# Patient Record
Sex: Female | Born: 1970
Health system: Southern US, Community
[De-identification: ages and names within clinical notes are randomized; demographics above are authoritative.]

## PROBLEM LIST (undated history)

## (undated) DIAGNOSIS — F419 Anxiety disorder, unspecified: Secondary | ICD-10-CM

## (undated) DIAGNOSIS — F329 Major depressive disorder, single episode, unspecified: Secondary | ICD-10-CM

## (undated) DIAGNOSIS — F32A Depression, unspecified: Secondary | ICD-10-CM

## (undated) DIAGNOSIS — E079 Disorder of thyroid, unspecified: Secondary | ICD-10-CM

## (undated) DIAGNOSIS — G43909 Migraine, unspecified, not intractable, without status migrainosus: Secondary | ICD-10-CM

## (undated) DIAGNOSIS — R42 Dizziness and giddiness: Secondary | ICD-10-CM

## (undated) DIAGNOSIS — K589 Irritable bowel syndrome without diarrhea: Secondary | ICD-10-CM

## (undated) HISTORY — PX: ABDOMINAL HYSTERECTOMY: SHX81

## (undated) HISTORY — PX: APPENDECTOMY: SHX54

## (undated) HISTORY — PX: KNEE ARTHROSCOPY: SUR90

## (undated) HISTORY — PX: CHOLECYSTECTOMY: SHX55

---

## 2001-08-25 ENCOUNTER — Emergency Department (HOSPITAL_COMMUNITY): Admission: EM | Admit: 2001-08-25 | Discharge: 2001-08-25 | Payer: Self-pay | Admitting: Internal Medicine

## 2002-08-02 ENCOUNTER — Encounter: Payer: Self-pay | Admitting: Internal Medicine

## 2002-08-02 ENCOUNTER — Ambulatory Visit (HOSPITAL_COMMUNITY): Admission: RE | Admit: 2002-08-02 | Discharge: 2002-08-02 | Payer: Self-pay | Admitting: Internal Medicine

## 2002-08-10 ENCOUNTER — Inpatient Hospital Stay (HOSPITAL_COMMUNITY): Admission: RE | Admit: 2002-08-10 | Discharge: 2002-08-11 | Payer: Self-pay | Admitting: Internal Medicine

## 2004-06-25 ENCOUNTER — Emergency Department (HOSPITAL_COMMUNITY): Admission: EM | Admit: 2004-06-25 | Discharge: 2004-06-25 | Payer: Self-pay | Admitting: Emergency Medicine

## 2004-07-04 ENCOUNTER — Ambulatory Visit (HOSPITAL_COMMUNITY): Admission: RE | Admit: 2004-07-04 | Discharge: 2004-07-04 | Payer: Self-pay | Admitting: Orthopedic Surgery

## 2004-07-22 ENCOUNTER — Encounter (HOSPITAL_COMMUNITY): Admission: RE | Admit: 2004-07-22 | Discharge: 2004-08-21 | Payer: Self-pay | Admitting: Orthopedic Surgery

## 2004-08-27 ENCOUNTER — Ambulatory Visit (HOSPITAL_BASED_OUTPATIENT_CLINIC_OR_DEPARTMENT_OTHER): Admission: RE | Admit: 2004-08-27 | Discharge: 2004-08-27 | Payer: Self-pay | Admitting: Orthopedic Surgery

## 2005-07-15 ENCOUNTER — Emergency Department (HOSPITAL_COMMUNITY): Admission: EM | Admit: 2005-07-15 | Discharge: 2005-07-15 | Payer: Self-pay | Admitting: Emergency Medicine

## 2005-07-30 ENCOUNTER — Emergency Department (HOSPITAL_COMMUNITY): Admission: EM | Admit: 2005-07-30 | Discharge: 2005-07-30 | Payer: Self-pay | Admitting: Emergency Medicine

## 2005-09-11 ENCOUNTER — Encounter (INDEPENDENT_AMBULATORY_CARE_PROVIDER_SITE_OTHER): Payer: Self-pay | Admitting: Internal Medicine

## 2005-11-11 ENCOUNTER — Ambulatory Visit: Payer: Self-pay | Admitting: Internal Medicine

## 2006-11-11 ENCOUNTER — Emergency Department (HOSPITAL_COMMUNITY): Admission: EM | Admit: 2006-11-11 | Discharge: 2006-11-11 | Payer: Self-pay | Admitting: Emergency Medicine

## 2007-07-11 ENCOUNTER — Inpatient Hospital Stay (HOSPITAL_COMMUNITY): Admission: EM | Admit: 2007-07-11 | Discharge: 2007-07-14 | Payer: Self-pay | Admitting: Emergency Medicine

## 2007-07-12 ENCOUNTER — Ambulatory Visit: Payer: Self-pay | Admitting: Cardiology

## 2007-08-15 ENCOUNTER — Emergency Department (HOSPITAL_COMMUNITY): Admission: EM | Admit: 2007-08-15 | Discharge: 2007-08-15 | Payer: Self-pay | Admitting: Emergency Medicine

## 2007-11-30 ENCOUNTER — Inpatient Hospital Stay (HOSPITAL_COMMUNITY): Admission: EM | Admit: 2007-11-30 | Discharge: 2007-12-04 | Payer: Self-pay | Admitting: Emergency Medicine

## 2007-12-01 ENCOUNTER — Ambulatory Visit: Payer: Self-pay | Admitting: Gastroenterology

## 2007-12-03 ENCOUNTER — Ambulatory Visit: Payer: Self-pay | Admitting: Gastroenterology

## 2007-12-04 ENCOUNTER — Inpatient Hospital Stay (HOSPITAL_COMMUNITY): Admission: EM | Admit: 2007-12-04 | Discharge: 2007-12-08 | Payer: Self-pay | Admitting: Emergency Medicine

## 2007-12-05 ENCOUNTER — Ambulatory Visit: Payer: Self-pay | Admitting: Internal Medicine

## 2007-12-05 ENCOUNTER — Ambulatory Visit: Payer: Self-pay | Admitting: Gastroenterology

## 2007-12-06 ENCOUNTER — Encounter: Payer: Self-pay | Admitting: Gastroenterology

## 2007-12-07 ENCOUNTER — Ambulatory Visit: Payer: Self-pay | Admitting: Gastroenterology

## 2007-12-17 ENCOUNTER — Emergency Department (HOSPITAL_COMMUNITY): Admission: EM | Admit: 2007-12-17 | Discharge: 2007-12-17 | Payer: Self-pay | Admitting: Emergency Medicine

## 2007-12-25 ENCOUNTER — Emergency Department (HOSPITAL_COMMUNITY): Admission: EM | Admit: 2007-12-25 | Discharge: 2007-12-26 | Payer: Self-pay | Admitting: Emergency Medicine

## 2007-12-27 ENCOUNTER — Emergency Department (HOSPITAL_COMMUNITY): Admission: EM | Admit: 2007-12-27 | Discharge: 2007-12-28 | Payer: Self-pay | Admitting: Emergency Medicine

## 2007-12-28 ENCOUNTER — Encounter: Payer: Self-pay | Admitting: Gastroenterology

## 2007-12-28 ENCOUNTER — Ambulatory Visit (HOSPITAL_COMMUNITY): Admission: RE | Admit: 2007-12-28 | Discharge: 2007-12-28 | Payer: Self-pay | Admitting: Gastroenterology

## 2007-12-28 ENCOUNTER — Ambulatory Visit: Payer: Self-pay | Admitting: Gastroenterology

## 2007-12-29 ENCOUNTER — Ambulatory Visit (HOSPITAL_COMMUNITY): Admission: RE | Admit: 2007-12-29 | Discharge: 2007-12-29 | Payer: Self-pay | Admitting: Gastroenterology

## 2007-12-29 ENCOUNTER — Ambulatory Visit: Payer: Self-pay | Admitting: Gastroenterology

## 2008-03-28 ENCOUNTER — Emergency Department (HOSPITAL_COMMUNITY): Admission: EM | Admit: 2008-03-28 | Discharge: 2008-03-28 | Payer: Self-pay | Admitting: Emergency Medicine

## 2008-04-13 ENCOUNTER — Emergency Department (HOSPITAL_COMMUNITY): Admission: EM | Admit: 2008-04-13 | Discharge: 2008-04-13 | Payer: Self-pay | Admitting: Emergency Medicine

## 2008-04-23 ENCOUNTER — Ambulatory Visit (HOSPITAL_COMMUNITY): Admission: RE | Admit: 2008-04-23 | Discharge: 2008-04-23 | Payer: Self-pay | Admitting: General Surgery

## 2008-04-23 ENCOUNTER — Encounter (INDEPENDENT_AMBULATORY_CARE_PROVIDER_SITE_OTHER): Payer: Self-pay | Admitting: General Surgery

## 2008-05-19 ENCOUNTER — Emergency Department (HOSPITAL_COMMUNITY): Admission: EM | Admit: 2008-05-19 | Discharge: 2008-05-19 | Payer: Self-pay | Admitting: Emergency Medicine

## 2008-05-29 ENCOUNTER — Emergency Department (HOSPITAL_COMMUNITY): Admission: EM | Admit: 2008-05-29 | Discharge: 2008-05-29 | Payer: Self-pay | Admitting: Emergency Medicine

## 2008-06-28 ENCOUNTER — Emergency Department (HOSPITAL_COMMUNITY): Admission: EM | Admit: 2008-06-28 | Discharge: 2008-06-28 | Payer: Self-pay | Admitting: Emergency Medicine

## 2008-07-19 ENCOUNTER — Emergency Department (HOSPITAL_COMMUNITY): Admission: EM | Admit: 2008-07-19 | Discharge: 2008-07-19 | Payer: Self-pay | Admitting: Emergency Medicine

## 2008-09-10 ENCOUNTER — Emergency Department (HOSPITAL_COMMUNITY): Admission: EM | Admit: 2008-09-10 | Discharge: 2008-09-10 | Payer: Self-pay | Admitting: Emergency Medicine

## 2008-10-27 ENCOUNTER — Emergency Department (HOSPITAL_COMMUNITY): Admission: EM | Admit: 2008-10-27 | Discharge: 2008-10-27 | Payer: Self-pay | Admitting: Internal Medicine

## 2008-12-17 ENCOUNTER — Emergency Department (HOSPITAL_COMMUNITY): Admission: EM | Admit: 2008-12-17 | Discharge: 2008-12-17 | Payer: Self-pay | Admitting: Emergency Medicine

## 2009-08-15 ENCOUNTER — Emergency Department (HOSPITAL_COMMUNITY): Admission: EM | Admit: 2009-08-15 | Discharge: 2009-08-15 | Payer: Self-pay | Admitting: Emergency Medicine

## 2009-09-15 ENCOUNTER — Emergency Department (HOSPITAL_COMMUNITY): Admission: EM | Admit: 2009-09-15 | Discharge: 2009-09-15 | Payer: Self-pay | Admitting: Emergency Medicine

## 2010-03-17 ENCOUNTER — Emergency Department (HOSPITAL_COMMUNITY): Admission: EM | Admit: 2010-03-17 | Discharge: 2010-03-17 | Payer: Self-pay | Admitting: Emergency Medicine

## 2010-03-20 ENCOUNTER — Emergency Department (HOSPITAL_COMMUNITY): Admission: EM | Admit: 2010-03-20 | Discharge: 2010-03-20 | Payer: Self-pay | Admitting: Emergency Medicine

## 2010-07-08 ENCOUNTER — Emergency Department (HOSPITAL_COMMUNITY)
Admission: EM | Admit: 2010-07-08 | Discharge: 2010-07-09 | Payer: Self-pay | Source: Home / Self Care | Admitting: Emergency Medicine

## 2010-07-09 LAB — DIFFERENTIAL
Basophils Absolute: 0.1 10*3/uL (ref 0.0–0.1)
Basophils Relative: 1 % (ref 0–1)
Eosinophils Absolute: 0.3 10*3/uL (ref 0.0–0.7)
Eosinophils Relative: 4 % (ref 0–5)
Lymphocytes Relative: 54 % — ABNORMAL HIGH (ref 12–46)
Lymphs Abs: 4.2 10*3/uL — ABNORMAL HIGH (ref 0.7–4.0)
Monocytes Absolute: 0.5 10*3/uL (ref 0.1–1.0)
Monocytes Relative: 6 % (ref 3–12)
Neutro Abs: 2.8 10*3/uL (ref 1.7–7.7)
Neutrophils Relative %: 36 % — ABNORMAL LOW (ref 43–77)

## 2010-07-09 LAB — URINALYSIS, ROUTINE W REFLEX MICROSCOPIC
Bilirubin Urine: NEGATIVE
Hgb urine dipstick: NEGATIVE
Ketones, ur: NEGATIVE mg/dL
Nitrite: NEGATIVE
Protein, ur: NEGATIVE mg/dL
Specific Gravity, Urine: 1.01 (ref 1.005–1.030)
Urine Glucose, Fasting: NEGATIVE mg/dL
Urobilinogen, UA: 0.2 mg/dL (ref 0.0–1.0)
pH: 7.5 (ref 5.0–8.0)

## 2010-07-09 LAB — CBC
HCT: 36.3 % (ref 36.0–46.0)
Hemoglobin: 13 g/dL (ref 12.0–15.0)
MCH: 32.6 pg (ref 26.0–34.0)
MCHC: 35.8 g/dL (ref 30.0–36.0)
MCV: 91 fL (ref 78.0–100.0)
Platelets: 213 10*3/uL (ref 150–400)
RBC: 3.99 MIL/uL (ref 3.87–5.11)
RDW: 12.9 % (ref 11.5–15.5)
WBC: 7.7 10*3/uL (ref 4.0–10.5)

## 2010-07-09 LAB — COMPREHENSIVE METABOLIC PANEL
ALT: 21 U/L (ref 0–35)
AST: 20 U/L (ref 0–37)
Albumin: 4.3 g/dL (ref 3.5–5.2)
Alkaline Phosphatase: 53 U/L (ref 39–117)
BUN: 15 mg/dL (ref 6–23)
CO2: 26 mEq/L (ref 19–32)
Calcium: 9.8 mg/dL (ref 8.4–10.5)
Chloride: 106 mEq/L (ref 96–112)
Creatinine, Ser: 1.15 mg/dL (ref 0.4–1.2)
GFR calc Af Amer: >60 mL/min (ref >60)
GFR calc non Af Amer: 53 mL/min — ABNORMAL LOW (ref >60)
Glucose, Bld: 93 mg/dL (ref 70–99)
Potassium: 3.9 mEq/L (ref 3.5–5.1)
Sodium: 142 mEq/L (ref 135–145)
Total Bilirubin: 0.6 mg/dL (ref 0.3–1.2)
Total Protein: 6.8 g/dL (ref 6.0–8.3)

## 2010-07-09 LAB — PREGNANCY, URINE: Preg Test, Ur: NEGATIVE

## 2010-07-09 LAB — LIPASE, BLOOD: Lipase: 24 U/L (ref 11–59)

## 2010-09-04 LAB — URINALYSIS, ROUTINE W REFLEX MICROSCOPIC
Glucose, UA: NEGATIVE mg/dL
Leukocytes, UA: NEGATIVE
Protein, ur: NEGATIVE mg/dL
Urobilinogen, UA: 0.2 mg/dL (ref 0.0–1.0)

## 2010-09-04 LAB — CBC
HCT: 36.5 % (ref 36.0–46.0)
Hemoglobin: 12.9 g/dL (ref 12.0–15.0)
WBC: 6.2 10*3/uL (ref 4.0–10.5)

## 2010-09-04 LAB — DIFFERENTIAL
Basophils Absolute: 0 10*3/uL (ref 0.0–0.1)
Lymphocytes Relative: 39 % (ref 12–46)
Monocytes Absolute: 0.4 10*3/uL (ref 0.1–1.0)
Monocytes Relative: 6 % (ref 3–12)
Neutro Abs: 3.1 10*3/uL (ref 1.7–7.7)

## 2010-09-04 LAB — BASIC METABOLIC PANEL
GFR calc non Af Amer: >60 mL/min (ref >60)
Potassium: 3.6 mEq/L (ref 3.5–5.1)
Sodium: 137 mEq/L (ref 135–145)

## 2010-09-04 LAB — URINE CULTURE: Culture: NO GROWTH

## 2010-09-04 LAB — URINE MICROSCOPIC-ADD ON

## 2010-09-10 LAB — BASIC METABOLIC PANEL
BUN: 10 mg/dL (ref 6–23)
Chloride: 106 mEq/L (ref 96–112)
Glucose, Bld: 107 mg/dL — ABNORMAL HIGH (ref 70–99)
Potassium: 4.1 mEq/L (ref 3.5–5.1)

## 2010-09-10 LAB — DIFFERENTIAL
Eosinophils Absolute: 0.2 10*3/uL (ref 0.0–0.7)
Eosinophils Relative: 2 % (ref 0–5)
Lymphs Abs: 2.4 10*3/uL (ref 0.7–4.0)
Monocytes Absolute: 0.5 10*3/uL (ref 0.1–1.0)

## 2010-09-10 LAB — CBC
HCT: 40.5 % (ref 36.0–46.0)
Hemoglobin: 14 g/dL (ref 12.0–15.0)
MCV: 95.8 fL (ref 78.0–100.0)
Platelets: 239 10*3/uL (ref 150–400)
WBC: 8.8 10*3/uL (ref 4.0–10.5)

## 2010-09-13 ENCOUNTER — Emergency Department (HOSPITAL_COMMUNITY)
Admission: EM | Admit: 2010-09-13 | Discharge: 2010-09-13 | Disposition: A | Discharge status: Home / Self Care | Payer: Medicaid Other | Attending: Emergency Medicine | Admitting: Emergency Medicine

## 2010-09-13 ENCOUNTER — Emergency Department (HOSPITAL_COMMUNITY): Payer: Medicaid Other

## 2010-09-13 DIAGNOSIS — W010XXA Fall on same level from slipping, tripping and stumbling without subsequent striking against object, initial encounter: Secondary | ICD-10-CM | POA: Insufficient documentation

## 2010-09-13 DIAGNOSIS — S52043A Displaced fracture of coronoid process of unspecified ulna, initial encounter for closed fracture: Secondary | ICD-10-CM | POA: Insufficient documentation

## 2010-09-13 DIAGNOSIS — Y92009 Unspecified place in unspecified non-institutional (private) residence as the place of occurrence of the external cause: Secondary | ICD-10-CM | POA: Insufficient documentation

## 2010-09-14 LAB — RAPID STREP SCREEN (MED CTR MEBANE ONLY): Streptococcus, Group A Screen (Direct): NEGATIVE

## 2010-09-30 LAB — URINALYSIS, ROUTINE W REFLEX MICROSCOPIC
Glucose, UA: NEGATIVE mg/dL
Leukocytes, UA: NEGATIVE
Nitrite: NEGATIVE
Protein, ur: NEGATIVE mg/dL
pH: 5.5 (ref 5.0–8.0)

## 2010-09-30 LAB — BASIC METABOLIC PANEL
BUN: 13 mg/dL (ref 6–23)
Calcium: 9.5 mg/dL (ref 8.4–10.5)
Chloride: 109 mEq/L (ref 96–112)
Creatinine, Ser: 1.1 mg/dL (ref 0.4–1.2)
GFR calc Af Amer: >60 mL/min (ref >60)
GFR calc non Af Amer: 56 mL/min — ABNORMAL LOW (ref >60)

## 2010-09-30 LAB — PREGNANCY, URINE: Preg Test, Ur: NEGATIVE

## 2010-09-30 LAB — CBC
Platelets: 236 10*3/uL (ref 150–400)
RBC: 4.13 MIL/uL (ref 3.87–5.11)
WBC: 14.6 10*3/uL — ABNORMAL HIGH (ref 4.0–10.5)

## 2010-09-30 LAB — URINE MICROSCOPIC-ADD ON

## 2010-09-30 LAB — DIFFERENTIAL
Lymphs Abs: 2.6 10*3/uL (ref 0.7–4.0)
Monocytes Relative: 1 % — ABNORMAL LOW (ref 3–12)
Neutro Abs: 11.5 10*3/uL — ABNORMAL HIGH (ref 1.7–7.7)
Neutrophils Relative %: 79 % — ABNORMAL HIGH (ref 43–77)

## 2010-09-30 LAB — RAPID URINE DRUG SCREEN, HOSP PERFORMED
Amphetamines: NOT DETECTED
Benzodiazepines: POSITIVE — AB
Tetrahydrocannabinol: NOT DETECTED

## 2010-09-30 LAB — ETHANOL: Alcohol, Ethyl (B): 138 mg/dL — ABNORMAL HIGH (ref 0–10)

## 2010-10-06 LAB — RAPID URINE DRUG SCREEN, HOSP PERFORMED
Amphetamines: NOT DETECTED
Barbiturates: NOT DETECTED
Benzodiazepines: POSITIVE — AB
Cocaine: NOT DETECTED
Opiates: NOT DETECTED

## 2010-10-06 LAB — BASIC METABOLIC PANEL
CO2: 24 mEq/L (ref 19–32)
Chloride: 108 mEq/L (ref 96–112)
Creatinine, Ser: 0.97 mg/dL (ref 0.4–1.2)
GFR calc Af Amer: >60 mL/min (ref >60)
Potassium: 4.4 mEq/L (ref 3.5–5.1)
Sodium: 144 mEq/L (ref 135–145)

## 2010-10-06 LAB — ETHANOL: Alcohol, Ethyl (B): 51 mg/dL — ABNORMAL HIGH (ref 0–10)

## 2010-10-06 LAB — PREGNANCY, URINE: Preg Test, Ur: NEGATIVE

## 2010-10-20 ENCOUNTER — Emergency Department (HOSPITAL_COMMUNITY): Payer: Medicaid Other

## 2010-10-20 ENCOUNTER — Emergency Department (HOSPITAL_COMMUNITY)
Admission: EM | Admit: 2010-10-20 | Discharge: 2010-10-20 | Disposition: A | Discharge status: Home / Self Care | Payer: Medicaid Other | Attending: Emergency Medicine | Admitting: Emergency Medicine

## 2010-10-20 DIAGNOSIS — F341 Dysthymic disorder: Secondary | ICD-10-CM | POA: Insufficient documentation

## 2010-10-20 DIAGNOSIS — E039 Hypothyroidism, unspecified: Secondary | ICD-10-CM | POA: Insufficient documentation

## 2010-10-20 DIAGNOSIS — Z79899 Other long term (current) drug therapy: Secondary | ICD-10-CM | POA: Insufficient documentation

## 2010-10-20 DIAGNOSIS — W19XXXA Unspecified fall, initial encounter: Secondary | ICD-10-CM | POA: Insufficient documentation

## 2010-10-20 DIAGNOSIS — S5010XA Contusion of unspecified forearm, initial encounter: Secondary | ICD-10-CM | POA: Insufficient documentation

## 2010-10-28 ENCOUNTER — Ambulatory Visit (HOSPITAL_COMMUNITY): Payer: Medicaid Other | Admitting: Physical Therapy

## 2010-11-04 NOTE — Op Note (Signed)
NAMEJAXIE, [name]              ACCOUNT NO.:  [id number]   MEDICAL RECORD NO.:  [id number]          PATIENT TYPE:  AMB   LOCATION:  DAY                           FACILITY:  [facility]   PHYSICIAN:  Kassie Mends, M.D.      DATE OF BIRTH:  12/12/1970   DATE OF PROCEDURE:  12/29/2007  DATE OF DISCHARGE:                                PROCEDURE NOTE   PROCEDURE:  Colonoscopy.   INDICATIONS FOR EXAM:  [name] is a 40 year old female who presents  with diarrhea and rectal bleeding.  Yesterday, her colonoscopy had a  poor prep in the left colon and polyps less than 1 cm would have been  easily missed, and she needed a complete colonoscopy with adequate  visualization to evaluate for rectal bleeding.   FINDINGS:  1. Normal colon without evidence of polyps, mass, inflammatory      changes, and diverticular AVMs.  Improved visualization of both the      right and the left colon on today.  2. Moderate internal hemorrhoids.  Otherwise, normal retroflex view of      the rectum.   DIAGNOSES:  1. Rectal bleeding secondary to hemorrhoids.  2. No obvious source for diarrhea identified, biopsy is pending from      December 28, 2007.   RECOMMENDATIONS:  1. [name] was given her discharge instructions in writing for the      management of her diarrhea and rectal bleeding.  She should use      Bentyl 10 mg 2-3 times a day until her current prescription runs      out, and then she should begin her new prescription of 20 mg 3      times a day.  She will increase her Elavil to 25 mg 2 at night      until that prescription runs out and then began Elavil 50 mg      nightly.  2. She will add Benefiber once a day for 1 week and then twice daily.      She was cautioned that it can cause bloating and she should call me      if this happens.  3. She should follow a high-fiber diet.  She was given a handout on      high-fiber diet.  4. She will began [facility] rectal suppositories every 12 hours for 10  days to treat hemorrhoidal bleeding.  5. Continue Prilosec.  6. Follow up in 3 months, but I will call her next week with the      results of her biopsies.  7. She should continue her medication and management for anxiety and      stress.  I have explained to [name] that due the stress in her      life, her gastrointestinal system is disturbed, and we can try to      manage her symptoms with medicines.  I think that she has a      functional gut disorder.  8. Next colonoscopy in 15 years with propofol.  She required high  doses of Demerol, Versed, and the additional Phenergan in order to      be adequately sedated for her procedure.   MEDICATIONS:  1. Demerol 150 mg IV.  2. Versed 8 mg IV.  3. Phenergan 25 mg IV.   PROCEDURE TECHNIQUE:  Physical exam was performed.  Informed consent was  obtained from the patient after explaining the benefits, risks, and  alternatives to the procedure.  The patient was connected to the monitor  and placed in left lateral position.  Continuous oxygen was provided by  nasal cannula and IV medicine administered through an indwelling  cannula.  After administration of sedation and rectal exam, the  patient's rectum was intubated and the scope was advanced under direct  visualization to the cecum.  The scope was removed slowly by carefully  examining the color, texture, anatomy, and integrity of the mucosa on  the way out.  The patient was recovered in endoscopy and discharged home  in satisfactory condition.      Kassie Mends, M.D.  Electronically Signed     SM/MEDQ  D:  12/29/2007  T:  12/30/2007  Job:  [id number]

## 2010-11-04 NOTE — Group Therapy Note (Signed)
[name], [name]              ACCOUNT NO.:  [id number]   MEDICAL RECORD NO.:  [id number]          PATIENT TYPE:  INP   LOCATION:  A212                          FACILITY:  [facility]   PHYSICIAN:  Osvaldo Shipper, MD     DATE OF BIRTH:  Nov 14, 1970   DATE OF PROCEDURE:  07/12/2007  DATE OF DISCHARGE:                                 PROGRESS NOTE   SUBJECTIVE:  Patient is feeling better.  She thinks the strength in her  left arm has almost come back to her usual.  The strength in her left  leg is still lagging behind.  Does not have any other symptoms at this  time.  No chest pain.  No shortness of breath.   OBJECTIVE:  VITAL SIGNS:  All stable.  At this time, blood pressure is  124/92, saturation 92% on room air.  LUNGS:  Clear to auscultation bilaterally.  CARDIOVASCULAR:  S1, S2 is normal, regular.  No murmur is appreciated.  ABDOMEN:  Soft, nontender, nondistended.  Bowel sounds are present.  No  mass or organomegaly is appreciated.  EXTREMITIES:  No edema.  NEUROLOGIC:  Strength in the left upper extremity is almost 5/5 now.  Left lower extremity is still about 4/5.  No cranial nerve deficits  present.   LABORATORY DATA:  No labs available this morning except for a PT/PTT  which is within reasonable limits.  She did have an EKG done yesterday  which shows a sinus rhythm with a normal axis.  The intervals appear to  be within the normal range.  No definite Q-wave is appreciated.  There  is evidence for early depolarization.  There are some nonspecific T-wave  changes present, especially in V1-3, with the T-inversions.   ASSESSMENT/PLAN:  1. Left hemiparesis, either as a result of migraines or seizures or      stroke.  We are awaiting MRI, awaiting chest x-ray, awaiting      neurological consultation.  Other studies, such as echocardiogram      and Dopplers, also pending at this time.  EEG is also pending.  PT      evaluation will also be done.  2. Transient chest pain.  We will  check one set of cardiac markers.      She has not had any chest pains since last night.  EKG does not      show any acute findings.  There are some nonspecific findings.  We      will repeat EKG tomorrow morning as well.  3. History of depression, anxiety, panic attacks; all stable.  4. She continues to be on deep venous thrombosis/gastrointestinal      prophylaxis.  She is on aspirin.  A statin will be started if there      is evidence of a stroke.  Blood pressure is under reasonable      control.  Basically, we are awaiting further neurological workup      before this patient can be discharged.      Osvaldo Shipper, MD  Electronically Signed    GK/MEDQ  D:  07/12/2007  T:  07/12/2007  Job:  [id number]

## 2010-11-04 NOTE — Op Note (Signed)
NAMEBRIGHTON, DELIO              ACCOUNT NO.:  [id number]   MEDICAL RECORD NO.:  [id number]          PATIENT TYPE:  AMB   LOCATION:  DAY                           FACILITY:  [facility]   PHYSICIAN:  Tilford Pillar, MD      DATE OF BIRTH:  1971/03/17   DATE OF PROCEDURE:  04/23/2008  DATE OF DISCHARGE:                               OPERATIVE REPORT   PREOPERATIVE DIAGNOSIS:  Prolapsing hemorrhoids.   POSTOPERATIVE DIAGNOSIS:  Prolapsing hemorrhoids.   PROCEDURE:  Hemorrhoidectomy x2.   SURGEON:  Tilford Pillar, MD   ANESTHESIA:  MAC with IV with local anesthesia.  Local anesthetic, 1%  lidocaine with epinephrine.   SPECIMENS:  Hemorrhoidal tissue x2.   ESTIMATED BLOOD LOSS:  Minimal.   INDICATIONS:  The patient is a 40 year old female with a longstanding  history of known hemorrhoidal disease.  She states this had been going  on for approximately 14 years, but over the last several months, she has  had increasing symptoms and difficulties with her hemorrhoids.  She has  flare-ups several times a week and is currently having no resolution of  her symptoms with conservative management with preparation H, sitz bath,  and stool softeners.  These have helped in the past, but currently are  not of any benefit.  The risks, benefits, and alternatives of the  hemorrhoidectomy were discussed at length with the patient including the  risk of bleeding and infection.  At the time of her evaluation, there  were two separate hemorrhoidal columns that were noted to be dilated at  her initial office visit.  Additionally, the patient does have a history  of hemiparesis and paresthesias on her left side and this is suspected  secondarily due to some cerebral plaques possibly consistent with an  early MS presentation.  I did discuss with the patient the operation in  detail, and the patient's questions and concerns were addressed as the  patient wished to continue with the planned procedure.   OPERATION:  The patient was taken to the operating room, was placed in  the supine position on the operating table at which time the sedation  was administered.  Once the patient was asleep, she had a laryngeal mask  airway placed.  She was then placed into bilateral Yellofin stirrups and  placed into high lithotomy position.  Her perineum was then prepped with  Betadine solution.  Drains were placed in standard fashion.  At this  point, a digital exam of the rectum did not elicit any additional  masses.  Two prolapsing hemorrhoidal columns were noted.  One at  approximately the 11 o'clock position and one at approximately the 3  o'clock position.  Local anesthetic was instilled and then an anal  retractor was inserted.  The hemorrhoidal tissue was excised at the  anterior positioned hemorrhoidal tissue.  The mucosa was stripped and  the hemorrhoidal venous plexus was excised.  Hemostasis was obtained  using electrocautery and then a 3-0 chromic was utilized in a running  locking fashion to reapproximate the rectal mucosa.  This resulted in  excellent hemostasis and  attention was turned to the other hemorrhoidal  tissue.   Then using similar fashion, the hemorrhoidal tissue was grasped with an  Allis clamp, was scored with the scalpel, and additional dissection was  carried out using electrocautery to remove the hemorrhoidal tissue.  Once free, both of the specimens were placed on the back table and sent  as permanent specimen to pathology.  Similarly, a 3-0 chromic suture was  utilized to reapproximate the rectal mucosa in a running locking  fashion.  Hemostasis was excellent at this time.  A Ray-Tec sponge which  had been used as distal packing was removed.  Palpation of the anal  opening and rectal vault did not demonstrate any stenosis or tightening.  Hemostasis was good.  At this time, a piece of Gelfoam was wrapped in  Surgicel.  A suture was pexed upon this.  Viscous lidocaine was  applied  and this was used as a tampon to the rectum, the tails easily being  presented for removal before discharge.  The perirectal area was washed  with a moistened dry towel.  Sterile 4 x 4 gauze was rolled and placed  over the opening and secured with an ABD and tape.  These were  ultimately secured with mesh underwear which was placed after the  patient was removed out of free Yellofin stirrups.  At this point, the  patient was allowed to come out of sedation.  She was transferred back  to regular hospital bed and transferred to postanesthetic care unit in  stable condition.  At the conclusion of the procedure, all instrument,  sponge, and needle counts were correct.  The patient tolerated the  procedure well.      Tilford Pillar, MD  Electronically Signed     BZ/MEDQ  D:  04/23/2008  T:  04/23/2008  Job:  [id number]   cc:   Dr. [name]

## 2010-11-04 NOTE — Discharge Summary (Signed)
NAMESTEPHANIEANN, [name]              ACCOUNT NO.:  [id number]   MEDICAL RECORD NO.:  [id number]          PATIENT TYPE:  INP   LOCATION:  A309                          FACILITY:  [facility]   PHYSICIAN:  Osvaldo Shipper, MD     DATE OF BIRTH:  1971/04/22   DATE OF ADMISSION:  12/04/2007  DATE OF DISCHARGE:  06/18/2009LH                               DISCHARGE SUMMARY   Please review H and P dictated by Dr. Luciana Axe for details regarding the  patient's presenting illness.   PRIMARY CARE PHYSICIAN:  The patient does not have a PMD.  She follows  with Dr. Kassie Mends, gastroenterologist.   DISCHARGE DIAGNOSES:  1. Chronic abdominal pain, unclear etiology, possibly irritable bowel      syndrome.  2. Nausea and vomiting, unclear etiology, resolved.  3. Hypothyroidism.  4. Depression.   BRIEF HOSPITAL COURSE:  Briefly, this is a 40 year old Caucasian female  who initially presented on June 10 with complaints of abdominal pain,  nausea and vomiting.  She was felt to have possible gastroenteritis.  She underwent a CT scan of the abdomen and pelvis with contrast which  did not show any acute abnormalities in the abdomen.  Abnormal wall  thickening noted in the small bowel loops in the pelvis, otherwise  nothing specific.  The patient was seen by GI at that time.  No other  procedures were performed and the patient was subsequently discharged  home on June 14.  However, she presented the same night with recurrence  of her symptoms.  She underwent a repeat CAT scan which again was  unremarkable and the small bowel wall thickening noted on the previous  CT had resolved.  The patient was admitted for symptomatic treatment.  She was put on antiemetics and proton pump inhibitor.  She underwent EGD  which showed mild gastritis and duodenitis.  GI could not find a  specific etiology for her symptoms.  They felt that she could have  irritable bowel syndrome and she was started on Bentyl.  She was also  started on Elavil.  Her Helicobacter pylori did come back 6.7.  The rest  of her labs were pretty unremarkable.  She did have mildly positive  LFTs.  Etiology of that is not clear.  Liver did not show any focal  lesions on CAT scan.   For the past couple of days the patient has been tolerating p.o. intake.  She does have nausea and some pain but not quite as severe as it was.  Vital signs are all stable.  No new complaints offered this morning.  Examination is benign.  She is stable for discharge.   DISCHARGE MEDICATIONS:  1. Prevpac one dose pack.  2. Prilosec 20 mg p.o. daily once she has completed a course of      Prevpac.  3. Flora-Q one tablet daily.  4. Elavil 25 mg at bedtime.  5. Bentyl 10 mg t.i.d.  6. Phenergan 12.5 mg every 6 hours as needed for nausea.  7. Xanax 0.5 mg as needed.  8. Synthroid 25 mcg daily.  9. Citalopram 20 mg  daily.  10.Aspirin to be started not before July 17.  11.Zofran 4 mg as needed.   FOLLOWUP:  With Dr. Kassie Mends in a month.   She does not have a PMD.  We have encouraged her to find a PMD.   DIET:  Lactose free low residue diet.   PHYSICAL ACTIVITY:  No restrictions.   Total time on this discharge was 35 minutes.      Osvaldo Shipper, MD  Electronically Signed     GK/MEDQ  D:  12/08/2007  T:  12/08/2007  Job:  [id number]   cc:   Kassie Mends, M.D.  [address]  [place] , [place] [address]

## 2010-11-04 NOTE — Discharge Summary (Signed)
[name], [name]              ACCOUNT NO.:  [id number]   MEDICAL RECORD NO.:  [id number]          PATIENT TYPE:  INP   LOCATION:  A307                          FACILITY:  [facility]   PHYSICIAN:  Lucita Ferrara, MD         DATE OF BIRTH:  01-10-1971   DATE OF ADMISSION:  11/30/2007  DATE OF DISCHARGE:  LH                               DISCHARGE SUMMARY   DISCHARGE DIAGNOSES:  1. Acute gastroenteritis.  2. Fevers, chills.  3. Nausea and vomiting  4. Diarrhea.   CONSULTANTS:  Dr. Cira Servant and Dr. [name] from gastroenterology services.   CONDITION UPON DISCHARGE:  Stable.   PROCEDURES:  The patient had a CT scan of the abdomen and pelvis on November 30, 2007, which showed abnormal enhancement and wall thickening  involving the pelvic small-bowel loops as well as moderate amount of  free fluid.  The finding is concerning for enteritis.  This may be  either inflammatory or infectious.  No evidence of bowel obstruction.   BRIEF HISTORY OF PRESENT ILLNESS AND HOSPITAL COURSE:  [name] is a  pleasant 40 year old who presented to [facility] on November 30, 2007, with acute abdominal pain, nausea and vomiting, and  gastroenteritis, viral versus bacterial picture.  She is status post  cholecystectomy, appendectomy, hysterectomy and salpingo-oophorectomy,  thus narrowing down some of our differential diagnoses.  She was  admitted to the medical floor and a staff gastroenterology consultation  was requested.  She was kept n.p.o. and empirically started on  ciprofloxacin and Flagyl, IV hydration with normal saline and IV  antiemetics were started.  Consultation recommendations were followed  including supportive measures, continuation of Cipro and Flagyl, and  stool for C. difficile and lactoferrin.  Upon following measures, diet  was advanced which she slowly tolerated well.  Her hepatic function was  within normal limits.  Her electrolytes were essentially normal.  Her  lipase was  normal.  Her stool culture was negative, stool for C.  difficile was negative.  Today, she is able to tolerate a full diet that  is lactose-free without any problems.  Will continue Cipro and will  continue probiotics at home.   DISCHARGE MEDICATIONS:  Will be as follows:  1. Xanax 0.5 mg p.o. q.24h. as needed.  2. Synthroid 25 mcg p.o. daily.  3. Citalopram 20 mg p.o. daily.  4. Aspirin 81 mg p.o. daily.  5. Ciprofloxacin 500 mg p.o. b.i.d. x7 days.  6. Probiotics of choice.  7. Colace over-the-counter daily with meals.   FOLLOW-UP:  Will be with her primary care doctor and Dr. Cira Servant in 1  month; she is to schedule the appointment.  She is advised to call with  severe abdominal pain, nausea or vomiting, inability to tolerate p.o.  intake, dizziness, chest pain or shortness of breath.   CONDITION UPON DISCHARGE:  Stable.      Lucita Ferrara, MD  Electronically Signed     RR/MEDQ  D:  12/04/2007  T:  12/04/2007  Job:  [id number]

## 2010-11-04 NOTE — Consult Note (Signed)
NAME:  [name], [name]              ACCOUNT NO.:  [id number]   MEDICAL RECORD NO.:  [id number]          PATIENT TYPE:  INP   LOCATION:  A307                          FACILITY:  [facility]   PHYSICIAN:  Kassie Mends, M.D.      DATE OF BIRTH:  06-03-1971   DATE OF CONSULTATION:  DATE OF DISCHARGE:                                 CONSULTATION   REFERRING PHYSICIAN:  [facility].   HISTORY OF PRESENT ILLNESS:  The patient is a 40 year old Caucasian  female who presented to the emergency department yesterday with acute  onset of abdominal pain, diarrhea which began earlier in the day.  She  describes having excruciating crampy type abdominal pain.  Her pain was  a 10 on a scale of 1-10.  She had had too numerous to count bowel  movements yesterday.  She had a little bright red blood per rectum  especially when she wiped as well.  She does have a chronic history of  bright red blood per rectum that she relates to hemorrhoids.  She denies  any ill contacts.  Her last antibiotic use was after a cholecystectomy  in April 2009.  She has had a low grade fever.  She has had some nausea  but no vomiting.  Denies any cough, runny nose, congestion.  Generally  she has chronic constipation.  However, since her cholecystectomy her  stools have been more regular.  She has a lot of postprandial fecal  urgency with fatty meals.  She denies any chronic abdominal pain.   Upon the evaluation she had a CT of the abdomen and pelvis which  revealed abnormal enhancement and wall thickening involving the pelvic  small bowel loops as well as moderate amount of free fluid.  Her white  count was 12,300.  This morning her white count is down to 9600.   MEDICATIONS AT HOME:  1. Xanax 0.5 mg p.r.n.  2. Synthroid 25 mcg daily.  3. Celexa 20 mg daily.  4. Aspirin 81 mg daily.   ALLERGIES:  MORPHINE.   PAST MEDICAL HISTORY:  Hypothyroidism, depression, anxiety, history of  panic attacks.  She was hospitalized in  January 2009 with one-sided  weakness, and was told that this was either a TIA or a premultiple  sclerosis.  She is going to have a followup MRI in six months because of  a single lesion that was seen previously.  She has had a prior  appendectomy, cholecystectomy, hysterectomy, bilateral salpingo-  oophorectomy, right knee arthroscopy.   FAMILY HISTORY:  Known for chronic GI illnesses, colorectal cancer, IBD.   SOCIAL HISTORY:  She is divorced and has three children.  She smokes 1-  1/2 packs of cigarettes daily.  Occasionally consumes alcohol.  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   REVIEW OF SYSTEMS:  See HPI for GI.  CONSTITUTIONAL:  No weight loss.  CARDIOPULMONARY:  No chest pain or shortness of breath, palpitations or  cough.  GENITOURINARY:  No dysuria, hematuria.   PHYSICAL EXAMINATION:  VITAL SIGNS:  Temp 98.6, pulse 80, respirations  20, blood  pressure 110/66, O2 sats 96% on room air, height 66 inches,  weight 68.6 kg.  GENERAL:  Pleasant well-nourished, well-developed Caucasian female in no  acute distress.  SKIN:  Warm and dry, no jaundice.  HEENT:  Sclerae nonicteric, oropharyngeal mucosa moist and pink.  CHEST:  Clear to auscultation.  CARDIAC:  Reveals regular rate and rhythm, normal S1, S2.  No murmurs,  rubs or gallops.  ABDOMEN:  Positive bowel sounds, soft, nondistended.  She has a moderate  tenderness throughout the lower abdomen to deep palpation.  No rebound  or guarding, no abdominal bruits or hernias, no organomegaly or masses.  EXTREMITIES:  Lower extremities:  No edema.   LABORATORY DATA:  Labs today:  Hemoglobin 12.6, hematocrit 35.7,  platelets 161,000.  Sodium 141, potassium 4.1, BUN 6, creatinine 1.06,  glucose 100.  Lipase 18.  Total bilirubin 0.5, alkaline phosphatase 52.  AST 19, ALT 16.  Albumin 3.8.   IMPRESSION:  The patient is a 40 year old lady who has acute onset of  abdominal cramping with diarrhea and nausea.  CT suggestive  of  abnormality of the pelvic small bowel loops.  Suspect acute enteritis,  and would favor infectious etiology given the acute onset of symptoms.  She also has chronic hematochezia possibly from benign anal rectal  source.  Would offer her an inpatient colonoscopy for this.   RECOMMENDATIONS:  1. Support measures.  2. Continue Cipro.  3. Send stool for culture, C. difficile on p.m. lactoferrin.  4. Further recommendations to further.   I would like to thank the [facility] team for allowing [place] to take part  in the care of this patient.      Tana Coast, P.A.      Kassie Mends, M.D.  Electronically Signed    LL/MEDQ  D:  12/01/2007  T:  12/01/2007  Job:  [id number]

## 2010-11-04 NOTE — H&P (Signed)
[name], [name]              ACCOUNT NO.:  [id number]   MEDICAL RECORD NO.:  [id number]          PATIENT TYPE:  INP   LOCATION:  A307                          FACILITY:  [facility]   PHYSICIAN:  Gardiner Barefoot, MD    DATE OF BIRTH:  Aug 22, 1970   DATE OF ADMISSION:  12/04/2007  DATE OF DISCHARGE:  LH                              HISTORY & PHYSICAL   PRIMARY CARE PHYSICIAN:  Unassigned.   HISTORY OF PRESENT ILLNESS:  This is a 40 year old female, who was  admitted 3 days ago to the hospital with gastroenteritis and small bowel  thickening on CT scan and was discharged this a.m. after tolerating a  full liquid diet and not wanting any further workup.  The patient,  however, returned to the emergency room with continued diarrhea,  abdominal pain, and general poor tolerance of p.o. since leaving the  hospital.  She also reports a subjective fever today, and no other  significant problems.  The patient also complains of  nausea, however,  has not been vomiting today.   PAST MEDICAL HISTORY:  1. Hypothyroidism.  2. Anxiety.  3. Depression.  4. History of possible TIA.  5. Panic disorder.  6. History of appendectomy.  7. History of cholecystectomy.  8. History of total abdominal hysterectomy.  9. Knee replacement.   MEDICATIONS:  1. Cipro.  2. Xanax.  3. Aspirin.  4. Celexa.  5. Synthroid 25 mcg.   ALLERGIES:  MORPHINE.   SOCIAL HISTORY:  The patient is undergoing divorce now, has 3 children,  and continues to smoke more than a pack a day.   FAMILY HISTORY:  Irritable bowel disease and colorectal carcinoma.   REVIEW OF SYSTEMS:  Negative except as per the history of present  illness.   PHYSICAL EXAMINATION:  VITAL SIGNS: Temperature is 98.9, pulse is 90,  respirations 18, blood pressure is 114/74, and O2 sat is 97%.  GENERAL: The patient is awake, alert, and oriented x3, and appears in  mild distress to pain.  CARDIOVASCULAR: Regular rate and rhythm.  No murmurs, rubs,  or gallops.  LUNGS: Clear to auscultation bilaterally.  ABDOMEN: Soft, mild diffuse tenderness, and nonfocal with no rebound or  guarding.  Nondistended with positive bowel sounds.   LABORATORY DATA:  From this a.m. include a normal CMP and CBC with a  mild anemia.  CT scan from 3 days ago shows small-bowel wall thickening.   ASSESSMENT AND PLAN:  Enteritis.  The patient will continue with Cipro,  which we will give IV at this time and continue with IV fluids and pain  control.  Also, we will reconsult Gastroenterology to if there is any  other workup indicated in this patient with a family history of  inflammatory bowel disease.  Also, we will send off for a Clostridium  difficile toxin assay.  Her stool culture is negative at this time.      Gardiner Barefoot, MD  Electronically Signed     RWC/MEDQ  D:  12/04/2007  T:  12/05/2007  Job:  [id number]

## 2010-11-04 NOTE — Procedures (Signed)
[name], [name]              ACCOUNT NO.:  [id number]   MEDICAL RECORD NO.:  [id number]          PATIENT TYPE:  INP   LOCATION:  A212                          FACILITY:  [facility]   PHYSICIAN:  Kofi A. Gerilyn Pilgrim, M.D. DATE OF BIRTH:  10-02-1970   DATE OF PROCEDURE:  DATE OF DISCHARGE:                              EEG INTERPRETATION   This is a 40 year old lady who presents with altered mental status and  dizziness.  Study is done to evaluate for possible seizures.   MEDICATIONS:  1. Celexa.  2. BuSpar.  3. Xanax.  4. Tylenol.  5. Percocet.   A 16-channel recording is conducted for approximately 20 minutes using  standard 10/20 measurements.  There is a posterior rhythm of 10 Hz.  Awake and drowsy activities are recording.  There is no focal or lateral  slowing.  There is no epileptiform activity observed.  Photic  stimulation and hypoventilation were not conducted.   IMPRESSION:  A normal recording of awake and drowsy states.  A single  recording does not rule out epileptic seizures. A sleep deprived  recording may be useful if indicated.      Kofi A. Gerilyn Pilgrim, M.D.  Electronically Signed     KAD/MEDQ  D:  07/14/2007  T:  07/14/2007  Job:  [id number]

## 2010-11-04 NOTE — Group Therapy Note (Signed)
[name], [name]              ACCOUNT NO.:  [id number]   MEDICAL RECORD NO.:  [id number]          PATIENT TYPE:  INP   LOCATION:  A307                          FACILITY:  [facility]   PHYSICIAN:  Lucita Ferrara, MD         DATE OF BIRTH:  1970/12/30   DATE OF PROCEDURE:  12/06/2007  DATE OF DISCHARGE:                                 PROGRESS NOTE   HISTORY OF PRESENT ILLNESS:  Patient examined by bedside today.  She  underwent an EGD, and she is awaiting to undergo a CT scan.  She still  continues to have the same symptoms as prior.  EGD had shown normal  esophagus, no Barrett's, some erythema.   PHYSICAL EXAMINATION:  VITAL SIGNS:  Temperature 97.6, pulse 65,  respirations 16, blood pressure 101/76, pulse ox 97% on room air.  GENERAL:  The patient is a 40 year old female, no acute distress.  CARDIOVASCULAR:  S1-S2.  LUNGS:  Clear to auscultation bilaterally.  ABDOMEN:  Soft, nontender, nondistended, positive bowel sounds.  EXTREMITIES:  No clubbing, cyanosis or edema.  NEUROLOGICAL:  Patient is anxious yet alert and oriented x3.   LABORATORY DATA:  Stool culture negative for Salmonella, shigella and  campylobacter.  Complete metabolic panel normal.  AST and ALT somewhat  elevated mildly at 45 and 43 respectively.  CBC:  Normal.  B-met:  Normal.   ASSESSMENT/PLAN:  1. Recurrent abdominal pain, diarrhea.  2. EGD showing mild gastritis, mild duodenitis.   PLAN:  Will continue per Gastroenterology recommendations.  Continue  PPI.  Continue await C.  Diff cultures.  Continue to see what the CT  scan of the abdomen and pelvis looks like.  The rest of the plans depend  on her progress.      Lucita Ferrara, MD  Electronically Signed     RR/MEDQ  D:  12/06/2007  T:  12/06/2007  Job:  [id number]

## 2010-11-04 NOTE — H&P (Signed)
[name], [name]              ACCOUNT NO.:  [id number]   MEDICAL RECORD NO.:  [id number]          PATIENT TYPE:  INP   LOCATION:  A307                          FACILITY:  [facility]   PHYSICIAN:  Margaretmary Dys, M.D.DATE OF BIRTH:  August 26, 1970   DATE OF ADMISSION:  11/30/2007  DATE OF DISCHARGE:  LH                              HISTORY & PHYSICAL   PRIMARY CARE PHYSICIAN:  Patient is unassigned.   ADMITTING DIAGNOSES:  1. Acute abdominal pain.  2. Nausea with vomiting.  3. Dehydration.  4. Gastroenteritis likely infectious viral, less likely bacteria.      Doubt chronic colitis.   CHIEF COMPLAINT:  Nausea, vomiting, abdominal pain, and diarrhea of one  day duration.   HISTORY OF PRESENT ILLNESS:  [name] is a 40 year old female who  presents to the emergency department with acute abdominal pain mostly  crampy like starting around her periumbilical area radiating to her  lower abdomen.  She describes the pain initially as 10/10 which is  severe.  She also has some nausea and unable to keep any food down.  The  patient has had multiple bowel movements.   The patient is not sure if she saw some blood per rectum, but she states  she did see what appeared to be mucus.   There have been no sick contacts.   She has no headaches or dizziness.  The patient has no respiratory  symptoms, no cough, no shortness of breath, no chest pain.   The patient is status post recent cholecystectomy.  No prior history of  chronic abdominal pain.   Evaluation in the emergency room revealed that patient was slightly  dehydrated.   CT scan of the abdomen showed some thickening involving the small bowel  loops concerning for enteritis.  The patient was, otherwise,  hemodynamically stable.   The patient was subsequently on IV fluid and also pain controlled.   REVIEW OF SYSTEMS:  A 10-point Review of Systems is, otherwise, negative  except as mentioned in the History of Present Illness  above.   PAST MEDICAL HISTORY:  1. Hypothyroidism.  2. Anxiety.  3. Depression.  4. History of panic attacks.  5. History of questionable transient ischemic attack back in January      2009.  6. Abdominal CT scan which was concerning for early multiple      sclerosis.   PAST SURGICAL HISTORY:  1. Status post appendectomy.  2. Status post cholecystectomy.  3. Status post hysterectomy.  4. Bilateral salpingo-oophorectomy.  5. Right knee arthroscopy.   MEDICATIONS:  1. Xanax 0.5 mg p.r.n.  2. Synthroid 25 mcg once a day.  3. Aspirin 81 mg p.o. once a day.  4. Celexa 20 mg p.o. daily.   SOCIAL HISTORY:  Patient is currently going through a divorce.  She has  three children.  She smokes about 1-1/2 packs of cigarettes a day.  The  patient has alcohol on the weekends.  She is a caregiver for an elderly  person.   She denies any illicit drug use.   FAMILY HISTORY:  Positive for chronic GI illnesses,  history of  inflammatory bowel disease, and colorectal cancer.   PHYSICAL EXAMINATION:  GENERAL:  Comfortable, not in acute distress.  VITAL SIGNS:  Blood pressure on arrival was 110/66 with pulse 82,  respirations 18, temperature 98.6 degrees Fahrenheit, oxygen saturation  96% on room air.  HEENT:  Normocephalic, atraumatic.  Oral mucosa was dry, no exudates  were noted.  NECK:  Supple, no JVD, lymphadenopathy.  LUNGS:  Clear currently with good air entry bilaterally.  HEART:  S1, S2 regular, no gallops or rubs.  ABDOMEN:  Soft, nondistended.  Mild tenderness around the suprapubic  area.  No rebound, no guarding, no rigidity.  EXTREMITIES:  No pitting pedal edema, no calf induration or fremitus was  noted.   LABORATORY/DIAGNOSTIC DATA:  White blood cell count 12.3, hemoglobin  13.7, hematocrit 39.1, platelet count 183 with 81% neutrophils.  Sodium  139, potassium 3.8, chloride 109, CO2 28, glucose 95, BUN 13, creatinine  0.89.  AST 19, ALT 16.  Albumin 3.8.  Calcium 9.4.   Lipase normal at 18.  Urinalysis was negative.   CT scan of the abdomen and pelvis revealed enhancement involving the  small bowel loops in the pelvis possibly enteritis with no evidence of  obstruction, perforation or abscess formation.   ASSESSMENT:  This is a 40 year old female being admitted to the hospital  with fever, chills, consistent with acute gastroenteritis.  She did have  a CT scan which showed enteritis.  The patient is mildly dehydrated.   PLAN:  1. Will admit to medical floor.  2. Would continue Cipro and Flagyl which was initiated in the      emergency room.  3. IV fluids, normal saline hydration.  4. Will request gastroenterology consult.  5. This is one of a series of admissions tonight with similar symptoms      of nausea, vomiting, abdominal pain, and diarrhea.  Will be on the      lookout for any more additional cases to ensure that there is no      common denominator in the causation; possibly in the water or      something in [place].  6. I have discussed the above plan with her.  She verbalized full      understanding.  I will hold her aspirin for now.      Margaretmary Dys, M.D.  Electronically Signed     AM/MEDQ  D:  12/01/2007  T:  12/01/2007  Job:  [id number]

## 2010-11-04 NOTE — Group Therapy Note (Signed)
[name], [name]              ACCOUNT NO.:  [id number]   MEDICAL RECORD NO.:  [id number]          PATIENT TYPE:  INP   LOCATION:  A307                          FACILITY:  [facility]   PHYSICIAN:  Lucita Ferrara, MD         DATE OF BIRTH:  02-07-1971   DATE OF PROCEDURE:  12/03/2007  DATE OF DISCHARGE:                                 PROGRESS NOTE   Patient examined by bedside today.  She is again stating that she still  has the same symptoms.  She is on a clear liquid diet now.  She denies  any nausea, vomiting, however she is still on Cipro and Flagyl.  GI  recommendations were made to proceed with colonoscopy perhaps today.   VITAL SIGNS:  Temperature 97.2, pulse 68, respirations 18, blood  pressure was 90/61.  HEENT:  Normocephalic, atraumatic.  CARDIOVASCULAR:  S1-S2.  ABDOMEN:  Soft, nontender, nondistended.  LUNGS:  Clear to auscultation.  EXTREMITIES:  No clubbing, cyanosis.   LABORATORY DATA:  Hepatic function is normal with an exception of a mild  moderately low albumin of 3.3.  CMP normal, CBC normal.  Radiological  results:  CT of the abdomen and pelvis from the 10th shows abnormal  enhancement of wall thickening involving the pelvic small bowel loops  which is concerning for enteritis.   ASSESSMENT/PLAN:  Will continue per gastroenterology recommendations.  Continue for possible colonoscopy, continue her ciprofloxacin and  metronidazole, continue pain management.      Lucita Ferrara, MD  Electronically Signed     RR/MEDQ  D:  12/03/2007  T:  12/03/2007  Job:  [id number]

## 2010-11-04 NOTE — Discharge Summary (Signed)
[name], [name]              ACCOUNT NO.:  [id number]   MEDICAL RECORD NO.:  [id number]          PATIENT TYPE:  INP   LOCATION:  A212                          FACILITY:  [facility]   PHYSICIAN:  Skeet Latch, DO    DATE OF BIRTH:  [birth year]   DATE OF ADMISSION:  07/11/2007  DATE OF DISCHARGE:  01/22/2009LH                               DISCHARGE SUMMARY   DISCHARGE DIAGNOSES:  1. Acute left hemiparesis.  2. Vertigo.  3. Single focal hyperintensity in the temporal lobe on MRI.   BRIEF HOSPITAL COURSE:  This is a 40 year old Caucasian female who  presented with left thigh hemiparesis.  The patient had acute onset of  dizziness, lightheadedness while sitting, states that she started to  have progressive left-sided weakness.  The patient experienced some  numbness of her left hand and left leg.  The patient states the strength  has slowly improved since her hospital stay.  The patient denied any a  major headache, did have some slight visual disturbances with flashing  lights.  Denied any nausea or vomiting, had some atypical chest  discomfort.  The patient was seen. Her initial CT scan was negative.  The patient then had a MRI scan that showed  1. No evidence of acute ischemia.  2. Single punctate focal hyperintensity in the middle right      mesiotemporal lobe.  3. Mild to moderate perisinusitis type changes.   PT/OT was also ordered for the patient.  The patient's weakness has  slowly improved.  She still had some weakness on left lower extremity.  The patient was placed on her home medications for her depression,  anxiety and history of panic attacks also.  Neurology was consulted and  stated that it is most likely a single demyelinization plaque, maybe pre-  MS.  Recommended  1. A repeat PA MRI and three 6 months.  2. Physical therapy.  3. Symptomatic treatment of her vertigo.   The patient was placed on Antevert for her vertigo.  It is still present  but it is improved  at this time.  The patient's TSH was elevated.  Thyroid ultrasound was ordered.  Her thyroid ultrasound results are  pending at this time.  The patient is greatly improved.  Neurology feels  the patient could be discharged to home.  The patient will be discharged  at this time.   MEDICATIONS ON DISCHARGE:  1. Celexa 20 mg daily.  2. BuSpar 5 mg daily.  3. Xanax 0.5 mg daily.  4. Meclizine 25 mg q.6h. p.r.n.  5. Synthroid 25 mcg p.o. daily.   RADIOLOGIC STUDIES:  Please see brief hospital course for CT and MR  studies.  The patient also had an echocardiogram performed which was  essentially normal.  It showed a minimal pericardial effusion, but  essentially normal.  She had a carotid Doppler performed that showed  normal bilateral cervical carotid arteries, no vascular stenosis.  Chest  x-ray was negative.   DISCHARGE VITAL SIGNS:  Temperature is 97.5, pulse 65, respirations 24,  blood pressure was 79/53.  O2 saturations 96%.   LABORATORY  DATA:  Sodium 142, potassium 4.3, chloride 110, CO2 26,  glucose 94, BUN 15, creatinine 1.05.  T3 and T4 levels were negative.  Her RPR was negative.  TSH was 15.99.  Lipid panel showed cholesterol  186, triglycerides 270, HDL 25, LDL 107.   CONDITION ON DISCHARGE:  Stable.   DISPOSITION:  The patient to be discharged to home.   DISCHARGE INSTRUCTIONS:  The patient is to return to work in  approximately one or two weeks.  She is to maintain a regular diet.   ACTIVITY:  She is to increase activity slowly.   FOLLOW UP:  The patient is to follow up with Dr. Gerilyn Pilgrim, of neurology  in one month.  She is to follow up with her primary care physician in  one to two weeks.  Patient is to follow up with her primary care  physician in four to six weeks to have her thyroid levels checked and  refill her thyroid medication.  The patient probably also needs her  lipid panel checked for elevated triglycerides.      Skeet Latch, DO   Electronically Signed     SM/MEDQ  D:  07/14/2007  T:  07/14/2007  Job:  [id number]   cc:   Darleen Crocker A. Gerilyn Pilgrim, M.D.  Fax: [phone]

## 2010-11-04 NOTE — Op Note (Signed)
[name], [name]              ACCOUNT NO.:  [id number]   MEDICAL RECORD NO.:  [id number]          PATIENT TYPE:  INP   LOCATION:  A307                          FACILITY:  [facility]   PHYSICIAN:  Kassie Mends, M.D.      DATE OF BIRTH:  Apr 30, 1971   DATE OF PROCEDURE:  12/06/2007  DATE OF DISCHARGE:                               OPERATIVE REPORT   PROCEDURE:  Esophagogastroduodenoscopy with cold forceps biopsy.   REFERRING PHYSICIAN:  Lucita Ferrara, MD.   INDICATIONS FOR EXAMINATION:  [name] is a 40 year old female who  presented to the emergency department with abdominal pain, nausea,  vomiting and diarrhea.  CT scan showed enteritis.  She was discharged to  home and returned because she was having ongoing nausea and diarrhea as  well as abdominal pain.   FINDINGS:  1. Normal esophagus without evidence of Barrett's, mass, erosion,      ulceration or stricture.  2. Patchy erythema in the antrum without erosion or ulceration.      Biopsy was obtained via cold forceps to evaluate for H. pylori      gastritis.  3. Streaky erythema in the duodenal bulb.  Normal second portion of      the duodenum with adequate bile staining.  Biopsies obtained via      cold forceps to evaluate for celiac sprue.   DIAGNOSES:  1. Mild gastritis.  2. Mild duodenitis.  3. No obvious source for [name]'s nausea, vomiting and abdominal      pain identified on endoscopic exam.   RECOMMENDATIONS:  1. Continue PPI.  2. Agree with obtaining a CT scan today to evaluate for progressive      inflammation.  3. After the CT scan, would advance to clear liquid diet.  She should      be on around-the-clock antiemetics.  Have written for Phenergan      12.5 mg IV every 6 hours.  4. Will await biopsies.  5. No aspirin or anti-inflammatory drugs for 30 days.  No      anticoagulation for five days.  6. Would add Ultram every 6 hours as needed for abdominal pain if the      CT scan shows evidence of  inflammation.   MEDICATIONS:  1. Demerol 100 mg IV.  2. Versed 5 mg IV.  3. Phenergan 25 mg IV.   DESCRIPTION OF PROCEDURE:  Physical examination was performed.  Informed  consent was obtained from the patient after explaining the benefits,  risks and alternatives to the procedure.  Patient was connected to the  monitor and placed in the left lateral position.  Continuous oxygen was  provided by nasal cannula and IV medicine administered through an  indwelling cannula.  After administration of sedation, patient's  esophagus was intubated and the scope was advanced under direct  visualization to the second portion of the duodenum.  The scope was  removed slowly by carefully removing the color, texture and integrity of  the anatomy of the mucosa on the way out.  The patient was recovered in  endoscopy and discharged to the  floor in satisfactory condition.      Kassie Mends, M.D.  Electronically Signed     SM/MEDQ  D:  12/06/2007  T:  12/06/2007  Job:  [id number]

## 2010-11-04 NOTE — Group Therapy Note (Signed)
[name], [name]              ACCOUNT NO.:  [id number]   MEDICAL RECORD NO.:  [id number]          PATIENT TYPE:  INP   LOCATION:  A307                          FACILITY:  [facility]   PHYSICIAN:  Lucita Ferrara, MD         DATE OF BIRTH:  Sep 13, 1970   DATE OF PROCEDURE:  12/01/2007  DATE OF DISCHARGE:                                 PROGRESS NOTE   Full history and physical examination reviewed.  Patient is a 40-year-  old, presented to emergency room yesterday with acute, constant  abdominal pain, diarrhea.  She had some bright, red blood per rectum.  Her last antibiotic use was last for colonoscopy in April of 2009.  She  was already evaluated by Gastroenterology.  CT scan suggested  abnormality in pelvic bowel loops, gastroenteritis.  Per  recommendations, she is currently on supportive measures, Cipro and  Flagyl.  We are awaiting stool cultures.  She feels much better now.   Temperature 100.3, respirations 18, pulse 90, blood pressure 101/63.  O2  saturation 95% on room air.  HEENT:  Mucous membranes dry.  CARDIOVASCULAR:  S1, S2.  ABDOMEN:  Diffusely tender all quadrants.  EXTREMITIES:  No clubbing, cyanosis or edema.  NEURO:  Patient is anxious, yet alert and oriented x3.   LABORATORY DATA:  Basic metabolic panel normal.  CBC normal.   ASSESSMENT/PLAN:  Acute gastroenteritis.  Continue supportive measures.  Continue Cipro and Flagyl.  Await Clostridium difficile and stool  cultures. The rest of plans are depending on progress and GI  reccomendations.      Lucita Ferrara, MD  Electronically Signed     RR/MEDQ  D:  12/01/2007  T:  12/01/2007  Job:  [id number] [id number]

## 2010-11-04 NOTE — H&P (Signed)
[name], [name]              ACCOUNT NO.:  [id number]   MEDICAL RECORD NO.:  [id number]          PATIENT TYPE:  INP   LOCATION:  A212                          FACILITY:  [facility]   PHYSICIAN:  Osvaldo Shipper, MD     DATE OF BIRTH:  1971-05-04   DATE OF ADMISSION:  07/11/2007  DATE OF DISCHARGE:  LH                              HISTORY & PHYSICAL   PRIMARY CARE PHYSICIAN:  Unknown.   ADMISSION DIAGNOSES:  1. Left-sided hemiparesis, unclear etiology.  2. History of panic attacks.  3. History of depression and anxiety.   CHIEF COMPLAINT:  Left-sided weakness and numbness since 3 p.m.   HISTORY OF PRESENT ILLNESS:  The patient is a 40 year old Caucasian  female who has a past medical history of panic disorder and depression  who was in her usual state of health until about 3 o'clock this  afternoon when she started having dizziness and lightheadedness while  sitting.  The patient denied any fever or chills.  These symptoms  progressed to involve her left side.  She experienced numbness in the  left hand.  She also experienced numbness in the left leg. She was  unable to move both of these extremities eventually.  She feels that  some of her strength has come back since she has been here.  Denies any  headache.  She did have some visual disturbance in the form of  photopsias, flashing lights.  She denies similar symptoms in the past.  She denies any seizure-type disorder.  No nausea or vomiting.  She had  some chest pain on the left side earlier today, but that has resolved  since.  She gets chest pain whenever she has panic attacks.   MEDICATIONS AT HOME:  1. Citalopram 20 mg once a day.  2. BuSpar 5 mg once a day.  3. Xanax 0.5 mg nightly.   ALLERGIES:  No known drug allergies.   PAST MEDICAL HISTORY:  1. Anxiety disorder.  2. Depression.  3. Panic attacks.   No history of migraines.   PAST SURGICAL HISTORY:  Positive for hysterectomy and oophorectomy.  She  has had an  appendectomy and knee surgery in the past.   SOCIAL HISTORY:  Lives in [place] with her boyfriend and son.  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  She  smokes 2 packs of cigarettes on a daily basis.  Occasional alcohol use,  not on a daily basis.  She denies any illicit drug use.   FAMILY HISTORY:  Unremarkable as per the patient.   REVIEW OF SYSTEMS:  A 10-point Review of Systems was negative for any  acute abnormalities.   PHYSICAL EXAMINATION:  VITAL SIGNS:  Temperature 97.9, blood pressure  136/93, heart rate 87, respiratory rate 18, saturation 97% on room air.  GENERAL:  Thin white female in no distress.  HEENT:  There is no pallor, no icterus.  Oral mucous membranes moist.  No oral lesions are noted.  NECK:  Soft and supple.  No thyromegaly is appreciated.  LUNGS:  Clear to auscultation  bilaterally.  CARDIOVASCULAR:  S1 and S2 normal, regular.  No murmurs appreciated.  No  S3, S4.  No rubs, no bruits are heard.  ABDOMEN:  Soft, nontender, nondistended.  Bowel sounds are present.  No  mass or organomegaly is appreciated.  EXTREMITIES:  Show no edema.  Peripheral pulses are palpable.  NEUROLOGIC:  The patient is alert and oriented x3.  Cranial nerve exam  is unremarkable.  Motor strength in the lower extremities is 3-4/5; left  upper extremity is about 4/5. She has some sensory deficit as well.  Tone is normal.  Pronator drift not noted.  Cerebellar:  Unable to go on  the left lower side.  Left upper is sluggish.   LABORATORY DATA:  CBC unremarkable.  CMET unremarkable.   She had a CT of her head done which was negative for any acute process.   No EKG has been done.   No chest x-ray has been done.   ASSESSMENT:  This is a 40 year old white female with medical problems as  stated earlier who presents with left-sided hemiparesis since 3 p.m.  Possible differentials include cerebrovascular accident, migraine  disorder with neurological  manifestation.  A seizure disorder is also a  possibility.   PLAN:  1. Left-sided hemiparesis:  We will admit her to the hospital, get an      MRI done tomorrow morning.  Check homocysteine, RPR, Dopplers,      echocardiogram.  Neurological consultation will be obtained.  Urine      drug screen will be checked.  EEG will be ordered.  EKG will be      checked.  We will put her on aspirin for now.      Lipid profile will also be checked.  Her blood pressure is      reasonable at this time.  2. History of panic attacks:  Continue her psychotropic medications.  3. Smoking cessation emphasized to the patient.  Nicotine patch will      be provided.      Osvaldo Shipper, MD  Electronically Signed     GK/MEDQ  D:  07/11/2007  T:  07/11/2007  Job:  [id number]   cc:   Darleen Crocker A. Gerilyn Pilgrim, M.D.  Fax: [phone]

## 2010-11-04 NOTE — Op Note (Signed)
[name], [name]              ACCOUNT NO.:  [id number]   MEDICAL RECORD NO.:  [id number]          PATIENT TYPE:  AMB   LOCATION:  DAY                           FACILITY:  [facility]   PHYSICIAN:  Kassie Mends, M.D.      DATE OF BIRTH:  [birth year]   DATE OF PROCEDURE:  12/28/2007  DATE OF DISCHARGE:                               OPERATIVE REPORT   REFERRING PHYSICIAN:  Lucita Ferrara, MD   PROCEDURE:  Ileocolonoscopy with cold forceps biopsy.   INDICATION FOR EXAMINATION:  [name] is a 40 year old female who  presents with rectal bleeding.  She is having one loose bowel movement a  day.  She complains of pain after eating.  She has been maintained on  Bentyl, Prilosec, and amitriptyline.  She has significant psychosocial  stressors in her life.  One child was diagnosed with bipolar disorder,  38 year old, left home secondary to drug abuse, and her father has brain  tumor.   FINDINGS:  1. Normal terminal ileum approximately 20 cm visualized.  2. Normal cecum, ascending colon, and transverse colon.  The left      colon was obscured by retained particulate and liquid stool.  She      had formed stool in the vault.  The liquid could not be aspirated,      and the liquid could not be moved to a position, in which the      mucosa could be adequately visualized.  3. Small internal hemorrhoids.  Otherwise, normal retroflexed view of      the rectum.   DIAGNOSES:  1. No source for [name]'s loose stool is identified.  Biopsies were      obtained via cold forceps to evaluate for microscopic colitis.  2. Small internal hemorrhoids, which was likely the etiology for her      rectal bleeding, but polyps less than 1 cm would have been easily      missed in the left colon.   RECOMMENDATIONS:  1. [name] should continue on a clear liquid diet.  She should      return tomorrow for a repeat colonoscopy due to poor prep.  2. She should continue the Bentyl, the Elavil, and the Prilosec at her     current dose.  If no polyps have been identified to evaluate for      her hematochezia, then she will be placed on rectal suppositories,      and both her Bentyl and her Elavil will be increased.   MEDICATIONS:  1. Demerol 125 mg IV.  2. Versed 8 mg IV.  3. Phenergan 25 mg IV.   PROCEDURE TECHNIQUE:  Physical exam was performed.  Informed consent was  obtained.  The patient was explained of the benefits, risks, and  alternatives to the procedure.  The patient was connected to monitor and  placed in the left lateral position.  Continuous oxygen was provided via  nasal cannula.  IV medicine administered through an indwelling cannula.  After administration of sedation and the rectal exam, the patient's  rectum was intubated.  The scope was  advanced under direct visualization  to the distal terminal ileum.  The scope was removed slowly by carefully  examining the color, texture, anatomy, and integrity of the mucosa on  the way out.  The patient was recovered in endoscopy and discharged home  in satisfactory condition.      Kassie Mends, M.D.  Electronically Signed     SM/MEDQ  D:  12/28/2007  T:  12/29/2007  Job:  [id number]

## 2010-11-04 NOTE — Consult Note (Signed)
[name], [name]              ACCOUNT NO.:  [id number]   MEDICAL RECORD NO.:  [id number]          PATIENT TYPE:  INP   LOCATION:  A212                          FACILITY:  [facility]   PHYSICIAN:  Kofi A. Gerilyn Pilgrim, M.D. DATE OF BIRTH:  1971-06-06   DATE OF CONSULTATION:  07/13/2007  DATE OF DISCHARGE:  07/14/2007                                 CONSULTATION   REASON FOR CONSULTATION:  Left-sided weakness and numbness.   Patient is a 40 year old white female who presents with the relatively  acute onset of dizziness described as a spinning sensation associated  with left upper and lower extremity heaviness and numbness.  The patient  apparently also had some flashing visual disturbance.  The patient has  been hospitalized for a day or so and has reported some improvement in  her symptoms.  No seizure activity is reported.  No headaches are  reported.   The past medical history is significant for anxiety disorder and panic  attacks, depression.   ADMISSION MEDICATIONS:  1. Celexa 20 mg daily.  2. BuSpar 5 mg.  3. Xanax.   PAST SURGICAL HISTORY:  Hysterectomy and oophorectomy.  Also,  appendectomy and knee surgery.   SOCIAL HISTORY:  Lives with her boyfriend and son.  She does smoke two  packs of cigarettes daily.  Occasional alcohol use.  No illicit drug  use.   FAMILY HISTORY:  Unremarkable.   REVIEW OF SYSTEMS:  As stated in the history of present illness,  otherwise unrevealing.   PHYSICAL EXAMINATION:  She is a pleasant, thin lady in no acute  distress.  Temperature 98, pulse 69, respirations 20, blood pressure 116/76.  HEENT:  Neck is supple.  Head is normocephalic and atraumatic.  ABDOMEN:  Soft.  EXTREMITIES:  No edema.  NEUROLOGIC:  Mentation:  The patient is awake and alert.  Speech,  language, and cognition are intact.  Cranial nerves evaluation:  Pupils  are equal, round and reactive to light and accommodation.  Visual fields  are intact.  Facial muscles are  symmetric.  Trachea is midline.  Uvula  is midline.  Shoulder shrugs normal.  Motor examination shows 3/5  strength involving the left upper extremity and left leg.  I really do  not see any pronator drift.  Coordination shows no dysmetrias or  tremors.  There is no Parkinsonism.  Reflexes are normal with downgoing  plantar reflexes.   MRI is reviewed, and it shows tiny white matter lesions involving the  medial temporal lobe on the right side.   Patient did have echo and carotid, which were both unrevealing.   EEG was also done, which is normal.   IMPRESSION:  Acute onset of left hemiparesis with visual disturbance and  dizziness.  Differential diagnosis includes seizures, although EEG is  normal.  She does have a single lesion in the right temporal area, which  may correspond to her symptoms of the single lesions in the white matter  and raises the suspicion of demyelinating plaque/pre-multiple sclerosis  state.  The diffusion imaging on MRI does not show acute infarct;  therefore, this likely  does not represent stroke.  Migraines are also a  possibility to consider, especially given the visual symptoms.   RECOMMENDATIONS:  I think we should repeat her imaging in about 3-6  months and follow her closely.  Additional labs have been ordered,  including B12, thyroid function tests, and RPR.  These will be followed.  Thanks for this consultation.      Kofi A. Gerilyn Pilgrim, M.D.  Electronically Signed     KAD/MEDQ  D:  07/15/2007  T:  07/15/2007  Job:  [id number]

## 2010-11-04 NOTE — Procedures (Signed)
Alyssa, [name]              ACCOUNT NO.:  [id number]   MEDICAL RECORD NO.:  [id number]          PATIENT TYPE:  INP   LOCATION:  A212                          FACILITY:  [facility]   PHYSICIAN:  Gerrit Friends. Dietrich Pates, MD, FACCDATE OF BIRTH:  1970/10/28   DATE OF PROCEDURE:  07/12/2007  DATE OF DISCHARGE:                                ECHOCARDIOGRAM   REFERRING:  [name], MD.   CLINICAL DATA:  A 40 year old woman with CVA.   M-mode:  Aorta 2.9, left atrium 3.7, septum 1.4, posterior wall 1.1, LV  diastole 3.6, LV systole 3.0.   1. Technically adequate echocardiographic study.  2. Normal left atrium, right atrium and right ventricle.  3. Normal proximal ascending aorta; normal proximal pulmonary artery.  4. Normal aortic, mitral, tricuspid and pulmonic valves; trivial      mitral regurgitation.  5. Normal internal dimension, wall thickness, regional and global      function of the left ventricle.  6  Normal IVC.  7  Minimal pericardial effusion.      Gerrit Friends. Dietrich Pates, MD, [facility]  Electronically Signed     RMR/MEDQ  D:  07/12/2007  T:  07/13/2007  Job:  [id number]

## 2010-11-04 NOTE — Group Therapy Note (Signed)
NAME:  [name], [name]              ACCOUNT NO.:  [id number]   MEDICAL RECORD NO.:  [id number]          PATIENT TYPE:  INP   LOCATION:  A307                          FACILITY:  [facility]   PHYSICIAN:  Lucita Ferrara, MD         DATE OF BIRTH:  May 17, 1971   DATE OF PROCEDURE:  DATE OF DISCHARGE:                                 PROGRESS NOTE   This patient returned again with pretty much the same symptoms, acute  bouts of abdominal pain, diarrhea and poor p.o. tolerance.  She again  was diagnosed with enteritis and started on Cipro IV.  IV fluids.  Gastroenterology was consulted.  C diff was again sent off.  Her  laboratory workup is so far unremarkable.  Gastroenterology did consult,  yet pending.   OBJECTIVE:  VITAL SIGNS:  Temperature 97.7, pulse 55, respirations 20,  blood pressure 188/44.  HEENT:  Normocephalic, atraumatic.  Mucous membranes dry.  CARDIOVASCULAR:  S1-S2.  Regular rate and rhythm.  ABDOMEN:  Diffusely tender, not distended, however, positive bowel  sounds.  LUNGS:  Clear to auscultation bilaterally.   LABORATORY DATA:  Basic metabolic panel normal.  CBC normal.  Lipase and  amylase pending.  Radiological results:  The patient has no radiological  data as of yet.   ASSESSMENT/PLAN:  Patient with recurrent abdominal pain and  gastroenteritis.  Now kept n.p.o., currently on Cipro.  Stool sent off  for C diff.  Will continue to monitor.  Continue to keep her n.p.o. per  GI recommendations for further recommendations.  The rest of the plans  will depend on her progress.      Lucita Ferrara, MD  Electronically Signed     RR/MEDQ  D:  12/05/2007  T:  12/05/2007  Job:  [id number]

## 2010-11-04 NOTE — Group Therapy Note (Signed)
NAMESHARVI, MOONEYHAN              ACCOUNT NO.:  [id number]   MEDICAL RECORD NO.:  [id number]          PATIENT TYPE:  INP   LOCATION:  A307                          FACILITY:  [facility]   PHYSICIAN:  Lucita Ferrara, MD         DATE OF BIRTH:  May 11, 1971   DATE OF PROCEDURE:  12/02/2007  DATE OF DISCHARGE:                                 PROGRESS NOTE   The patient examined at bedside today.  She told me that she is not  tolerating her diet well, I think that she is on clear liquid diet now.  She denies any nausea, vomiting, however.  Note that she has a history  of chronic bright red blood per rectum secondary to hemorrhoids.  She  was admitted back on June 10 with acute abdominal pain, nausea,  vomiting, dehydration, and likely gastroenteritis.  She was started on  Cipro and Flagyl.  GI was consulted and recommendations were made.   OBJECTIVE:  VITAL SIGNS:  Temperature 98.6, pulse 74, respirations 18,  blood pressure 101/65, pulse oximetry 96% on room air.  HEENT:  Normocephalic, atraumatic.  Sclerae anicteric.  NECK:  Supple.  No JVD or carotid bruits.  CARDIOVASCULAR:  S1-S2.  ABDOMEN:  Soft, nontender, nondistended, positive bowel sounds.  EXTREMITIES:  No clubbing, cyanosis, or edema.   LABORATORY DATA:  AST and ALT are high at 40 and 41 minimally mildly  high.  CBC mild anemia that is normocytic at 11.9 and 34.4, but her  complete metabolic panel was otherwise negative.  Her stool studies are  yet to be resulted.  Her UA is negative for leukocytes.  Again, her  stool results are pending.   ASSESSMENT/PLAN:  1. Nausea, vomiting, diarrhea, acute gastroenteritis.  Rule out      infectious etiology.  2. Chronic hematochezia from rectal hemorrhoids.   PLAN:  Will continue Cipro, Flagyl, and advance diet as tolerated.  Await stool studies, await further recommendations per gastroenterology.  She may need a colonoscopy at some point, I suppose.      Lucita Ferrara, MD  Electronically  Signed     RR/MEDQ  D:  12/02/2007  T:  12/02/2007  Job:  [id number]

## 2010-11-07 NOTE — Op Note (Signed)
NAME:  [name], [name]                        ACCOUNT NO.:  [id number]   MEDICAL RECORD NO.:  [id number]                   PATIENT TYPE:  AMB   LOCATION:  DAY                                  FACILITY:  [facility]   PHYSICIAN:  Bernerd Limbo. Leona Carry, M.D.             DATE OF BIRTH:  [birth year]   DATE OF PROCEDURE:  08/09/2002  DATE OF DISCHARGE:                                 OPERATIVE REPORT   PREOPERATIVE DIAGNOSES:  Bilateral cystic ovaries, possible endometrioma.   POSTOPERATIVE DIAGNOSES:  Bilateral cystic ovaries, possible endometrioma.   OPERATIVE PROCEDURE:  Pelvic laparotomy, bilateral salpingo-oophorectomy,  and incidental appendectomy.   COMPLICATIONS:  None.   SURGEON:  Bernerd Limbo. Destefano, M.D.   DESCRIPTION OF PROCEDURE:  Under adequate general anesthesia, the patient  was prepped and draped in the usual manner, and a transverse Pfannenstiel  incision was made.  This was carried through the subcutaneous tissue to the  anterior rectus sheath which was incised in the midline.  The muscle was  retracted.  The posterior rectus sheath and peritoneum were incised, the  abdomen opened, and the intestinal contents examined.  There were diffuse  adhesions in the pelvis secondary to previous hysterectomy.  She did have an  enlarged cystic right ovary that appeared to be buried in the  retroperitoneum.  By means of sharp and blunt dissection, this was  mobilized, and the right infundibulopelvic vessels were clamped, divided,  and suture ligated with #1 chromic.  By means of sharp dissection, the ovary  and tube were removed in toto.  Attention was then turned to the left side.  The left ovary was smaller but buried also into the retroperitoneum.  Bowel  had to be peeled off it.  The infundibulopelvic ligament was clamped,  divided and ligated with 0 chromic catgut.  The ovary was mobilized by means  of sharp and blunt dissection and removed.  At the completion of this  portion of the  procedure, all bleeding was under control.  The area was then  packed with a large lap and attention turned to the cecum.  The cecum was  entered through the same wound, and the appendix identified.  The appendix  was ligated doubly at its base, mesoappendix doubly clamped, divided, and  ligated 0 chromic.  At the completion of this portion of the procedure, all  bleeding was under control.  Original operative site of the ovaries was then  re-examined.  All bleeding appeared to be under control.  Gelfoam pack with  Surgicel was placed in the pelvis.  After determining that all bleeding was  under control, the peritoneum and posterior rectus sheath were closed with  continuous double 0 Novofil, the rectus muscles and the anterior rectus  sheath with continuous double 0 Novofil, the subcutaneous tissues closed  with continuous 3-0 Vicryl, and the skin was closed with subcuticular suture  of 3-0 Vicryl.  Steri-Strips, Telfa,  and OpSite dressing was applied.  Foley  catheter was left in place.  Blood loss was measured at about 100 cc.  The  patient tolerated the procedure nicely and left in good condition.                                               Bernerd Limbo. Leona Carry, M.D.    NMD/MEDQ  D:  08/09/2002  T:  08/09/2002  Job:  [id number]

## 2010-11-07 NOTE — Op Note (Signed)
[name], [name]              ACCOUNT NO.:  [id number]   MEDICAL RECORD NO.:  [id number]          PATIENT TYPE:  AMB   LOCATION:  DSC                          FACILITY:  [facility]   PHYSICIAN:  Mila Homer. Sherlean Foot, M.D. 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   POSTOPERATIVE DIAGNOSIS:  I believe 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   OPERATION PERFORMED:  Right knee arthroscopy with plicectomy and diagnostic  arthroscopy.   SURGEON:  Mila Homer. Sherlean Foot, M.D.   ASSISTANT:  None.   ANESTHESIA:  General.   INDICATIONS FOR PROCEDURE:  The patient is a 40 year old who had a normal  MRI scan but continued mechanical symptoms and despite therapy and time, did  not improve and continued to have effusions.  Informed consent was obtained.   DESCRIPTION OF PROCEDURE:  The patient was laid supine and administered  general anesthesia.  The right lower extremity was prepped and draped in the  usual sterile fashion.  Inferolateral and inferomedial portals were created  with a #11 blade, blunt trocar and cannula.  Diagnostic arthroscopy revealed  no chondromalacia in the patellofemoral joint, no lateral tracking of the  patella.  There was very large plica that was impinging into the  patellofemoral joint.  This was a true plica because she had not been given  a knee block which often times can cause the fat pad to creep into the  patellofemoral space.  This plica went around to the medial side and was  definitely causing wear on that medial femoral condyle.  This was documented  via intraoperative photography.  I debrided the plica as well as the  abrasion on the medial femoral condyle with the  small [employer] shaver and  the ArthroCare debridement wand.  I then went down into the notch.  The ACL  and PCL were normal.  The medial and lateral compartments were also normal.  I probed both menisci.  I went to the back of the knee and found absolutely  no pathology. I therefore feel that her main problem was this plica as well  as some potential patellofemoral pain syndrome.  I then lavaged, closed with  interrupted 4-0 nylon sutures, dressed with Adaptic, 4 x 4s, sterile Webril  and an Ace wrap.   COMPLICATIONS:  None.   DRAINS:  None.   I did infiltrate 10 cc of a Marcaine morphine mixture into each portal prior  to dressings.      SDL/MEDQ  D:  08/27/2004  T:  08/27/2004  Job:  [id number]

## 2010-11-07 NOTE — Discharge Summary (Signed)
   NAME:  [name], [name]                        ACCOUNT NO.:  [id number]   MEDICAL RECORD NO.:  [id number]                   PATIENT TYPE:  INP   LOCATION:  A336                                 FACILITY:  [facility]   PHYSICIAN:  Bernerd Limbo. Leona Carry, M.D.             DATE OF BIRTH:  1971-05-07   DATE OF ADMISSION:  08/09/2002  DATE OF DISCHARGE:  08/11/2002                                 DISCHARGE SUMMARY   ADMITTING DIAGNOSIS:  Right ovarian mass, ovarian cystic disease.   FINAL DIAGNOSIS:  Bilateral cystic ovaries.   OPERATION PERFORMED:  On August 09, 2002 pelvic laparotomy, bilateral  salpingo-oophorectomy, incidental appendectomy.   COMPLICATIONS:  None.   PROGNOSIS:  Good.  The patient recovered.   HISTORY OF PRESENT ILLNESS:  This 40 year old white female was admitted to  this hospital on February 18 complaining of severe pain in the region of the  right ovary.  She had previously had a total abdominal hysterectomy for  fibroid uterus about two years prior to this admission.  At that time she  was told she had cysts on both ovaries, most marked on the right.  Her  symptoms of pain and discomfort apparently increased and she was admitted  for removal of both ovaries.   PHYSICAL EXAMINATION:  GENERAL:  Revealed a healthy 40 year old white female  in no acute distress with some moderate pelvic discomfort.  PELVIC:  Revealed marital introitus.  There was no pathology of Bartholin's  or Skene's glands.  The vaginal cuff is well healed.  Examination of the  right adnexa did reveal a large cystic mass which probably measured about 4-  5 cm in diameter.  The left side appeared to be essentially normal.   HOSPITAL COURSE AND TREATMENT:  On February 18 she was taken to the  operating room where pelvic laparotomy, bilateral salpingo-oophorectomy for  cystic ovaries, and incidental appendectomy were carried out without  difficulty.  Postoperatively she did nicely.  The Foley catheter  was removed  on February 18.  She was up and moving by February 19, still complaining of  a moderate amount of discomfort.  She was much improved by February 20, up  in the hall, eating, normal bowel movements, and after-care outlined, and  she was discharged home on February 20 to be seen in my office in one week.                                               Bernerd Limbo. Leona Carry, M.D.    NMD/MEDQ  D:  09/05/2002  T:  09/05/2002  Job:  [id number]

## 2010-11-07 NOTE — H&P (Signed)
[name], [name]                          ACCOUNT NO.:  [id number]   MEDICAL RECORD NO.:  [id number]                  PATIENT TYPE:   LOCATION:                                       FACILITY:   PHYSICIAN:  Bernerd Limbo. Leona Carry, M.D.             DATE OF BIRTH:   DATE OF ADMISSION:  08/09/2002  DATE OF DISCHARGE:                                HISTORY & PHYSICAL   HISTORY OF PRESENT ILLNESS:  This 40 year old white female is admitted to  this hospital with pelvic pain.  The patient apparently had a total  abdominal hysterectomy for a fibroid uterus about two years ago.  At that  time he did tell her that she had cysts on both ovaries, most marked on the  right.  In the last year or so the patient has been complaining of severe  pain in the region of the right ovary.   I first saw this patient on the 10th of February and examination at that  time revealed a large cystic mass on the right.  Ultrasound of the pelvis  and transvaginal ultrasound was ordered.  This revealed a large ovarian  mass.  The right ovary measured 4.7 x 4.7 x 4.9 cm.  The radiologist felt  that this probably would be an endometrioma, dermatoid, or teratoma to be  differentiated.  The symptoms have increased in severity and she is admitted  now for a bilateral salpingo-oophorectomy.  We tried to get the previous  records on the chart and they were not available.  I am concerned that this  patient probably had previous surgery for endometriosis.   PAST MEDICAL HISTORY:  She is a gravida 3, para 3, AB0.  She had TAH two  years ago.  Prior to that she had a bilateral tubal ligation.   MEDICATIONS:  She is not on any medications.   ALLERGIES:  There is no history of any food or drug allergy.   PHYSICAL EXAMINATION:  GENERAL:  Healthy 40 year old white female in no  acute distress with some moderate pelvic discomfort.  VITAL SIGNS:  Blood pressure 134/76, pulse 80, respirations 20.  HEENT:  Normal.  No  jaundice.  NECK:  Supple.  Thyroid not enlarged.  No palpable cervical adenopathy.  CARDIOVASCULAR:  Regular sinus rhythm.  No thrills or murmurs.  RESPIRATORY:  Chest clear to percussion/auscultation.  BREASTS:  Moderate sized and symmetrical.  No abnormal masses.  Examination  of both axillae is normal.  ABDOMEN:  Soft.  There is a healed Pfannenstiel operative scar.  There is  some tenderness suprapubically.  No masses are really noted  transabdominally.  LIMBS:  Negative.  BACK:  Negative.  PELVIC:  There is a marital introitus.  No pathology of Bartholin's or  Skene's glands.  The vaginal cuff is well healed.  Examination of the right  adnexa did reveal a large cystic mass that probably measured about 4-5 cm  in  diameter.  The left side appeared to be essentially normal.   ADMITTING DIAGNOSES:  1. Right ovarian mass.  2. Ovarian cystic disease.  3. Rule out endometrioma.   DISPOSITION:  The patient is admitted for pelvic laparotomy, bilateral  salpingo-oophorectomy, and probably an incidental appendectomy.   The surgery, risks, and complications and possible consequences have been  discussed with the patient.  She agrees for the surgery.  She has been  scheduled for the 18th of February.                                               Bernerd Limbo. Leona Carry, M.D.    NMD/MEDQ  D:  08/08/2002  T:  08/08/2002  Job:  [id number]

## 2010-11-10 ENCOUNTER — Ambulatory Visit (HOSPITAL_COMMUNITY)
Admission: RE | Admit: 2010-11-10 | Discharge: 2010-11-10 | Disposition: A | Discharge status: Home / Self Care | Payer: Medicaid Other | Source: Ambulatory Visit | Attending: Orthopedic Surgery | Admitting: Orthopedic Surgery

## 2010-11-10 DIAGNOSIS — IMO0001 Reserved for inherently not codable concepts without codable children: Secondary | ICD-10-CM | POA: Insufficient documentation

## 2010-11-10 DIAGNOSIS — M25539 Pain in unspecified wrist: Secondary | ICD-10-CM | POA: Insufficient documentation

## 2010-11-10 DIAGNOSIS — M6281 Muscle weakness (generalized): Secondary | ICD-10-CM | POA: Insufficient documentation

## 2010-11-10 DIAGNOSIS — M25639 Stiffness of unspecified wrist, not elsewhere classified: Secondary | ICD-10-CM | POA: Insufficient documentation

## 2010-11-14 ENCOUNTER — Ambulatory Visit (HOSPITAL_COMMUNITY): Payer: Medicaid Other | Admitting: Specialist

## 2010-11-18 ENCOUNTER — Ambulatory Visit (HOSPITAL_COMMUNITY): Payer: Medicaid Other | Admitting: Specialist

## 2010-11-19 ENCOUNTER — Ambulatory Visit (HOSPITAL_COMMUNITY)
Admission: RE | Admit: 2010-11-19 | Discharge: 2010-11-19 | Disposition: A | Discharge status: Home / Self Care | Payer: Medicaid Other | Source: Ambulatory Visit | Attending: Family Medicine | Admitting: Family Medicine

## 2010-11-21 ENCOUNTER — Ambulatory Visit (HOSPITAL_COMMUNITY)
Admission: RE | Admit: 2010-11-21 | Discharge: 2010-11-21 | Disposition: A | Discharge status: Home / Self Care | Payer: Medicaid Other | Source: Ambulatory Visit | Attending: Family Medicine | Admitting: Family Medicine

## 2010-11-21 DIAGNOSIS — M25539 Pain in unspecified wrist: Secondary | ICD-10-CM | POA: Insufficient documentation

## 2010-11-21 DIAGNOSIS — M6281 Muscle weakness (generalized): Secondary | ICD-10-CM | POA: Insufficient documentation

## 2010-11-21 DIAGNOSIS — IMO0001 Reserved for inherently not codable concepts without codable children: Secondary | ICD-10-CM | POA: Insufficient documentation

## 2010-11-21 DIAGNOSIS — M25639 Stiffness of unspecified wrist, not elsewhere classified: Secondary | ICD-10-CM | POA: Insufficient documentation

## 2010-12-01 ENCOUNTER — Ambulatory Visit (HOSPITAL_COMMUNITY): Payer: Medicaid Other | Admitting: Specialist

## 2011-01-19 ENCOUNTER — Other Ambulatory Visit: Payer: Self-pay

## 2011-01-19 ENCOUNTER — Encounter: Payer: Self-pay | Admitting: *Deleted

## 2011-01-19 DIAGNOSIS — F411 Generalized anxiety disorder: Secondary | ICD-10-CM | POA: Insufficient documentation

## 2011-01-19 DIAGNOSIS — R079 Chest pain, unspecified: Secondary | ICD-10-CM | POA: Insufficient documentation

## 2011-01-19 DIAGNOSIS — R51 Headache: Secondary | ICD-10-CM | POA: Insufficient documentation

## 2011-01-19 NOTE — ED Notes (Signed)
Pt reports she was in the bath tonight and began to have increased anxiety and chest pain, pt also reports headache, pt reports she took a xanax without relief

## 2011-01-20 ENCOUNTER — Emergency Department (HOSPITAL_COMMUNITY)
Admission: EM | Admit: 2011-01-20 | Discharge: 2011-01-20 | Discharge status: Against Medical Advice | Payer: Medicaid Other | Attending: Emergency Medicine | Admitting: Emergency Medicine

## 2011-01-20 HISTORY — DX: Anxiety disorder, unspecified: F41.9

## 2011-01-20 NOTE — ED Notes (Signed)
Attempted to locate pt to bring to room 6.  No response.  Pt not in waiting area.

## 2011-01-20 NOTE — ED Notes (Signed)
Pt called, twice and attempted to bring to room 6.  No response. Will attempt again

## 2011-03-12 LAB — COMPREHENSIVE METABOLIC PANEL
ALT: 26
AST: 29
CO2: 23
Calcium: 10.1
Creatinine, Ser: 0.94
GFR calc Af Amer: >60
GFR calc non Af Amer: >60
Sodium: 140
Total Protein: 7.3

## 2011-03-12 LAB — BASIC METABOLIC PANEL
BUN: 13
CO2: 24
Calcium: 8.7
Chloride: 109
Chloride: 110
Creatinine, Ser: 0.94
Creatinine, Ser: 1.05
GFR calc Af Amer: >60
Glucose, Bld: 93
Potassium: 4.3
Sodium: 142

## 2011-03-12 LAB — CBC
MCHC: 34.2
MCHC: 34.4
MCV: 92.6
MCV: 93
Platelets: 213
RBC: 4.64
RDW: 13.2
RDW: 13.4

## 2011-03-12 LAB — T4: T4, Total: 5.8

## 2011-03-12 LAB — HOMOCYSTEINE: Homocysteine: 10.6

## 2011-03-12 LAB — LIPID PANEL
HDL: 25 — ABNORMAL LOW
Total CHOL/HDL Ratio: 7.4

## 2011-03-12 LAB — DIFFERENTIAL
Basophils Absolute: 0
Basophils Relative: 1
Eosinophils Absolute: 0.3
Monocytes Relative: 6
Neutrophils Relative %: 37 — ABNORMAL LOW

## 2011-03-12 LAB — PROTIME-INR
INR: 1
Prothrombin Time: 13.8

## 2011-03-12 LAB — CARDIAC PANEL(CRET KIN+CKTOT+MB+TROPI)
Total CK: 110
Troponin I: 0.01

## 2011-03-12 LAB — RPR: RPR Ser Ql: NONREACTIVE

## 2011-03-12 LAB — HEMOGLOBIN A1C: Hgb A1c MFr Bld: 5.5

## 2011-03-12 LAB — T3, FREE: T3, Free: 2.5 (ref 2.3–4.2)

## 2011-03-13 LAB — CBC
Hemoglobin: 13.5
MCHC: 34.5
RBC: 4.19

## 2011-03-13 LAB — BASIC METABOLIC PANEL
CO2: 24
Calcium: 9.4
Chloride: 107
GFR calc Af Amer: >60
Sodium: 140

## 2011-03-13 LAB — DIFFERENTIAL
Basophils Relative: 1
Lymphocytes Relative: 37
Monocytes Absolute: 0.4
Monocytes Relative: 4
Neutro Abs: 4.8

## 2011-03-19 LAB — CBC
HCT: 32.3 — ABNORMAL LOW
HCT: 34.7 — ABNORMAL LOW
HCT: 36
HCT: 36
Hemoglobin: 11.9 — ABNORMAL LOW
Hemoglobin: 13.7
Hemoglobin: 12.7
Hemoglobin: 13.1
MCHC: 35.3
MCHC: 34.7
MCHC: 34.9
MCHC: 35
MCHC: 35.2
MCHC: 34.3
MCV: 92.5
MCV: 93.2
MCV: 93.2
MCV: 93.8
MCV: 93.8
Platelets: 161
Platelets: 170
Platelets: 180
Platelets: 198
Platelets: 205
Platelets: 216
RBC: 3.67 — ABNORMAL LOW
RBC: 3.82 — ABNORMAL LOW
RBC: 4.19
RBC: 3.86 — ABNORMAL LOW
RDW: 13.4
RDW: 13.5
RDW: 13.7
WBC: 12.3 — ABNORMAL HIGH
WBC: 5.5
WBC: 8.6
WBC: 5.2
WBC: 5.9
WBC: 6.4

## 2011-03-19 LAB — DIFFERENTIAL
Basophils Absolute: 0
Basophils Absolute: 0
Basophils Absolute: 0
Basophils Relative: 0
Basophils Relative: 0
Basophils Relative: 0
Basophils Relative: 1
Basophils Relative: 1
Eosinophils Absolute: 0.1
Eosinophils Absolute: 0.3
Eosinophils Absolute: 0.3
Eosinophils Absolute: 0.3
Eosinophils Absolute: 0.5
Eosinophils Absolute: 0.6
Eosinophils Relative: 2
Eosinophils Relative: 3
Eosinophils Relative: 5
Eosinophils Relative: 5
Eosinophils Relative: 7 — ABNORMAL HIGH
Eosinophils Relative: 7 — ABNORMAL HIGH
Lymphocytes Relative: 50 — ABNORMAL HIGH
Lymphocytes Relative: 44
Lymphocytes Relative: 45
Lymphocytes Relative: 50 — ABNORMAL HIGH
Lymphocytes Relative: 34
Lymphs Abs: 2.1
Lymphs Abs: 2.7
Lymphs Abs: 2.4
Lymphs Abs: 2.8
Lymphs Abs: 2.8
Lymphs Abs: 3.8
Monocytes Absolute: 0.4
Monocytes Absolute: 0.5
Monocytes Absolute: 0.5
Monocytes Absolute: 0.5
Monocytes Relative: 3
Monocytes Relative: 6
Monocytes Relative: 7
Monocytes Relative: 7
Monocytes Relative: 9
Monocytes Relative: 4
Neutro Abs: 2.1
Neutro Abs: 7.1
Neutro Abs: 2
Neutro Abs: 2.6
Neutro Abs: 6.2
Neutrophils Relative %: 65
Neutrophils Relative %: 74
Neutrophils Relative %: 81 — ABNORMAL HIGH
Neutrophils Relative %: 40 — ABNORMAL LOW
Neutrophils Relative %: 55

## 2011-03-19 LAB — COMPREHENSIVE METABOLIC PANEL
ALT: 16
ALT: 41 — ABNORMAL HIGH
ALT: 15
AST: 26
AST: 40 — ABNORMAL HIGH
AST: 45 — ABNORMAL HIGH
Albumin: 3 — ABNORMAL LOW
Albumin: 3.4 — ABNORMAL LOW
Albumin: 3.4 — ABNORMAL LOW
Alkaline Phosphatase: 52
Alkaline Phosphatase: 55
BUN: 6
BUN: 2 — ABNORMAL LOW
CO2: 27
CO2: 28
Calcium: 8.6
Calcium: 8.7
Calcium: 9.2
Chloride: 106
Chloride: 106
Creatinine, Ser: 0.99
Creatinine, Ser: 1.04
Creatinine, Ser: 1.12
Creatinine, Ser: 1.06
GFR calc Af Amer: >60
GFR calc Af Amer: >60
GFR calc Af Amer: >60
GFR calc Af Amer: >60
GFR calc non Af Amer: 58 — ABNORMAL LOW
GFR calc non Af Amer: >60
GFR calc non Af Amer: >60
Glucose, Bld: 95
Glucose, Bld: 99
Glucose, Bld: 100 — ABNORMAL HIGH
Potassium: 3.8
Potassium: 4.3
Sodium: 138
Sodium: 139
Sodium: 142
Sodium: 142
Total Bilirubin: 0.4
Total Protein: 5.3 — ABNORMAL LOW
Total Protein: 5.7 — ABNORMAL LOW
Total Protein: 6.5

## 2011-03-19 LAB — URINALYSIS, ROUTINE W REFLEX MICROSCOPIC
Bilirubin Urine: NEGATIVE
Bilirubin Urine: NEGATIVE
Glucose, UA: NEGATIVE
Ketones, ur: NEGATIVE
Nitrite: NEGATIVE
Specific Gravity, Urine: <1.005 — ABNORMAL LOW
Urobilinogen, UA: 0.2
pH: 6

## 2011-03-19 LAB — HEPATIC FUNCTION PANEL
ALT: 35
ALT: 53 — ABNORMAL HIGH
AST: 30
AST: 46 — ABNORMAL HIGH
Albumin: 3.3 — ABNORMAL LOW
Albumin: 3.4 — ABNORMAL LOW
Alkaline Phosphatase: 51
Alkaline Phosphatase: 53
Bilirubin, Direct: <0.1
Bilirubin, Direct: <0.1
Total Bilirubin: 0.5
Total Bilirubin: 0.4
Total Protein: 5.6 — ABNORMAL LOW
Total Protein: 5.7 — ABNORMAL LOW

## 2011-03-19 LAB — URINE MICROSCOPIC-ADD ON

## 2011-03-19 LAB — BASIC METABOLIC PANEL
BUN: 6
BUN: 6
CO2: 26
CO2: 31
Calcium: 9
Chloride: 108
Creatinine, Ser: 1.06
GFR calc Af Amer: >60
Glucose, Bld: 104 — ABNORMAL HIGH
Potassium: 4.7

## 2011-03-19 LAB — STOOL CULTURE

## 2011-03-19 LAB — LIPASE, BLOOD: Lipase: 18

## 2011-03-19 LAB — H. PYLORI ANTIBODY, IGG: H Pylori IgG: 6.7 — ABNORMAL HIGH

## 2011-03-23 LAB — CBC
HCT: 41.2
Hemoglobin: 14
Hemoglobin: 14.7
MCHC: 34.9
MCHC: 35.6
MCV: 95.4
RBC: 4.26
RBC: 4.32
RDW: 13.1
WBC: 9.9

## 2011-03-23 LAB — BASIC METABOLIC PANEL
CO2: 22
Calcium: 9.8
Chloride: 106
Creatinine, Ser: 0.81
GFR calc Af Amer: >60
Sodium: 138

## 2011-03-23 LAB — COMPREHENSIVE METABOLIC PANEL
BUN: 16
CO2: 24
Calcium: 9.2
GFR calc non Af Amer: >60
Glucose, Bld: 97
Total Protein: 6

## 2011-03-23 LAB — SALICYLATE LEVEL: Salicylate Lvl: <4

## 2011-03-23 LAB — DIFFERENTIAL
Basophils Relative: 2 — ABNORMAL HIGH
Eosinophils Absolute: 0.4
Lymphs Abs: 3.3
Monocytes Relative: 3
Neutro Abs: 4.6
Neutrophils Relative %: 52

## 2011-03-23 LAB — RAPID URINE DRUG SCREEN, HOSP PERFORMED
Amphetamines: NOT DETECTED
Barbiturates: NOT DETECTED
Benzodiazepines: POSITIVE — AB
Cocaine: NOT DETECTED
Opiates: NOT DETECTED

## 2011-03-24 LAB — HEMOGLOBIN AND HEMATOCRIT, BLOOD
HCT: 39.2
Hemoglobin: 13.5

## 2011-03-26 LAB — CBC
HCT: 40.5 % (ref 36.0–46.0)
Hemoglobin: 13.9 g/dL (ref 12.0–15.0)
MCHC: 34.2 g/dL (ref 30.0–36.0)
MCV: 95.6 fL (ref 78.0–100.0)
RBC: 4.24 MIL/uL (ref 3.87–5.11)
WBC: 9.8 10*3/uL (ref 4.0–10.5)

## 2011-03-26 LAB — DIFFERENTIAL
Basophils Absolute: 0.1 10*3/uL (ref 0.0–0.1)
Eosinophils Absolute: 0.3 10*3/uL (ref 0.0–0.7)
Eosinophils Relative: 3 % (ref 0–5)
Lymphocytes Relative: 35 % (ref 12–46)
Neutrophils Relative %: 56 % (ref 43–77)

## 2011-03-26 LAB — COMPREHENSIVE METABOLIC PANEL
AST: 17 U/L (ref 0–37)
BUN: 17 mg/dL (ref 6–23)
CO2: 25 mEq/L (ref 19–32)
Calcium: 9.7 mg/dL (ref 8.4–10.5)
Chloride: 108 mEq/L (ref 96–112)
Creatinine, Ser: 0.93 mg/dL (ref 0.4–1.2)
GFR calc Af Amer: >60 mL/min (ref >60)
GFR calc non Af Amer: >60 mL/min (ref >60)
Glucose, Bld: 94 mg/dL (ref 70–99)
Total Bilirubin: 0.3 mg/dL (ref 0.3–1.2)

## 2011-03-26 LAB — URINALYSIS, ROUTINE W REFLEX MICROSCOPIC
Bilirubin Urine: NEGATIVE
Glucose, UA: NEGATIVE mg/dL
Hgb urine dipstick: NEGATIVE
Specific Gravity, Urine: 1.02 (ref 1.005–1.030)
pH: 5 (ref 5.0–8.0)

## 2011-03-26 LAB — LIPASE, BLOOD: Lipase: 26 U/L (ref 11–59)

## 2011-06-01 ENCOUNTER — Emergency Department (HOSPITAL_COMMUNITY)
Admission: EM | Admit: 2011-06-01 | Discharge: 2011-06-01 | Disposition: A | Discharge status: Home / Self Care | Payer: Medicaid Other | Attending: Emergency Medicine | Admitting: Emergency Medicine

## 2011-06-01 ENCOUNTER — Encounter (HOSPITAL_COMMUNITY): Payer: Self-pay

## 2011-06-01 DIAGNOSIS — F17200 Nicotine dependence, unspecified, uncomplicated: Secondary | ICD-10-CM | POA: Insufficient documentation

## 2011-06-01 DIAGNOSIS — R110 Nausea: Secondary | ICD-10-CM | POA: Insufficient documentation

## 2011-06-01 DIAGNOSIS — L299 Pruritus, unspecified: Secondary | ICD-10-CM | POA: Insufficient documentation

## 2011-06-01 DIAGNOSIS — Z9079 Acquired absence of other genital organ(s): Secondary | ICD-10-CM | POA: Insufficient documentation

## 2011-06-01 DIAGNOSIS — E039 Hypothyroidism, unspecified: Secondary | ICD-10-CM | POA: Insufficient documentation

## 2011-06-01 DIAGNOSIS — E079 Disorder of thyroid, unspecified: Secondary | ICD-10-CM | POA: Insufficient documentation

## 2011-06-01 DIAGNOSIS — R51 Headache: Secondary | ICD-10-CM | POA: Insufficient documentation

## 2011-06-01 DIAGNOSIS — K589 Irritable bowel syndrome without diarrhea: Secondary | ICD-10-CM | POA: Insufficient documentation

## 2011-06-01 DIAGNOSIS — R21 Rash and other nonspecific skin eruption: Secondary | ICD-10-CM

## 2011-06-01 HISTORY — DX: Disorder of thyroid, unspecified: E07.9

## 2011-06-01 HISTORY — DX: Irritable bowel syndrome, unspecified: K58.9

## 2011-06-01 MED ORDER — ACYCLOVIR 400 MG PO TABS: 400.0000 mg | ORAL_TABLET | Freq: Three times a day (TID) | ORAL | Status: DC

## 2011-06-01 MED ORDER — OXYCODONE-ACETAMINOPHEN 5-325 MG PO TABS: 1.0000 | ORAL_TABLET | Freq: Four times a day (QID) | ORAL | Status: AC | PRN

## 2011-06-01 NOTE — ED Provider Notes (Signed)
Scribed for Alyssa Gaskins, MD, the patient was seen in room [facility] . This chart was scribed by Ellie Lunch.   CSN: [id number] Arrival date & time: 06/01/2011  5:53 PM   First MD Initiated Contact with Patient 06/01/11 1824      Chief Complaint  Patient presents with  . Nausea  . Rash  . Headache     Patient is a 40 y.o. female presenting with rash.  Rash  This is a new problem. The current episode started more than 2 days ago. The problem has been gradually worsening. The problem is associated with an unknown factor. There has been no fever. Affected Location: anterior chest. The pain is at a severity of 5/10. Associated symptoms include itching and pain. She has tried nothing for the symptoms. The treatment provided no relief.  Pt c/o of rash in chest started 4 days ago. Rash is described as burning, painful, and gradually worsening. Rash is associated with HA and nausea. Pt denies difficulty breathing or swallowing.  No h/o no new exposures. No recent fever.  No sick contacts. No h/o allergic rxns.  Has had chicken pox in past.   Past Medical History  Diagnosis Date  . Anxiety   . IBS (irritable bowel syndrome)   . Thyroid disease   hypothyroidism   Past Surgical History  Procedure Date  . Abdominal hysterectomy   . Appendectomy   . Cholecystectomy   . Knee arthroscopy     No family history on file.  History  Substance Use Topics  . Smoking status: Current Everyday Smoker    Types: Cigarettes  . Smokeless tobacco: Not on file  . Alcohol Use: No    Review of Systems  Constitutional: Negative for fever.  HENT: Negative for trouble swallowing.   Respiratory: Negative for shortness of breath.   Gastrointestinal: Positive for nausea.  Skin: Positive for itching and rash.  Neurological: Positive for headaches.  All other systems reviewed and are negative.   Allergies  Morphine and related  Home Medications   Current Outpatient Rx  Name Route Sig  Dispense Refill  . ALPRAZOLAM 1 MG PO TABS Oral Take 1 mg by mouth 2 (two) times daily as needed. For anxiety     . B-12 PO Oral Take 1 tablet by mouth daily.      Marland Kitchen HYOSCYAMINE SULFATE 0.125 MG SL SUBL Sublingual Place 0.125 mg under the tongue every 6 (six) hours as needed. For IBS     . OMEPRAZOLE 20 MG PO CPDR Oral Take 20 mg by mouth daily as needed. For acid reflux       BP 126/103  Pulse 74  Temp(Src) 98.2 F (36.8 C) (Oral)  Resp 18  Ht 5\' 6"  (1.676 m)  Wt 138 lb (62.596 kg)  BMI 22.27 kg/m2  SpO2 100%  Physical Exam CONSTITUTIONAL: Well developed/well nourished HEAD AND FACE: Normocephalic/atraumatic EYES: EOMI/PERRL ENMT: Mucous membranes moist NECK: supple no meningeal signs CV: S1/S2 noted, no murmurs/rubs/gallops noted LUNGS: Lungs are clear to auscultation bilaterally, no apparent distress ABDOMEN: soft, nontender, no rebound or guarding GU:no cva tenderness NEURO: Pt is awake/alert, moves all extremitiesx4 EXTREMITIES: pulses normal, full ROM SKIN: warm,  On R breast three small vesicles with redness but no drainage.  Chaperone present PSYCH: no abnormalities of mood noted  ED Course  Procedures  DIAGNOSTIC STUDIES: Oxygen Saturation is 100% on room air, normal by my interpretation.    COORDINATION OF CARE:  Will start acyclovir as this  could be early zoster Pt is well appearing and no distress  MDM  Nursing notes reviewed and considered in documentation   I personally performed the services described in this documentation, which was scribed in my presence. The recorded information has been reviewed and considered.           Alyssa Gaskins, MD 06/02/11 [phone]

## 2011-06-01 NOTE — ED Notes (Signed)
C/o rash in between breast. 4 small red blister like areas present. Pt c/o burning sensation that radiates. Now also has burning sensation to rt armpit. Also c/o nausea as well.

## 2011-06-01 NOTE — ED Notes (Signed)
Negative pressure room checked by [facility].

## 2011-06-01 NOTE — ED Notes (Signed)
Pt states itching started 4 days ago, then a blister appeared the next day with burning and has progressed today into several blisters. Blister like rash is in between breast with pain/burning radiating toward rt rib rib cage.  No rash pain or burning reported on left side of torso.  Pt attempted to see PCP without success.  Pt denies fever. States has a headache and occasional cough.

## 2011-07-06 ENCOUNTER — Emergency Department (HOSPITAL_COMMUNITY)
Admission: EM | Admit: 2011-07-06 | Discharge: 2011-07-06 | Disposition: A | Discharge status: Home / Self Care | Payer: Medicaid Other | Attending: Emergency Medicine | Admitting: Emergency Medicine

## 2011-07-06 ENCOUNTER — Encounter (HOSPITAL_COMMUNITY): Payer: Self-pay | Admitting: *Deleted

## 2011-07-06 ENCOUNTER — Emergency Department (HOSPITAL_COMMUNITY): Payer: Medicaid Other

## 2011-07-06 DIAGNOSIS — Z79899 Other long term (current) drug therapy: Secondary | ICD-10-CM | POA: Insufficient documentation

## 2011-07-06 DIAGNOSIS — F17200 Nicotine dependence, unspecified, uncomplicated: Secondary | ICD-10-CM | POA: Insufficient documentation

## 2011-07-06 DIAGNOSIS — R209 Unspecified disturbances of skin sensation: Secondary | ICD-10-CM | POA: Insufficient documentation

## 2011-07-06 DIAGNOSIS — M5416 Radiculopathy, lumbar region: Secondary | ICD-10-CM

## 2011-07-06 DIAGNOSIS — E079 Disorder of thyroid, unspecified: Secondary | ICD-10-CM | POA: Insufficient documentation

## 2011-07-06 DIAGNOSIS — M545 Low back pain, unspecified: Secondary | ICD-10-CM | POA: Insufficient documentation

## 2011-07-06 DIAGNOSIS — F411 Generalized anxiety disorder: Secondary | ICD-10-CM | POA: Insufficient documentation

## 2011-07-06 DIAGNOSIS — M25559 Pain in unspecified hip: Secondary | ICD-10-CM | POA: Insufficient documentation

## 2011-07-06 DIAGNOSIS — IMO0002 Reserved for concepts with insufficient information to code with codable children: Secondary | ICD-10-CM | POA: Insufficient documentation

## 2011-07-06 DIAGNOSIS — K589 Irritable bowel syndrome without diarrhea: Secondary | ICD-10-CM | POA: Insufficient documentation

## 2011-07-06 MED ORDER — PREDNISONE 50 MG PO TABS: ORAL_TABLET | ORAL | Status: AC

## 2011-07-06 MED ORDER — PREDNISONE 20 MG PO TABS
60.0000 mg | ORAL_TABLET | Freq: Once | ORAL | Status: AC
  Administered 2011-07-06: 60 mg via ORAL
  Filled 2011-07-06: qty 3

## 2011-07-06 MED ORDER — HYDROCODONE-ACETAMINOPHEN 5-325 MG PO TABS: 1.0000 | ORAL_TABLET | Freq: Four times a day (QID) | ORAL | Status: AC | PRN

## 2011-07-06 MED ORDER — HYDROCODONE-ACETAMINOPHEN 5-325 MG PO TABS
1.0000 | ORAL_TABLET | Freq: Once | ORAL | Status: AC
  Administered 2011-07-06: 1 via ORAL
  Filled 2011-07-06: qty 1

## 2011-07-06 NOTE — ED Provider Notes (Signed)
Medical screening examination/treatment/procedure(s) were performed by non-physician practitioner and as supervising physician I was immediately available for consultation/collaboration.  Donnetta Hutching, MD 07/06/11 [phone]

## 2011-07-06 NOTE — ED Notes (Signed)
Pt. [place] and water was not as deep as pt thought and landed on her bottom, c/o pain to tailbone and left hip area, states that pain radiates down left leg

## 2011-07-06 NOTE — ED Provider Notes (Signed)
History     CSN: [id number]  Arrival date & time 07/06/11  1349   None     Chief Complaint  Patient presents with  . Hip Pain    (Consider location/radiation/quality/duration/timing/severity/associated sxs/prior treatment) HPI Comments: [place].  She thought she was jumping into deep water buttocks first but it was only about 6 inches deep.  Her lower back has hurt since and she has burning pain radiating to her L foot.  Patient is a 41 y.o. female presenting with hip pain. The history is provided by the patient. No language interpreter was used.  Hip Pain This is a new problem. Episode onset: 2 days ago. The problem occurs constantly. The problem has been unchanged. Associated symptoms include numbness. The symptoms are aggravated by walking, twisting and standing. She has tried NSAIDs for the symptoms. The treatment provided no relief.    Past Medical History  Diagnosis Date  . Anxiety   . IBS (irritable bowel syndrome)   . Thyroid disease     Past Surgical History  Procedure Date  . Abdominal hysterectomy   . Appendectomy   . Cholecystectomy   . Knee arthroscopy     No family history on file.  History  Substance Use Topics  . Smoking status: Current Everyday Smoker    Types: Cigarettes  . Smokeless tobacco: Not on file  . Alcohol Use: No    OB History    Grav Para Term Preterm Abortions TAB SAB Ect Mult Living                  Review of Systems  Musculoskeletal: Positive for back pain.  Neurological: Positive for numbness.  All other systems reviewed and are negative.    Allergies  Morphine and related  Home Medications   Current Outpatient Rx  Name Route Sig Dispense Refill  . ALPRAZOLAM 1 MG PO TABS Oral Take 1 mg by mouth 2 (two) times daily as needed. For anxiety     . GOODY HEADACHE PO Oral Take 1 packet by mouth daily as needed. For pain     . RA SINUS TABLETS EX ST PO Oral Take 2 tablets by mouth as needed. For congestion relief     . B-12 PO Oral Take 1 tablet by mouth daily.      Marland Kitchen HYOSCYAMINE SULFATE 0.125 MG SL SUBL Sublingual Place 0.125 mg under the tongue every 6 (six) hours as needed. For IBS     . IBUPROFEN 200 MG PO TABS Oral Take 200-800 mg by mouth 2 (two) times daily as needed. For pain    . LEVOTHYROXINE SODIUM 100 MCG PO TABS Oral Take 100 mcg by mouth every morning.      Marland Kitchen OMEPRAZOLE 20 MG PO CPDR Oral Take 20 mg by mouth daily as needed. For acid reflux       BP 128/84  Pulse 108  Temp(Src) 98.1 F (36.7 C) (Oral)  Resp 21  Ht 5\' 6"  (1.676 m)  Wt 148 lb (67.132 kg)  BMI 23.89 kg/m2  SpO2 100%  Physical Exam  Nursing note and vitals reviewed. Constitutional: She is oriented to person, place, and time. She appears well-developed and well-nourished. No distress.  HENT:  Head: Normocephalic and atraumatic.  Eyes: EOM are normal.  Neck: Normal range of motion.  Cardiovascular: Normal rate, regular rhythm and normal heart sounds.   Pulmonary/Chest: Effort normal and breath sounds normal.  Abdominal: Soft. She exhibits no distension. There is  no tenderness.  Musculoskeletal: She exhibits tenderness.       Lumbar back: She exhibits decreased range of motion, tenderness and pain. She exhibits no bony tenderness, no swelling, no edema, no deformity, no laceration, no spasm and normal pulse.       Back:  Neurological: She is alert and oriented to person, place, and time. GCS eye subscore is 4. GCS verbal subscore is 5. GCS motor subscore is 6.  Reflex Scores:      Patellar reflexes are 2+ on the right side and 2+ on the left side.      Achilles reflexes are 2+ on the right side and 2+ on the left side. Skin: Skin is warm and dry.  Psychiatric: She has a normal mood and affect. Judgment normal.    ED Course  Procedures (including critical care time)  Labs Reviewed - No data to display Dg Lumbar Spine Complete  07/06/2011  *RADIOLOGY REPORT*  Clinical Data: Fall, pain.  LUMBAR SPINE - COMPLETE  4+ VIEW  Comparison: CT abdomen and pelvis 07/09/2010.  Findings: There are five lumbar-type vertebral bodies.  No fracture or malalignment.  Disc spaces well maintained.  SI joints are symmetric.  Prior cholecystectomy.  IMPRESSION: No acute findings.  Original Report Authenticated By: Cyndie Chime, M.D.   Dg Hip Complete Left  07/06/2011  *RADIOLOGY REPORT*  Clinical Data:  Pain post fall  LEFT HIP - COMPLETE 2+ VIEW  Comparison: 11/11/2006  Findings: Symmetric hip and SI joints. Osseous mineralization grossly normal. No acute fracture, dislocation or bone destruction. Soft tissues unremarkable.  IMPRESSION: Normal exam.  Original Report Authenticated By: Lollie Marrow, M.D.     No diagnosis found.    MDM          Worthy Rancher, PA 07/06/11 [phone]

## 2011-07-06 NOTE — ED Notes (Signed)
Patient has called for pain meds and [name], [place] is aware of request

## 2011-07-06 NOTE — ED Notes (Signed)
C/o left hip pain-[place] 4[place], landing on left hip; states jumped into approx 6 inches of water

## 2011-08-30 ENCOUNTER — Encounter (HOSPITAL_COMMUNITY): Payer: Self-pay | Admitting: Emergency Medicine

## 2011-08-30 ENCOUNTER — Inpatient Hospital Stay (HOSPITAL_COMMUNITY)
Admission: EM | Admit: 2011-08-30 | Discharge: 2011-09-01 | DRG: 440 | Disposition: A | Discharge status: Home / Self Care | Payer: Self-pay | Source: Ambulatory Visit | Attending: Internal Medicine | Admitting: Internal Medicine

## 2011-08-30 ENCOUNTER — Inpatient Hospital Stay (HOSPITAL_COMMUNITY): Payer: Self-pay

## 2011-08-30 DIAGNOSIS — K5730 Diverticulosis of large intestine without perforation or abscess without bleeding: Secondary | ICD-10-CM | POA: Diagnosis present

## 2011-08-30 DIAGNOSIS — K7689 Other specified diseases of liver: Secondary | ICD-10-CM | POA: Diagnosis present

## 2011-08-30 DIAGNOSIS — K760 Fatty (change of) liver, not elsewhere classified: Secondary | ICD-10-CM | POA: Diagnosis present

## 2011-08-30 DIAGNOSIS — F411 Generalized anxiety disorder: Secondary | ICD-10-CM | POA: Diagnosis present

## 2011-08-30 DIAGNOSIS — E039 Hypothyroidism, unspecified: Secondary | ICD-10-CM | POA: Diagnosis present

## 2011-08-30 DIAGNOSIS — R112 Nausea with vomiting, unspecified: Secondary | ICD-10-CM | POA: Diagnosis present

## 2011-08-30 DIAGNOSIS — R109 Unspecified abdominal pain: Secondary | ICD-10-CM | POA: Diagnosis present

## 2011-08-30 DIAGNOSIS — D72829 Elevated white blood cell count, unspecified: Secondary | ICD-10-CM | POA: Diagnosis present

## 2011-08-30 DIAGNOSIS — K589 Irritable bowel syndrome without diarrhea: Secondary | ICD-10-CM | POA: Diagnosis present

## 2011-08-30 DIAGNOSIS — K859 Acute pancreatitis without necrosis or infection, unspecified: Principal | ICD-10-CM | POA: Diagnosis present

## 2011-08-30 DIAGNOSIS — F419 Anxiety disorder, unspecified: Secondary | ICD-10-CM | POA: Diagnosis present

## 2011-08-30 LAB — COMPREHENSIVE METABOLIC PANEL
ALT: 97 U/L — ABNORMAL HIGH (ref 0–35)
AST: 125 U/L — ABNORMAL HIGH (ref 0–37)
Albumin: 4.3 g/dL (ref 3.5–5.2)
Alkaline Phosphatase: 63 U/L (ref 39–117)
Chloride: 106 mEq/L (ref 96–112)
Potassium: 3.4 mEq/L — ABNORMAL LOW (ref 3.5–5.1)
Total Bilirubin: 0.2 mg/dL — ABNORMAL LOW (ref 0.3–1.2)

## 2011-08-30 LAB — DIFFERENTIAL
Basophils Absolute: 0 10*3/uL (ref 0.0–0.1)
Basophils Relative: 0 % (ref 0–1)
Lymphocytes Relative: 30 % (ref 12–46)
Neutro Abs: 8.6 10*3/uL — ABNORMAL HIGH (ref 1.7–7.7)
Neutrophils Relative %: 65 % (ref 43–77)

## 2011-08-30 LAB — URINALYSIS, ROUTINE W REFLEX MICROSCOPIC
Bilirubin Urine: NEGATIVE
Glucose, UA: NEGATIVE mg/dL
Hgb urine dipstick: NEGATIVE
Ketones, ur: NEGATIVE mg/dL
Leukocytes, UA: NEGATIVE
pH: 5.5 (ref 5.0–8.0)

## 2011-08-30 LAB — CBC
Hemoglobin: 13.2 g/dL (ref 12.0–15.0)
MCHC: 35.6 g/dL (ref 30.0–36.0)
RDW: 12.6 % (ref 11.5–15.5)
WBC: 13.4 10*3/uL — ABNORMAL HIGH (ref 4.0–10.5)

## 2011-08-30 MED ORDER — SODIUM CHLORIDE 0.9 % IV SOLN
INTRAVENOUS | Status: DC
  Administered 2011-08-30: 13:00:00 via INTRAVENOUS
  Administered 2011-08-30 (×2): 1000 mL via INTRAVENOUS
  Administered 2011-08-31 – 2011-09-01 (×3): via INTRAVENOUS

## 2011-08-30 MED ORDER — HYDROMORPHONE HCL PF 1 MG/ML IJ SOLN: 1.0000 mg | INTRAMUSCULAR | Status: AC | PRN

## 2011-08-30 MED ORDER — LEVOTHYROXINE SODIUM 100 MCG PO TABS
100.0000 ug | ORAL_TABLET | Freq: Every day | ORAL | Status: DC
  Administered 2011-08-30 – 2011-09-01 (×3): 100 ug via ORAL
  Filled 2011-08-30 (×4): qty 1

## 2011-08-30 MED ORDER — HYDROMORPHONE HCL PF 1 MG/ML IJ SOLN
INTRAMUSCULAR | Status: AC
  Administered 2011-08-30: 02:00:00
  Filled 2011-08-30: qty 1

## 2011-08-30 MED ORDER — GI COCKTAIL ~~LOC~~
30.0000 mL | Freq: Once | ORAL | Status: AC
  Administered 2011-08-30: 30 mL via ORAL
  Filled 2011-08-30: qty 30

## 2011-08-30 MED ORDER — HEPARIN SODIUM (PORCINE) 5000 UNIT/ML IJ SOLN
5000.0000 [IU] | Freq: Three times a day (TID) | INTRAMUSCULAR | Status: DC
  Administered 2011-08-30 – 2011-09-01 (×7): 5000 [IU] via SUBCUTANEOUS
  Filled 2011-08-30 (×10): qty 1

## 2011-08-30 MED ORDER — ONDANSETRON HCL 4 MG PO TABS: 4.0000 mg | ORAL_TABLET | Freq: Four times a day (QID) | ORAL | Status: DC | PRN

## 2011-08-30 MED ORDER — ONDANSETRON HCL 4 MG/2ML IJ SOLN
4.0000 mg | Freq: Four times a day (QID) | INTRAMUSCULAR | Status: DC | PRN
  Administered 2011-08-30 – 2011-09-01 (×7): 4 mg via INTRAVENOUS
  Filled 2011-08-30 (×8): qty 2

## 2011-08-30 MED ORDER — ACETAMINOPHEN 650 MG RE SUPP: 650.0000 mg | Freq: Four times a day (QID) | RECTAL | Status: DC | PRN

## 2011-08-30 MED ORDER — HYDROMORPHONE HCL PF 1 MG/ML IJ SOLN
1.0000 mg | INTRAMUSCULAR | Status: DC | PRN
  Administered 2011-08-30 – 2011-09-01 (×13): 1 mg via INTRAVENOUS
  Filled 2011-08-30 (×14): qty 1

## 2011-08-30 MED ORDER — ACETAMINOPHEN 325 MG PO TABS
650.0000 mg | ORAL_TABLET | Freq: Four times a day (QID) | ORAL | Status: DC | PRN
  Administered 2011-08-31 – 2011-09-01 (×2): 650 mg via ORAL
  Filled 2011-08-30 (×2): qty 2

## 2011-08-30 MED ORDER — SODIUM CHLORIDE 0.9 % IV SOLN: INTRAVENOUS | Status: AC

## 2011-08-30 MED ORDER — HYDROMORPHONE HCL PF 1 MG/ML IJ SOLN: 1.0000 mg | Freq: Once | INTRAMUSCULAR | Status: AC

## 2011-08-30 MED ORDER — HYDROMORPHONE HCL PF 2 MG/ML IJ SOLN
2.0000 mg | Freq: Once | INTRAMUSCULAR | Status: AC
  Administered 2011-08-30: 2 mg via INTRAVENOUS
  Filled 2011-08-30: qty 1

## 2011-08-30 NOTE — ED Provider Notes (Signed)
History     CSN: [id number]  Arrival date & time 08/30/11  3474   First MD Initiated Contact with Patient 08/30/11 0117      Chief Complaint  Patient presents with  . Abdominal Pain    (Consider location/radiation/quality/duration/timing/severity/associated sxs/prior treatment) HPI Comments: 41 year old female with a history of anxiety, irritable bowel syndrome, cholecystitis, appendicitis, hysterectomy presents with recurrent right upper quadrant chronic abdominal pain. She states that she was at her own bachelorette party this evening had been drinking heavily and developed gradual onset of gradually worsening crampy right upper quadrant pain. This is not associated with nausea vomiting or changes in bowel habits, she has had no urinary burning, suprapubic pain, vaginal discharge or bleeding. She states this is similar to the patient's head in the past intermittently. She did take 1 tablet of Bentyl tetrahydrozoline prior to arrival with no improvement.  Patient is a 41 y.o. female presenting with abdominal pain. The history is provided by the patient and the EMS personnel.  Abdominal Pain The primary symptoms of the illness include abdominal pain.    Past Medical History  Diagnosis Date  . Anxiety   . IBS (irritable bowel syndrome)   . Thyroid disease   . Diverticula of intestine   . Ulcerative colitis     Past Surgical History  Procedure Date  . Abdominal hysterectomy   . Appendectomy   . Cholecystectomy   . Knee arthroscopy     History reviewed. No pertinent family history.  History  Substance Use Topics  . Smoking status: Current Everyday Smoker    Types: Cigarettes  . Smokeless tobacco: Not on file  . Alcohol Use: Yes     socially    OB History    Grav Para Term Preterm Abortions TAB SAB Ect Mult Living                  Review of Systems  Gastrointestinal: Positive for abdominal pain.  All other systems reviewed and are negative.    Allergies    Morphine and related  Home Medications   Current Outpatient Rx  Name Route Sig Dispense Refill  . ALPRAZOLAM 1 MG PO TABS Oral Take 1 mg by mouth 2 (two) times daily as needed. For anxiety     . GOODY HEADACHE PO Oral Take 1 packet by mouth daily as needed. For pain     . RA SINUS TABLETS EX ST PO Oral Take 2 tablets by mouth as needed. For congestion relief    . B-12 PO Oral Take 1 tablet by mouth daily.      Marland Kitchen HYOSCYAMINE SULFATE 0.125 MG SL SUBL Sublingual Place 0.125 mg under the tongue every 6 (six) hours as needed. For IBS     . IBUPROFEN 200 MG PO TABS Oral Take 200-800 mg by mouth 2 (two) times daily as needed. For pain    . LEVOTHYROXINE SODIUM 100 MCG PO TABS Oral Take 100 mcg by mouth every morning.      Marland Kitchen OMEPRAZOLE 20 MG PO CPDR Oral Take 20 mg by mouth daily as needed. For acid reflux     . POLYETHYLENE GLYCOL 3350 PO POWD Oral Take 17 g by mouth as needed. For constipation.    . TRAZODONE HCL 100 MG PO TABS Oral Take 100-200 mg by mouth at bedtime.      BP 158/127  Temp(Src) 98.5 F (36.9 C) (Oral)  Resp 30  SpO2 95%  Physical Exam  Nursing note and vitals  reviewed. Constitutional: She appears well-developed and well-nourished. No distress.       Uncomfortable appearing  HENT:  Head: Normocephalic and atraumatic.  Mouth/Throat: Oropharynx is clear and moist. No oropharyngeal exudate.  Eyes: Conjunctivae and EOM are normal. Pupils are equal, round, and reactive to light. Right eye exhibits no discharge. Left eye exhibits no discharge. No scleral icterus.  Neck: Normal range of motion. Neck supple. No JVD present. No thyromegaly present.  Cardiovascular: Normal rate, regular rhythm, normal heart sounds and intact distal pulses.  Exam reveals no gallop and no friction rub.   No murmur heard. Pulmonary/Chest: Effort normal and breath sounds normal. No respiratory distress. She has no wheezes. She has no rales.  Abdominal: Soft. Bowel sounds are normal. She exhibits no  distension and no mass. There is tenderness ( Mild right upper quadrant tenderness, no Murphy's sign, no other tenderness, non-peritoneal).  Musculoskeletal: Normal range of motion. She exhibits no edema and no tenderness.  Lymphadenopathy:    She has no cervical adenopathy.  Neurological: She is alert. Coordination normal.  Skin: Skin is warm and dry. No rash noted. No erythema.  Psychiatric: She has a normal mood and affect. Her behavior is normal.    ED Course  Procedures (including critical care time)  Labs Reviewed  CBC - Abnormal; Notable for the following:    WBC 13.4 (*)    All other components within normal limits  DIFFERENTIAL - Abnormal; Notable for the following:    Neutro Abs 8.6 (*)    All other components within normal limits  COMPREHENSIVE METABOLIC PANEL - Abnormal; Notable for the following:    Potassium 3.4 (*)    Glucose, Bld 105 (*)    AST 125 (*)    ALT 97 (*)    Total Bilirubin 0.2 (*)    GFR calc non Af Amer 73 (*)    GFR calc Af Amer 84 (*)    All other components within normal limits  LIPASE, BLOOD - Abnormal; Notable for the following:    Lipase 443 (*)    All other components within normal limits  URINALYSIS, ROUTINE W REFLEX MICROSCOPIC - Abnormal; Notable for the following:    Color, Urine STRAW (*)    All other components within normal limits  ETHANOL - Abnormal; Notable for the following:    Alcohol, Ethyl (B) 217 (*)    All other components within normal limits   No results found.   1. Acute pancreatitis       MDM  Mild tenderness, patient reports history of elevated LFTs with recurrent right upper quadrant pain. After heavy alcohol intake would question acute pancreatitis versus gastritis, peptic ulcer disease. We'll treat with intravenous medications, check lipase, compressive metabolic panel, reevaluate    Laboratory evaluation confirmed that the patient has acute pancreatitis with a lipase of over 400, slightly elevated liver  function tests around 100 in an alcohol level of 200 with a white blood count of 13,400. Patient has had some improvement with intravenous hydromorphone, but has persistent significant pain. We'll admit to the hospital for ongoing pain control, fluids, maintain n.p.o. Status.  Are discussed with the admitting doctor Dr. Kaylyn Layer who has given temporary holding orders.  Vida Roller, MD 08/30/11 [phone]

## 2011-08-30 NOTE — H&P (Signed)
PCP:  [name], MD, MD  Dr. Kassie Mends, gastroenterologist.  Chief Complaint:  Epigastric pain  HPI: 41yoF with unclear h/o ulcerative colitis, IBS, hypothyroidism presents with pancreatitis, minimal  hepatitis  Pt is reliable historian, states that 2d ago developed minimal epigastric discomfort radiating  into low midline abdomen, but nowhere else. She continued about her daily business including  preparing for her birthday which was yesterday and bridal shower which was tonight. Yesterday, her  birthday, she had no epigastric pain at all, and celebrated her birthday, including only two  alcoholic drinks. Today, she while at dinner for her bridal shower, the epigastric pain started  getting worse, for which she took a hyoscyamine (which she occasionally takes for IBS pain),  however through the night it continued to worsen so she was brought to the ED. She states she had  a rum and coke and shot of tequila, possibly for a total of 4-6 drinks through the night.   In the ED, pt was afebrile, but hypertense to 158/127. Labs showed hypoK 3.4, normal renal. Lipase  443, AST 125 / ALT 97. WBC 13.4 normal diff, otherwise CBC normal. UA negative. Alcohol level 217.  Hip and lumbar plain films negative. Pt has been given 1mg  Dilaudid.  Pt states she has only had 3 beers in the time since Christmas/New Year's and is adamant that she  does not drink heavily, or even regularly, but only yesterday and today for the celebrations. She  had some nausea, but no vomiting, and no change in bowel habits. She had a cholecytectomy a few  years ago, and has an unclear h/o UC but denies having ever been typical UC meds. She denies any  other complaints, no cardiopulmonary symptoms, ROS o/w negative.     Past Medical History  Diagnosis Date  . Anxiety   . IBS (irritable bowel syndrome)   . Thyroid disease   . Diverticula of intestine   . Ulcerative colitis     Past Surgical History  Procedure  Date  . Abdominal hysterectomy   . Appendectomy   . Cholecystectomy   . Knee arthroscopy     Medications:  HOME MEDS: Reconciled with pt  Prior to Admission medications   Medication Sig Start Date End Date Taking? Authorizing Provider  ALPRAZolam Prudy Feeler) 1 MG tablet Take 1 mg by mouth 2 (two) times daily as needed. For anxiety    Yes Historical Provider, MD  Aspirin-Acetaminophen-Caffeine (GOODY HEADACHE PO) Take 1 packet by mouth daily as needed. For pain    Yes Historical Provider, MD  Chlorphen-Pseudoephed-APAP (RA SINUS TABLETS EX ST PO) Take 2 tablets by mouth as needed. For congestion relief   Yes Historical Provider, MD  Cyanocobalamin (B-12 PO) Take 1 tablet by mouth daily.     Yes Historical Provider, MD  hyoscyamine (LEVSIN SL) 0.125 MG SL tablet Place 0.125 mg under the tongue every 6 (six) hours as needed. For IBS    Yes Historical Provider, MD  ibuprofen (ADVIL,MOTRIN) 200 MG tablet Take 200-800 mg by mouth 2 (two) times daily as needed. For pain   Yes Historical Provider, MD  levothyroxine (SYNTHROID, LEVOTHROID) 100 MCG tablet Take 100 mcg by mouth every morning.     Yes Historical Provider, MD  omeprazole (PRILOSEC) 20 MG capsule Take 20 mg by mouth daily as needed. For acid reflux    Yes Historical Provider, MD  polyethylene glycol powder (GLYCOLAX/MIRALAX) powder Take 17 g by mouth as needed. For constipation.   Yes Historical  Provider, MD  traZODone (DESYREL) 100 MG tablet Take 100-200 mg by mouth at bedtime.   Yes Historical Provider, MD    Allergies:  Allergies  Allergen Reactions  . Morphine And Related Itching and Rash    Social History:   reports that she has been smoking Cigarettes.  She has a 20 pack-year smoking history. She does not have any smokeless tobacco history on file. She reports that she drinks alcohol. She reports that she does not use illicit drugs.  Family History: Family History  Problem Relation Age of Onset  . Diabetes Mother   . COPD  Mother   . Heart disease Mother   . Breast cancer Paternal Grandmother   . Lung cancer Paternal Grandfather     Physical Exam: Filed Vitals:   08/30/11 0059  BP: 158/127  Temp: 98.5 F (36.9 C)  TempSrc: Oral  Resp: 30  SpO2: 95%   Blood pressure 158/127, temperature 98.5 F (36.9 C), temperature source Oral, resp. rate 30, SpO2 95.00%. Gen: Young, healthy appearing F, crying in pain, but is distractable and able to give history  well. Breathing comfortably. HEENT: Pupils round and reactive, EOMI, sclera clear without icterus, conjunctival injection.  Mouth moist and normal appearing Lungs: CTAB no w/c/r, good air movement Heart: Regular, not tachycardic no m/g, overall normal Abd: Soft, not peritoneal or rigid, not obese, with TTP epigastrically and low midline, but not  TTP in the other quadrants. BS hypoactive.  Extrem: Warm, perfusing well, good bulk and tone, no BLE edema, normal exam Neuro: Alert, attentive, conversant, CN 2-12, moving around the ED bed without difficulty, grossly  non-focal    Labs & Imaging Results for orders placed during the hospital encounter of 08/30/11 (from the past 48 hour(s))  CBC     Status: Abnormal   Collection Time   08/30/11  1:18 AM      Component Value Range Comment   WBC 13.4 (*) 4.0 - 10.5 (K/uL)    RBC 4.12  3.87 - 5.11 (MIL/uL)    Hemoglobin 13.2  12.0 - 15.0 (g/dL)    HCT 45.4  09.8 - 11.9 (%)    MCV 90.0  78.0 - 100.0 (fL)    MCH 32.0  26.0 - 34.0 (pg)    MCHC 35.6  30.0 - 36.0 (g/dL)    RDW 14.7  82.9 - 56.2 (%)    Platelets 243  150 - 400 (K/uL)   DIFFERENTIAL     Status: Abnormal   Collection Time   08/30/11  1:18 AM      Component Value Range Comment   Neutrophils Relative 65  43 - 77 (%)    Neutro Abs 8.6 (*) 1.7 - 7.7 (K/uL)    Lymphocytes Relative 30  12 - 46 (%)    Lymphs Abs 4.0  0.7 - 4.0 (K/uL)    Monocytes Relative 5  3 - 12 (%)    Monocytes Absolute 0.7  0.1 - 1.0 (K/uL)    Eosinophils Relative 1  0 - 5  (%)    Eosinophils Absolute 0.1  0.0 - 0.7 (K/uL)    Basophils Relative 0  0 - 1 (%)    Basophils Absolute 0.0  0.0 - 0.1 (K/uL)   COMPREHENSIVE METABOLIC PANEL     Status: Abnormal   Collection Time   08/30/11  1:18 AM      Component Value Range Comment   Sodium 140  135 - 145 (mEq/L)    Potassium 3.4 (*)  3.5 - 5.1 (mEq/L)    Chloride 106  96 - 112 (mEq/L)    CO2 20  19 - 32 (mEq/L)    Glucose, Bld 105 (*) 70 - 99 (mg/dL)    BUN 13  6 - 23 (mg/dL)    Creatinine, Ser 9.14  0.50 - 1.10 (mg/dL)    Calcium 8.9  8.4 - 10.5 (mg/dL)    Total Protein 7.2  6.0 - 8.3 (g/dL)    Albumin 4.3  3.5 - 5.2 (g/dL)    AST 782 (*) 0 - 37 (U/L)    ALT 97 (*) 0 - 35 (U/L)    Alkaline Phosphatase 63  39 - 117 (U/L)    Total Bilirubin 0.2 (*) 0.3 - 1.2 (mg/dL)    GFR calc non Af Amer 73 (*) >90 (mL/min)    GFR calc Af Amer 84 (*) >90 (mL/min)   LIPASE, BLOOD     Status: Abnormal   Collection Time   08/30/11  1:18 AM      Component Value Range Comment   Lipase 443 (*) 11 - 59 (U/L)   ETHANOL     Status: Abnormal   Collection Time   08/30/11  1:18 AM      Component Value Range Comment   Alcohol, Ethyl (B) 217 (*) 0 - 11 (mg/dL)   URINALYSIS, ROUTINE W REFLEX MICROSCOPIC     Status: Abnormal   Collection Time   08/30/11  3:11 AM      Component Value Range Comment   Color, Urine STRAW (*) YELLOW     APPearance CLEAR  CLEAR     Specific Gravity, Urine 1.006  1.005 - 1.030     pH 5.5  5.0 - 8.0     Glucose, UA NEGATIVE  NEGATIVE (mg/dL)    Hgb urine dipstick NEGATIVE  NEGATIVE     Bilirubin Urine NEGATIVE  NEGATIVE     Ketones, ur NEGATIVE  NEGATIVE (mg/dL)    Protein, ur NEGATIVE  NEGATIVE (mg/dL)    Urobilinogen, UA 0.2  0.0 - 1.0 (mg/dL)    Nitrite NEGATIVE  NEGATIVE     Leukocytes, UA NEGATIVE  NEGATIVE  MICROSCOPIC NOT DONE ON URINES WITH NEGATIVE PROTEIN, BLOOD, LEUKOCYTES, NITRITE, OR GLUCOSE <1000 mg/dL.   No results found.  Impression Present on Admission:   .Pancreatitis .Leukocytosis   41yoF with unclear h/o ulcerative colitis, IBS, hypothyroidism presents with pancreatitis, minimal  hepatitis  1. Pancreatitis: Pt has EtOH level 217 but denies any chronic, heavy drinking; this may be the  etiology but her drinking doesn't seem heavy enough to me to fully explain it, so I think RUQ u/s  is reasonable. Her ALT is > AST but not fully in 2:1. She could be minimizing, but seems pretty  forthright about her habits.   - Keep NPO, maintenance IVF's, pain control, RUQ u/s. Depending on results and progress, consider  GI consult - Of note, if pt's course gets more complicated, consider calling her outpt gastroenterologist who she is known to  2. Hypothyroidism: continue home levothyroxine  3. Holding other non essential home meds for now   Regular bed, [facility] team 8 Presumed full code   Shakima Nisley 08/30/2011, 5:16 AM

## 2011-08-30 NOTE — ED Notes (Signed)
Patient with abdominal pain that started with mild pain yesterday, out a bachelorette party this evening.  Patient has been drinking tonight, now with severe abdominal pain, no nausea or vomiting. Patient with history of abdominal issues.

## 2011-08-30 NOTE — Progress Notes (Signed)
[facility] Inpatient Progress Note  08/30/2011  Subjective: I was surprised to see patient eating chicken nuggets and fries with her husband when I entered the room.  I explained to the patient that she really needed to remain NPO while we are trying to treat her acute pancreatitis.  She said that she was nauseated and her pain had gotten worse.  I told her that eating would only make symptoms worse at this time.  Pt verbalized understanding.    Objective:  Vital signs in last 24 hours: Filed Vitals:   08/30/11 0059 08/30/11 0516 08/30/11 0517 08/30/11 0537  BP: 158/127 115/70  116/77  Pulse:  89  91  Temp: 98.5 F (36.9 C)  97.9 F (36.6 C) 98.5 F (36.9 C)  TempSrc: Oral  Oral Oral  Resp: 30 17  25   SpO2: 95% 98%  99%   Weight change:  No intake or output data in the 24 hours ending 08/30/11 1248  Review of Systems Abdominal pain and nausea, dry mouth, otherwise all reviewed and negative  Physical Exam Gen: pt sitting up in bed eating, no distress, no emesis HEENT: Pupils round and reactive, EOMI, sclera clear without icterus, conjunctival injection.  Mouth - dry mucous membranes Lungs: CTAB no w/c/r, good air movement  Heart: Regular, not tachycardic no m/g, overall normal  Abd: Soft, not peritoneal or rigid, not obese, with TTP epigastrically and low midline, but not TTP in the other quadrants. BS present  Extrem: Warm, perfusing well, good bulk and tone, no BLE edema, normal exam  Neuro: Alert, attentive, conversant, CN 2-12, moving around the ED bed without difficulty, grossly non-focal   Lab Results: Results for orders placed during the hospital encounter of 08/30/11 (from the past 48 hour(s))  CBC     Status: Abnormal   Collection Time   08/30/11  1:18 AM      Component Value Range Comment   WBC 13.4 (*) 4.0 - 10.5 (K/uL)    RBC 4.12  3.87 - 5.11 (MIL/uL)    Hemoglobin 13.2  12.0 - 15.0 (g/dL)    HCT 91.4  78.2 - 95.6 (%)    MCV 90.0  78.0 - 100.0 (fL)      MCH 32.0  26.0 - 34.0 (pg)    MCHC 35.6  30.0 - 36.0 (g/dL)    RDW 21.3  08.6 - 57.8 (%)    Platelets 243  150 - 400 (K/uL)   DIFFERENTIAL     Status: Abnormal   Collection Time   08/30/11  1:18 AM      Component Value Range Comment   Neutrophils Relative 65  43 - 77 (%)    Neutro Abs 8.6 (*) 1.7 - 7.7 (K/uL)    Lymphocytes Relative 30  12 - 46 (%)    Lymphs Abs 4.0  0.7 - 4.0 (K/uL)    Monocytes Relative 5  3 - 12 (%)    Monocytes Absolute 0.7  0.1 - 1.0 (K/uL)    Eosinophils Relative 1  0 - 5 (%)    Eosinophils Absolute 0.1  0.0 - 0.7 (K/uL)    Basophils Relative 0  0 - 1 (%)    Basophils Absolute 0.0  0.0 - 0.1 (K/uL)   COMPREHENSIVE METABOLIC PANEL     Status: Abnormal   Collection Time   08/30/11  1:18 AM      Component Value Range Comment   Sodium 140  135 - 145 (mEq/L)    Potassium  3.4 (*) 3.5 - 5.1 (mEq/L)    Chloride 106  96 - 112 (mEq/L)    CO2 20  19 - 32 (mEq/L)    Glucose, Bld 105 (*) 70 - 99 (mg/dL)    BUN 13  6 - 23 (mg/dL)    Creatinine, Ser 4.09  0.50 - 1.10 (mg/dL)    Calcium 8.9  8.4 - 10.5 (mg/dL)    Total Protein 7.2  6.0 - 8.3 (g/dL)    Albumin 4.3  3.5 - 5.2 (g/dL)    AST 811 (*) 0 - 37 (U/L)    ALT 97 (*) 0 - 35 (U/L)    Alkaline Phosphatase 63  39 - 117 (U/L)    Total Bilirubin 0.2 (*) 0.3 - 1.2 (mg/dL)    GFR calc non Af Amer 73 (*) >90 (mL/min)    GFR calc Af Amer 84 (*) >90 (mL/min)   LIPASE, BLOOD     Status: Abnormal   Collection Time   08/30/11  1:18 AM      Component Value Range Comment   Lipase 443 (*) 11 - 59 (U/L)   ETHANOL     Status: Abnormal   Collection Time   08/30/11  1:18 AM      Component Value Range Comment   Alcohol, Ethyl (B) 217 (*) 0 - 11 (mg/dL)   URINALYSIS, ROUTINE W REFLEX MICROSCOPIC     Status: Abnormal   Collection Time   08/30/11  3:11 AM      Component Value Range Comment   Color, Urine STRAW (*) YELLOW     APPearance CLEAR  CLEAR     Specific Gravity, Urine 1.006  1.005 - 1.030     pH 5.5  5.0 - 8.0      Glucose, UA NEGATIVE  NEGATIVE (mg/dL)    Hgb urine dipstick NEGATIVE  NEGATIVE     Bilirubin Urine NEGATIVE  NEGATIVE     Ketones, ur NEGATIVE  NEGATIVE (mg/dL)    Protein, ur NEGATIVE  NEGATIVE (mg/dL)    Urobilinogen, UA 0.2  0.0 - 1.0 (mg/dL)    Nitrite NEGATIVE  NEGATIVE     Leukocytes, UA NEGATIVE  NEGATIVE  MICROSCOPIC NOT DONE ON URINES WITH NEGATIVE PROTEIN, BLOOD, LEUKOCYTES, NITRITE, OR GLUCOSE <1000 mg/dL.    Micro Results: No results found for this or any previous visit (from the past 240 hour(s)).  Studies/Results: No results found.  Medications:  Scheduled Meds:   . sodium chloride   Intravenous STAT  . gi cocktail  30 mL Oral Once  . heparin  5,000 Units Subcutaneous Q8H  . HYDROmorphone      .  HYDROmorphone (DILAUDID) injection  1 mg Intravenous Once  .  HYDROmorphone (DILAUDID) injection  2 mg Intravenous Once  . levothyroxine  100 mcg Oral QAC breakfast   Continuous Infusions:   . sodium chloride 1,000 mL (08/30/11 0615)   PRN Meds:.acetaminophen, acetaminophen, HYDROmorphone (DILAUDID) injection, HYDROmorphone, ondansetron (ZOFRAN) IV, ondansetron  Assessment/Plan: 1.  Acute Pancreatitis - counseled pt as above to remain NPO except ice chips.  Continue current therapy.  Control  Pain and nausea with IV meds as ordered.  Follow lipase levels 2.  Awaiting results of [place] study  (pt says she does not have a gallbladder)  3. Hypothyroidism - stable 4. IBS - currently stable   LOS: 0 days   Harmonii Karle 08/30/2011, 12:48 PM  Cleora Fleet, MD, CDE, FAAFP [facility] [place], [place]  [phone]

## 2011-08-31 LAB — COMPREHENSIVE METABOLIC PANEL
Alkaline Phosphatase: 68 U/L (ref 39–117)
BUN: 9 mg/dL (ref 6–23)
Calcium: 8.4 mg/dL (ref 8.4–10.5)
GFR calc Af Amer: 83 mL/min — ABNORMAL LOW (ref >90)
Glucose, Bld: 78 mg/dL (ref 70–99)
Total Protein: 5.9 g/dL — ABNORMAL LOW (ref 6.0–8.3)

## 2011-08-31 LAB — LIPID PANEL
Cholesterol: 164 mg/dL (ref 0–200)
Triglycerides: 143 mg/dL (ref <150)
VLDL: 29 mg/dL (ref 0–40)

## 2011-08-31 LAB — CBC
HCT: 33.8 % — ABNORMAL LOW (ref 36.0–46.0)
Hemoglobin: 11.5 g/dL — ABNORMAL LOW (ref 12.0–15.0)
RDW: 13.1 % (ref 11.5–15.5)
WBC: 5.9 10*3/uL (ref 4.0–10.5)

## 2011-08-31 LAB — LIPASE, BLOOD: Lipase: 32 U/L (ref 11–59)

## 2011-08-31 NOTE — Progress Notes (Signed)
Utilization review complete 

## 2011-08-31 NOTE — Progress Notes (Signed)
[facility] Inpatient Progress Note  08/31/2011  Subjective: Pt says she is really hungry.  She wants to eat.  She says that she never has been told she has a fatty liver but was told she had elevated liver enzymes in the past.  She says no nausea and pain in abdomen is getting better.  Pt says that she is not a binge drinker or chronic alcohol user. She was told she had really high cholesterol in the past.   Objective:  Vital signs in last 24 hours: Filed Vitals:   08/30/11 0537 08/30/11 1300 08/30/11 2233 08/31/11 0655  BP: 116/77 120/85 107/59 106/54  Pulse: 91 100 71 70  Temp: 98.5 F (36.9 C) 98.6 F (37 C) 98.3 F (36.8 C) 97.6 F (36.4 C)  TempSrc: Oral Oral    Resp: 25 20 18 18   SpO2: 99% 100% 99% 97%   Weight change:  No intake or output data in the 24 hours ending 08/31/11 1410 Lab Results  Component Value Date   HGBA1C  Value: 5.5 (NOTE)   The ADA recommends the following therapeutic goals for glycemic   control related to Hgb A1C measurement:   Goal of Therapy:   < 7.0% Hgb A1C   Action Suggested:  > 8.0% Hgb A1C   Ref:  Diabetes Care, 22, Suppl. 1, 1999 07/12/2007   Lab Results  Component Value Date   LDLCALC  Value: 107        Total Cholesterol/HDL:CHD Risk Coronary Heart Disease Risk Table                     Men   Women  1/2 Average Risk   3.4   3.3* 07/12/2007   CREATININE 0.98 08/31/2011    Review of Systems As above, otherwise all reviewed and reported negative  Physical Exam General - awake, alert, NAD Lungs - BBS CTA  CV - normal s1, s2 sounds Abd - soft, normal BS, nondistended, very minimal epigastric TTP, no guarding TTP Ext - no clubbing or cyanosis Neuro - no focal deficits  Lab Results: Results for orders placed during the hospital encounter of 08/30/11 (from the past 24 hour(s))  COMPREHENSIVE METABOLIC PANEL     Status: Abnormal   Collection Time   08/31/11  5:03 AM      Component Value Range   Sodium 141  135 - 145 (mEq/L)   Potassium 4.3  3.5 - 5.1 (mEq/L)   Chloride 110  96 - 112 (mEq/L)   CO2 23  19 - 32 (mEq/L)   Glucose, Bld 78  70 - 99 (mg/dL)   BUN 9  6 - 23 (mg/dL)   Creatinine, Ser 1.47  0.50 - 1.10 (mg/dL)   Calcium 8.4  8.4 - 82.9 (mg/dL)   Total Protein 5.9 (*) 6.0 - 8.3 (g/dL)   Albumin 3.5  3.5 - 5.2 (g/dL)   AST 562 (*) 0 - 37 (U/L)   ALT 148 (*) 0 - 35 (U/L)   Alkaline Phosphatase 68  39 - 117 (U/L)   Total Bilirubin 0.3  0.3 - 1.2 (mg/dL)   GFR calc non Af Amer 72 (*) >90 (mL/min)   GFR calc Af Amer 83 (*) >90 (mL/min)  CBC     Status: Abnormal   Collection Time   08/31/11  5:03 AM      Component Value Range   WBC 5.9  4.0 - 10.5 (K/uL)   RBC 3.58 (*) 3.87 - 5.11 (MIL/uL)  Hemoglobin 11.5 (*) 12.0 - 15.0 (g/dL)   HCT 16.1 (*) 09.6 - 46.0 (%)   MCV 94.4  78.0 - 100.0 (fL)   MCH 32.1  26.0 - 34.0 (pg)   MCHC 34.0  30.0 - 36.0 (g/dL)   RDW 04.5  40.9 - 81.1 (%)   Platelets 149 (*) 150 - 400 (K/uL)  LIPASE, BLOOD     Status: Normal   Collection Time   08/31/11  5:03 AM      Component Value Range   Lipase 32  11 - 59 (U/L)    Micro Results: No results found for this or any previous visit (from the past 240 hour(s)).  Medications:  Scheduled Meds:   . sodium chloride   Intravenous STAT  . heparin  5,000 Units Subcutaneous Q8H  . levothyroxine  100 mcg Oral QAC breakfast   Continuous Infusions:   . sodium chloride 1,000 mL (08/30/11 2237)   PRN Meds:.acetaminophen, acetaminophen, HYDROmorphone (DILAUDID) injection, HYDROmorphone, ondansetron (ZOFRAN) IV, ondansetron  Assessment/Plan: 1.  Pancreatitis  - acute - improving, lipase to normal range now, advance diet, if patient tolerates regular diet, will discharge home to follow up with Dr. Obie Dredge in [place], [place]  Check fasting lipid panel now.  Reviewed [place] results with patient.    Will reassess later to see if pt tolerated diet and is comfortable with going home.   2.Hypothyroidism - stable  3.IBS - currently stable   LOS: 1  day   Danira Nylander 08/31/2011, 2:10 PM   Cleora Fleet, MD, CDE, FAAFP [facility] [place], [place]  [phone]

## 2011-09-01 DIAGNOSIS — K760 Fatty (change of) liver, not elsewhere classified: Secondary | ICD-10-CM | POA: Diagnosis present

## 2011-09-01 DIAGNOSIS — R112 Nausea with vomiting, unspecified: Secondary | ICD-10-CM | POA: Diagnosis present

## 2011-09-01 DIAGNOSIS — F419 Anxiety disorder, unspecified: Secondary | ICD-10-CM | POA: Diagnosis present

## 2011-09-01 DIAGNOSIS — R109 Unspecified abdominal pain: Secondary | ICD-10-CM | POA: Diagnosis present

## 2011-09-01 LAB — BASIC METABOLIC PANEL
BUN: 8 mg/dL (ref 6–23)
Chloride: 108 mEq/L (ref 96–112)
GFR calc Af Amer: 89 mL/min — ABNORMAL LOW (ref >90)
Potassium: 3.8 mEq/L (ref 3.5–5.1)

## 2011-09-01 MED ORDER — HYDROCODONE-ACETAMINOPHEN 5-500 MG PO TABS: 1.0000 | ORAL_TABLET | Freq: Four times a day (QID) | ORAL | Status: AC | PRN

## 2011-09-01 MED ORDER — ONDANSETRON HCL 4 MG/2ML IJ SOLN
4.0000 mg | Freq: Once | INTRAMUSCULAR | Status: AC
  Administered 2011-09-01: 4 mg via INTRAVENOUS

## 2011-09-01 MED ORDER — PROMETHAZINE HCL 25 MG/ML IJ SOLN
25.0000 mg | Freq: Once | INTRAMUSCULAR | Status: AC
  Administered 2011-09-01: 25 mg via INTRAVENOUS
  Filled 2011-09-01: qty 1

## 2011-09-01 MED ORDER — PROMETHAZINE HCL 12.5 MG PO TABS: 12.5000 mg | ORAL_TABLET | Freq: Four times a day (QID) | ORAL | Status: DC | PRN

## 2011-09-01 MED ORDER — HYDROMORPHONE HCL PF 1 MG/ML IJ SOLN
0.5000 mg | INTRAMUSCULAR | Status: DC | PRN
  Administered 2011-09-01: 0.5 mg via INTRAVENOUS
  Filled 2011-09-01: qty 1

## 2011-09-01 NOTE — Discharge Summary (Signed)
HOSPITAL DISCHARGE SUMMARY   @n   Zineb Glade, 41 y.o., DOB 1970-12-14, MRN [id number]  Admission date: 08/30/2011 Discharge Date: 09/01/2011  Primary MD [name], MD, MD  Admitting Physician Devonne Doughty, MD  Admission Diagnosis  Acute pancreatitis [577.0] Abdominal Pain  Discharge Diagnoses:   Active Hospital Problems  Diagnoses Date Noted   . Pancreatitis 08/30/2011   . Fatty liver 09/01/2011   . Anxiety disorder 09/01/2011   . Abdominal pain 09/01/2011   . Leukocytosis 08/30/2011     Resolved Hospital Problems  Diagnoses Date Noted Date Resolved  . Nausea vomiting and diarrhea 09/01/2011 09/01/2011    Past Medical History  Diagnosis Date  . Anxiety   . IBS (irritable bowel syndrome)   . Thyroid disease   . Diverticula of intestine   . Ulcerative colitis     Past Surgical History  Procedure Date  . Abdominal hysterectomy   . Appendectomy   . Cholecystectomy   . Knee arthroscopy     Consults  None  Significant Tests:  See full reports for all details    Hospital Course See H&P, Labs, Consult and Test reports for all details. In brief, this patient was admitted for acute nausea and vomiting and abdominal pain that was severe and acute in onset.  The patient was found to have acute pancreatitis. The patient had an elevated lipase of greater than 400.  Patient had reported that she had been drinking a small amount of alcohol and is not a chronic drinker of alcohol.  The patient has had a cholecystectomy prior to this admission.  The patient reports that she has a history of elevated cholesterol but no history of super high triglycerides.  We did check a lipid panel during this hospitalization and the patient's triglycerides were 143 and other tests negative for any significant cholesterol problems.  She did have anal ultrasound of the abdomen and right upper quadrant there was significant for fatty liver steatohepatitis but no other abnormalities seen.  She has had  a cholecystectomy no stones seen in any ducts.  The patient was given IV pain medication, IV fluids and nausea medications.  She was n.p.o. by order however on day 1 of hospital visit I saw that she had been eating chicken nuggets with her husband.  I asked her to remain n.p.o. which she did for the next 24 hours and her lipase level improved.  It had improved to 37 prior to discharge.  Patient's abdominal pain improved considerably to almost complete resolution.  She has a soft abdomen.  She did have some nausea on the day of discharge and received Phenergan and Zofran.  She reports that she believes she tried to eat too much at one time and became ill.  Her diarrhea improved considerably.  She does have a history of irritable bowel syndrome.  I spoke with the patient on the day of discharge and her nausea had resolved and her pain was controlled her belly was soft and the patient decided to go home today to have followup.  I did give her the option of staying an additional day because she had some nausea today but she felt like she was okay to go home.  Her husband was present and is willing to drive her home.  I would like her to follow up with Dr. Obie Dredge her primary care physician within the week and to have her enzymes retested with a metabolic panel and lipase.  I've asked the patient to avoid alcoholic  beverages as this is the only thing that we have found that could have contributed to her acute pancreatitis episode.  Active Hospital Problems  Diagnoses Date Noted   . Pancreatitis 08/30/2011   . Fatty liver 09/01/2011   . Anxiety disorder 09/01/2011   . Abdominal pain 09/01/2011   . Leukocytosis 08/30/2011     Resolved Hospital Problems  Diagnoses Date Noted Date Resolved  . Nausea vomiting and diarrhea 09/01/2011 09/01/2011     Today's Assessment:   Subjective:   Hailynn Smith the patient is awake and alert and sitting up in bed with her husband.  She has been eating throughout the day.   No further nausea and no vomiting.  The patient reports that her loose stools have resolved at this time.  Patient's having no trouble drinking liquids at this time.  Patient is requesting to go home today. Objective:   Blood pressure 132/80, pulse 58, temperature 97.7 F (36.5 C), temperature source Oral, resp. rate 18, SpO2 98.00%.  Intake/Output Summary (Last 24 hours) at 09/01/11 1536 Last data filed at 09/01/11 1300  Gross per 24 hour  Intake 2704.5 ml  Output      0 ml  Net 2704.5 ml    Exam Gen. exam-patient lying in bed she is awake alert in no distress cooperative and pleasant HEENT normocephalic atraumatic mucous membranes moist Neck-supple soft thyroid soft no nodules or masses palpated no lymphadenopathy Lungs bilateral breath sounds clear auscultation no crackles wheezes or rhonchi CV-normal S1-S2 sounds without murmurs rubs or gallops Abdomen-soft nondistended nontender no masses no hepatosplenomegaly guarding or rebound tenderness noted Extremities-no pretibial edema cyanosis or clubbing noted. Neuro-no focal findings    Lab Results  Component Value Date   WBC 5.9 08/31/2011   HGB 11.5* 08/31/2011   HCT 33.8* 08/31/2011   PLT 149* 08/31/2011   LYMPHOPCT 30 08/30/2011   MONOPCT 5 08/30/2011   EOSPCT 1 08/30/2011   BASOPCT 0 08/30/2011   CMP: Lab Results  Component Value Date   NA 140 09/01/2011   K 3.8 09/01/2011   CL 108 09/01/2011   CO2 26 09/01/2011   BUN 8 09/01/2011   CREATININE 0.93 09/01/2011   PROT 5.9* 08/31/2011   ALBUMIN 3.5 08/31/2011   BILITOT 0.3 08/31/2011   ALKPHOS 68 08/31/2011   AST 100* 08/31/2011   ALT 148* 08/31/2011  .  Discharge Instructions    Please avoid alcoholic beverages Please drink extra clear fluids for the next 7 days Please be sure to followup with primary care provider to have blood work rechecked within a week. Return if symptoms recur, worsen or new changes or problems develop.  DISCHARGE MEDICATION: Medication List  As of  09/01/2011  3:36 PM   STOP taking these medications         GOODY HEADACHE PO      RA SINUS TABLETS EX ST PO         TAKE these medications         ALPRAZolam 1 MG tablet   Commonly known as: XANAX   Take 1 mg by mouth 2 (two) times daily as needed. For anxiety      B-12 PO   Take 1 tablet by mouth daily.      HYDROcodone-acetaminophen 5-500 MG per tablet   Commonly known as: VICODIN   Take 1 tablet by mouth every 6 (six) hours as needed for pain.      hyoscyamine 0.125 MG SL tablet   Commonly known as:  LEVSIN SL   Place 0.125 mg under the tongue every 6 (six) hours as needed. For IBS      ibuprofen 200 MG tablet   Commonly known as: ADVIL,MOTRIN   Take 200-800 mg by mouth 2 (two) times daily as needed. For pain      levothyroxine 100 MCG tablet   Commonly known as: SYNTHROID, LEVOTHROID   Take 100 mcg by mouth every morning.      omeprazole 20 MG capsule   Commonly known as: PRILOSEC   Take 20 mg by mouth daily as needed. For acid reflux      polyethylene glycol powder powder   Commonly known as: GLYCOLAX/MIRALAX   Take 17 g by mouth as needed. For constipation.      promethazine 12.5 MG tablet   Commonly known as: PHENERGAN   Take 1 tablet (12.5 mg total) by mouth every 6 (six) hours as needed for nausea.      traZODone 100 MG tablet   Commonly known as: DESYREL   Take 100-200 mg by mouth at bedtime.            Disposition and Follow-up:  Discharge Orders    Future Orders Please Complete By Expires   Diet - low sodium heart healthy      Increase activity slowly      Discharge instructions      Comments:   Slowly advance diet until your are no longer having any nausea Drink plenty of extra clear fluids Avoid Alcohol Be sure to follow up with Dr. Loney Hering by next week to have your labs redrawn and tested.   Return for worsening symptoms or new problems.     Driving Restrictions      Comments:   No driving if taking or under influence of  any pain or  nausea medications   Call MD for:  persistant nausea and vomiting      Call MD for:  severe uncontrolled pain      Call MD for:  persistant dizziness or light-headedness      Discontinue IV        Follow-up Information    Follow up with [name], MD in 1 week.          The risks, benefits, and possible side effects of all treatments and tests were explained to the patient.  The patient verbalized understanding.  The importance of close follow up with the primary care medical provider was explained clearly to the patient.  The patient verbalized understanding.  The patient was given instructions to return if symptoms recur, worsen or new changes develop.  The patient verbalized understanding.   Cleora Fleet, MD, CDE, FAAFP [facility] [place], [place]   Total Time spent reviewing critical document, reviewing this patient's comprehensive hospitalization, arranging follow up and coordination of care, reviewing data and todays exam greater than 35 minutes.   SignedStandley Dakins 09/01/2011 3:36 PM

## 2011-09-07 ENCOUNTER — Emergency Department (HOSPITAL_COMMUNITY)
Admission: EM | Admit: 2011-09-07 | Discharge: 2011-09-07 | Discharge status: Against Medical Advice | Payer: BC Managed Care – PPO

## 2011-09-07 NOTE — ED Notes (Signed)
Called no answer

## 2011-09-07 NOTE — ED Notes (Signed)
Called for triage, no answer

## 2012-02-27 ENCOUNTER — Emergency Department (HOSPITAL_COMMUNITY)
Admission: EM | Admit: 2012-02-27 | Discharge: 2012-02-27 | Disposition: A | Discharge status: Home / Self Care | Payer: BC Managed Care – PPO | Attending: Emergency Medicine | Admitting: Emergency Medicine

## 2012-02-27 ENCOUNTER — Emergency Department (HOSPITAL_COMMUNITY): Payer: BC Managed Care – PPO

## 2012-02-27 ENCOUNTER — Encounter (HOSPITAL_COMMUNITY): Payer: Self-pay | Admitting: Emergency Medicine

## 2012-02-27 DIAGNOSIS — F17200 Nicotine dependence, unspecified, uncomplicated: Secondary | ICD-10-CM | POA: Insufficient documentation

## 2012-02-27 DIAGNOSIS — Z79899 Other long term (current) drug therapy: Secondary | ICD-10-CM | POA: Insufficient documentation

## 2012-02-27 DIAGNOSIS — Z8719 Personal history of other diseases of the digestive system: Secondary | ICD-10-CM | POA: Insufficient documentation

## 2012-02-27 DIAGNOSIS — R112 Nausea with vomiting, unspecified: Secondary | ICD-10-CM | POA: Insufficient documentation

## 2012-02-27 DIAGNOSIS — F411 Generalized anxiety disorder: Secondary | ICD-10-CM | POA: Insufficient documentation

## 2012-02-27 DIAGNOSIS — E079 Disorder of thyroid, unspecified: Secondary | ICD-10-CM | POA: Insufficient documentation

## 2012-02-27 DIAGNOSIS — R109 Unspecified abdominal pain: Secondary | ICD-10-CM

## 2012-02-27 DIAGNOSIS — R1013 Epigastric pain: Secondary | ICD-10-CM | POA: Insufficient documentation

## 2012-02-27 LAB — URINALYSIS, ROUTINE W REFLEX MICROSCOPIC
Bilirubin Urine: NEGATIVE
Ketones, ur: NEGATIVE mg/dL
Nitrite: NEGATIVE
Urobilinogen, UA: 0.2 mg/dL (ref 0.0–1.0)

## 2012-02-27 LAB — CBC WITH DIFFERENTIAL/PLATELET
Hemoglobin: 13.9 g/dL (ref 12.0–15.0)
Lymphs Abs: 4.1 10*3/uL — ABNORMAL HIGH (ref 0.7–4.0)
MCH: 32.5 pg (ref 26.0–34.0)
Monocytes Relative: 5 % (ref 3–12)
Neutro Abs: 5.4 10*3/uL (ref 1.7–7.7)
Neutrophils Relative %: 52 % (ref 43–77)
RBC: 4.28 MIL/uL (ref 3.87–5.11)

## 2012-02-27 LAB — COMPREHENSIVE METABOLIC PANEL
Alkaline Phosphatase: 70 U/L (ref 39–117)
BUN: 11 mg/dL (ref 6–23)
CO2: 24 mEq/L (ref 19–32)
Chloride: 107 mEq/L (ref 96–112)
GFR calc Af Amer: >90 mL/min (ref >90)
Glucose, Bld: 92 mg/dL (ref 70–99)
Potassium: 3.7 mEq/L (ref 3.5–5.1)
Total Bilirubin: 0.2 mg/dL — ABNORMAL LOW (ref 0.3–1.2)

## 2012-02-27 LAB — LIPASE, BLOOD: Lipase: 29 U/L (ref 11–59)

## 2012-02-27 MED ORDER — HYDROMORPHONE HCL PF 1 MG/ML IJ SOLN
1.0000 mg | Freq: Once | INTRAMUSCULAR | Status: AC
  Administered 2012-02-27: 1 mg via INTRAVENOUS
  Filled 2012-02-27: qty 1

## 2012-02-27 MED ORDER — SUCRALFATE 1 G PO TABS: 1.0000 g | ORAL_TABLET | Freq: Four times a day (QID) | ORAL | Status: DC

## 2012-02-27 MED ORDER — OXYCODONE-ACETAMINOPHEN 5-325 MG PO TABS: 1.0000 | ORAL_TABLET | Freq: Four times a day (QID) | ORAL | Status: AC | PRN

## 2012-02-27 MED ORDER — KETOROLAC TROMETHAMINE 30 MG/ML IJ SOLN
30.0000 mg | Freq: Once | INTRAMUSCULAR | Status: AC
  Administered 2012-02-27: 30 mg via INTRAVENOUS

## 2012-02-27 MED ORDER — PANTOPRAZOLE SODIUM 40 MG IV SOLR
40.0000 mg | Freq: Once | INTRAVENOUS | Status: AC
  Administered 2012-02-27: 40 mg via INTRAVENOUS
  Filled 2012-02-27: qty 40

## 2012-02-27 MED ORDER — LORAZEPAM 2 MG/ML IJ SOLN
INTRAMUSCULAR | Status: AC
  Administered 2012-02-27: 1 mg via INTRAVENOUS
  Filled 2012-02-27: qty 1

## 2012-02-27 MED ORDER — ONDANSETRON HCL 4 MG/2ML IJ SOLN
4.0000 mg | Freq: Once | INTRAMUSCULAR | Status: AC
Start: 2012-02-27 — End: 2012-02-27
  Administered 2012-02-27: 4 mg via INTRAVENOUS
  Filled 2012-02-27: qty 2

## 2012-02-27 MED ORDER — HYDROMORPHONE HCL PF 2 MG/ML IJ SOLN
INTRAMUSCULAR | Status: AC
  Administered 2012-02-27: 2 mg via INTRAVENOUS
  Filled 2012-02-27: qty 1

## 2012-02-27 MED ORDER — HYDROMORPHONE HCL PF 2 MG/ML IJ SOLN
2.0000 mg | Freq: Once | INTRAMUSCULAR | Status: AC
  Administered 2012-02-27: 2 mg via INTRAVENOUS

## 2012-02-27 MED ORDER — PROMETHAZINE HCL 25 MG PO TABS: 25.0000 mg | ORAL_TABLET | Freq: Four times a day (QID) | ORAL | Status: DC | PRN

## 2012-02-27 MED ORDER — LORAZEPAM 2 MG/ML IJ SOLN
1.0000 mg | Freq: Once | INTRAMUSCULAR | Status: AC
  Administered 2012-02-27: 1 mg via INTRAVENOUS

## 2012-02-27 NOTE — ED Notes (Signed)
Pt alert & oriented x4, stable gait. Patient given discharge instructions, paperwork & prescription(s). Patient  instructed to stop at the registration desk to finish any additional paperwork. Patient verbalized understanding. Pt left department w/ no further questions. 

## 2012-02-27 NOTE — ED Notes (Signed)
Pt c/o abd pain since Wed and now left flank pain started today.

## 2012-02-27 NOTE — ED Provider Notes (Signed)
History  This chart was scribed for Alyssa Lennert, MD by Charolett Bumpers . The patient was seen in room APA11/APA11. Patient's care was started at 1755.    CSN: [id number]  Arrival date & time 02/27/12  1725   First MD Initiated Contact with Patient 02/27/12 1755      Chief Complaint  Patient presents with  . Abdominal Pain  . Flank Pain    (Consider location/radiation/quality/duration/timing/severity/associated sxs/prior treatment) HPI Comments: [name] is a 41 y.o. female who presents to the Emergency Department complaining of constant, moderate, worsening epigastric abdominal pain that started 3 days ago. Pt woke up this morning and pain was radiating into her left flank. Pain is associated with nausea, emesis (once) and decreased appetite. Pt denies fever. Pt denies h/o of similar symptoms. Pt has had a appendectomy, hysterectomy, and cholecystectomy.   Patient is a 41 y.o. female presenting with abdominal pain. The history is provided by the patient.  Abdominal Pain The primary symptoms of the illness include abdominal pain, nausea and vomiting. The primary symptoms of the illness do not include fever, fatigue or diarrhea. The current episode started more than 2 days ago. The problem has been gradually worsening.  The abdominal pain is located in the epigastric region. The abdominal pain radiates to the left flank. The abdominal pain is relieved by nothing.  Symptoms associated with the illness do not include chills, hematuria, frequency or back pain.    Past Medical History  Diagnosis Date  . Anxiety   . IBS (irritable bowel syndrome)   . Thyroid disease   . Diverticula of intestine   . Ulcerative colitis     Past Surgical History  Procedure Date  . Abdominal hysterectomy   . Appendectomy   . Cholecystectomy   . Knee arthroscopy     Family History  Problem Relation Age of Onset  . Diabetes Mother   . COPD Mother   . Heart disease Mother   .  Breast cancer Paternal Grandmother   . Lung cancer Paternal Grandfather     History  Substance Use Topics  . Smoking status: Current Everyday Smoker -- 1.0 packs/day for 20 years    Types: Cigarettes  . Smokeless tobacco: Not on file  . Alcohol Use: No     socially    OB History    Grav Para Term Preterm Abortions TAB SAB Ect Mult Living                  Review of Systems  Constitutional: Negative for fever, chills and fatigue.  HENT: Negative for congestion, sinus pressure and ear discharge.   Eyes: Negative for discharge.  Respiratory: Negative for cough.   Cardiovascular: Negative for chest pain.  Gastrointestinal: Positive for nausea, vomiting and abdominal pain. Negative for diarrhea.  Genitourinary: Positive for flank pain. Negative for frequency and hematuria.  Musculoskeletal: Negative for back pain.  Skin: Negative for rash.  Neurological: Negative for seizures and headaches.  Hematological: Negative.   Psychiatric/Behavioral: Negative for hallucinations.  All other systems reviewed and are negative.    Allergies  Morphine and related  Home Medications   Current Outpatient Rx  Name Route Sig Dispense Refill  . ALPRAZOLAM 1 MG PO TABS Oral Take 1 mg by mouth 2 (two) times daily as needed. For anxiety     . B-12 PO Oral Take 1 tablet by mouth daily.      Marland Kitchen HYOSCYAMINE SULFATE 0.125 MG SL SUBL Sublingual Place  0.125 mg under the tongue every 6 (six) hours as needed. For IBS     . IBUPROFEN 200 MG PO TABS Oral Take 200-800 mg by mouth 2 (two) times daily as needed. For pain    . LEVOTHYROXINE SODIUM 100 MCG PO TABS Oral Take 100 mcg by mouth every morning.      Marland Kitchen OMEPRAZOLE 20 MG PO CPDR Oral Take 20 mg by mouth daily as needed. For acid reflux     . POLYETHYLENE GLYCOL 3350 PO POWD Oral Take 17 g by mouth as needed. For constipation.    . TRAZODONE HCL 100 MG PO TABS Oral Take 100-200 mg by mouth at bedtime.      BP 137/105  Pulse 127  Temp 98.6 F (37 C)  (Oral)  Resp 20  Ht 5\' 6"  (1.676 m)  Wt 145 lb (65.772 kg)  BMI 23.40 kg/m2  SpO2 100%  Physical Exam  Constitutional: She is oriented to person, place, and time. She appears well-developed.  HENT:  Head: Normocephalic and atraumatic.  Eyes: Conjunctivae and EOM are normal. No scleral icterus.  Neck: Neck supple. No thyromegaly present.  Cardiovascular: Normal rate and regular rhythm.  Exam reveals no gallop and no friction rub.   No murmur heard. Pulmonary/Chest: No stridor. She has no wheezes. She has no rales. She exhibits no tenderness.  Abdominal: Soft. She exhibits no distension. There is tenderness. There is no rebound.       Moderate epigastric and LUQ tenderness. Left flank tenderness.   Musculoskeletal: Normal range of motion. She exhibits no edema.  Lymphadenopathy:    She has no cervical adenopathy.  Neurological: She is oriented to person, place, and time. Coordination normal.  Skin: No rash noted. No erythema.  Psychiatric: She has a normal mood and affect. Her behavior is normal.    ED Course  Procedures (including critical care time)  DIAGNOSTIC STUDIES: Oxygen Saturation is 100% on room air, normal by my interpretation.    COORDINATION OF CARE:  18:05-Discussed planned course of treatment with the patient including CT of abdomen, blood work and pain and nausea medication, who is agreeable at this time.   18:15-Medication Orders: Pantoprazole (Protonix) injection 40 mg-once; Ondansetron (Zofran) injection 4 mg-once; Hydromorphone (Dilaudid) injection 1 mg-once  18:30-Medication Orders: Ketorolac (Toradol) 30 mg/mL injection 30 mg-once.   Results for orders placed during the hospital encounter of 02/27/12  CBC WITH DIFFERENTIAL      Component Value Range   WBC 10.3  4.0 - 10.5 K/uL   RBC 4.28  3.87 - 5.11 MIL/uL   Hemoglobin 13.9  12.0 - 15.0 g/dL   HCT 16.1  09.6 - 04.5 %   MCV 91.1  78.0 - 100.0 fL   MCH 32.5  26.0 - 34.0 pg   MCHC 35.6  30.0 - 36.0  g/dL   RDW 40.9  81.1 - 91.4 %   Platelets 233  150 - 400 K/uL   Neutrophils Relative 52  43 - 77 %   Neutro Abs 5.4  1.7 - 7.7 K/uL   Lymphocytes Relative 40  12 - 46 %   Lymphs Abs 4.1 (*) 0.7 - 4.0 K/uL   Monocytes Relative 5  3 - 12 %   Monocytes Absolute 0.5  0.1 - 1.0 K/uL   Eosinophils Relative 2  0 - 5 %   Eosinophils Absolute 0.3  0.0 - 0.7 K/uL   Basophils Relative 1  0 - 1 %   Basophils Absolute 0.1  0.0 - 0.1 K/uL  COMPREHENSIVE METABOLIC PANEL      Component Value Range   Sodium 141  135 - 145 mEq/L   Potassium 3.7  3.5 - 5.1 mEq/L   Chloride 107  96 - 112 mEq/L   CO2 24  19 - 32 mEq/L   Glucose, Bld 92  70 - 99 mg/dL   BUN 11  6 - 23 mg/dL   Creatinine, Ser 1.61  0.50 - 1.10 mg/dL   Calcium 09.6  8.4 - 04.5 mg/dL   Total Protein 7.5  6.0 - 8.3 g/dL   Albumin 4.7  3.5 - 5.2 g/dL   AST 21  0 - 37 U/L   ALT 27  0 - 35 U/L   Alkaline Phosphatase 70  39 - 117 U/L   Total Bilirubin 0.2 (*) 0.3 - 1.2 mg/dL   GFR calc non Af Amer 78 (*) >90 mL/min   GFR calc Af Amer >90  >90 mL/min  LIPASE, BLOOD      Component Value Range   Lipase 29  11 - 59 U/L    Ct Abdomen Pelvis Wo Contrast  02/27/2012  *RADIOLOGY REPORT*  Clinical Data: Left flank pain and nausea.  Ulcerative colitis and diverticulosis.  CT ABDOMEN AND PELVIS WITHOUT CONTRAST  Technique:  Multidetector CT imaging of the abdomen and pelvis was performed following the standard protocol without intravenous contrast.  Comparison: 07/09/2010  Findings: No evidence of renal calculi or hydronephrosis.  No evidence of ureteral calculi or dilatation.  No bladder calculi identified.  Prior hysterectomy noted.  Adnexa are unremarkable in appearance.  The other abdominal parenchymal organs have a normal appearance on this noncontrast study.  Surgical clips are seen from prior cholecystectomy.  No soft tissue masses or lymphadenopathy identified.  No evidence of inflammatory process or abnormal fluid collections. No evidence of  dilated bowel loops or hernia.  IMPRESSION: Negative.  No evidence of urolithiasis, hydronephrosis, or other acute findings.   Original Report Authenticated By: Danae Orleans, M.D.      No diagnosis found.    MDM      The chart was scribed for me under my direct supervision.  I personally performed the history, physical, and medical decision making and all procedures in the evaluation of this patient.Alyssa Lennert, MD 02/27/12 2001

## 2012-02-27 NOTE — ED Notes (Signed)
Reports "it feels like I'm having back labor".  Pt is restless, c/o back pain and abd pain.

## 2012-03-01 MED FILL — Oxycodone w/ Acetaminophen Tab 5-325 MG: ORAL | Qty: 6 | Status: AC

## 2012-03-03 ENCOUNTER — Encounter (INDEPENDENT_AMBULATORY_CARE_PROVIDER_SITE_OTHER): Payer: Self-pay | Admitting: Internal Medicine

## 2012-03-03 ENCOUNTER — Ambulatory Visit (INDEPENDENT_AMBULATORY_CARE_PROVIDER_SITE_OTHER): Payer: BC Managed Care – PPO | Admitting: Internal Medicine

## 2012-03-03 VITALS — BP 118/84 | HR 84 | Temp 98.3°F | Ht 66.0 in | Wt 152.9 lb

## 2012-03-03 DIAGNOSIS — R142 Eructation: Secondary | ICD-10-CM

## 2012-03-03 DIAGNOSIS — R141 Gas pain: Secondary | ICD-10-CM

## 2012-03-03 DIAGNOSIS — R140 Abdominal distension (gaseous): Secondary | ICD-10-CM

## 2012-03-03 DIAGNOSIS — K5900 Constipation, unspecified: Secondary | ICD-10-CM

## 2012-03-03 NOTE — Progress Notes (Signed)
Subjective:     Patient ID: [name], female   DOB: 1971-06-19, 41 y.o.   MRN: [id number]  HPIChristal is a 41 yr old female presenting with c/o abdominal pain.  She tells me she is constipated. She has a BM about once a month. She has tried laxatives and enemas which have not worked.  Symptoms started a week ago.  She has abdominal pain in her epigastric region radiating into her left side of back.  She tells me the Abdominal swelling started Monday. She drank a bottle of Mag citrate. Her last BM was 2 weeks ago. Appetite is good. No weight loss. BM x 1 a day and then she may have one a week and then she may have one a month.  Stools are normal caliber. She says she had a stool last week and it was charcoal colored. She tells me her abdomen has been distended before when she becomes constipated. After having a BM, distention resolves.  She rates her abdominal pain at a 4 at this time.  CT abdomen/pelvis w/o 02/26/2012: Ct Abdomen Pelvis Wo Contrast  02/27/2012 *RADIOLOGY REPORT* Clinical Data: Left flank pain and nausea. Ulcerative colitis and diverticulosis. CT ABDOMEN AND PELVIS WITHOUT CONTRAST Technique: Multidetector CT imaging of the abdomen and pelvis was performed following the standard protocol without intravenous contrast. Comparison: 07/09/2010 Findings: No evidence of renal calculi or hydronephrosis. No evidence of ureteral calculi or dilatation. No bladder calculi identified. Prior hysterectomy noted. Adnexa are unremarkable in appearance. The other abdominal parenchymal organs have a normal appearance on this noncontrast study. Surgical clips are seen from prior cholecystectomy. No soft tissue masses or lymphadenopathy identified. No evidence of inflammatory process or abnormal fluid collections. No evidence of dilated bowel loops or hernia. IMPRESSION: Negative. No evidence of urolithiasis, hydronephrosis, or other acute findings. Original Report Authenticated By: Danae Orleans, M.D.    CBC    Component Value Date/Time   WBC 10.3 02/27/2012 1804   RBC 4.28 02/27/2012 1804   HGB 13.9 02/27/2012 1804   HCT 39.0 02/27/2012 1804   PLT 233 02/27/2012 1804   MCV 91.1 02/27/2012 1804   MCH 32.5 02/27/2012 1804   MCHC 35.6 02/27/2012 1804   RDW 12.6 02/27/2012 1804   LYMPHSABS 4.1* 02/27/2012 1804   MONOABS 0.5 02/27/2012 1804   EOSABS 0.3 02/27/2012 1804   BASOSABS 0.1 02/27/2012 1804    CMP     Component Value Date/Time   NA 141 02/27/2012 1804   K 3.7 02/27/2012 1804   CL 107 02/27/2012 1804   CO2 24 02/27/2012 1804   GLUCOSE 92 02/27/2012 1804   BUN 11 02/27/2012 1804   CREATININE 0.91 02/27/2012 1804   CALCIUM 10.1 02/27/2012 1804   PROT 7.5 02/27/2012 1804   ALBUMIN 4.7 02/27/2012 1804   AST 21 02/27/2012 1804   ALT 27 02/27/2012 1804   ALKPHOS 70 02/27/2012 1804   BILITOT 0.2* 02/27/2012 1804   GFRNONAA 78* 02/27/2012 1804   GFRAA >90 02/27/2012 1804      Review of Systems see med Current Outpatient Prescriptions  Medication Sig Dispense Refill  . ALPRAZolam (XANAX) 1 MG tablet Take 1 mg by mouth 2 (two) times daily as needed. For anxiety       . ibuprofen (ADVIL,MOTRIN) 200 MG tablet Take 200-800 mg by mouth 2 (two) times daily as needed. For pain      . levothyroxine (SYNTHROID, LEVOTHROID) 100 MCG tablet Take 100 mcg by mouth every morning.        Marland Kitchen  omeprazole (PRILOSEC) 20 MG capsule Take 20 mg by mouth daily as needed. For acid reflux       . oxyCODONE-acetaminophen (PERCOCET/ROXICET) 5-325 MG per tablet Take 1 tablet by mouth every 6 (six) hours as needed for pain.  20 tablet  0  . oxyCODONE-acetaminophen (PERCOCET/ROXICET) 5-325 MG per tablet Take 1 tablet by mouth every 6 (six) hours as needed for pain.  6 tablet  0  . PROBIOTIC CAPS Take 1 capsule by mouth daily.      . promethazine (PHENERGAN) 25 MG tablet Take 1 tablet (25 mg total) by mouth every 6 (six) hours as needed for nausea.  15 tablet  0  . Cyanocobalamin (B-12 PO) Take 1 tablet by mouth daily.        . hyoscyamine (LEVSIN SL)  0.125 MG SL tablet Place 0.125 mg under the tongue 4 (four) times daily as needed. For IBS      . promethazine (PHENERGAN) 12.5 MG tablet Take 1 tablet (12.5 mg total) by mouth every 6 (six) hours as needed for nausea.  20 tablet  0  . sucralfate (CARAFATE) 1 G tablet Take 1 tablet (1 g total) by mouth 4 (four) times daily.  40 tablet  1  . traZODone (DESYREL) 100 MG tablet Take 100-200 mg by mouth at bedtime.      ' Past Medical History  Diagnosis Date  . Anxiety   . IBS (irritable bowel syndrome)   . Thyroid disease   . Diverticula of intestine   . Ulcerative colitis    Past Surgical History  Procedure Date  . Abdominal hysterectomy   . Appendectomy   . Cholecystectomy   . Knee arthroscopy    History   Social History  . Marital Status: Married    Spouse Name: N/A    Number of Children: N/A  . Years of Education: N/A   Occupational History  . Not on file.   Social History Main Topics  . Smoking status: Current Every Day Smoker -- 1.0 packs/day for 20 years    Types: Cigarettes  . Smokeless tobacco: Not on file  . Alcohol Use: No     socially  . Drug Use: No  . Sexually Active: Not on file   Other Topics Concern  . Not on file   Social History Narrative  . No narrative on file   Family Status  Relation Status Death Age  . Mother Alive   . Father Alive     fair health  . Brother Alive     stomach issues   ASSESSMENT:  Allergies  Allergen Reactions  . Morphine And Related Itching and Rash        Objective:   Physical Exam Filed Vitals:   03/03/12 1454  BP: 118/84  Pulse: 84  Temp: 98.3 F (36.8 C)  Height: 5\' 6"  (1.676 m)  Weight: 152 lb 14.4 oz (69.355 kg)   Alert and oriented. Skin warm and dry. Oral mucosa is moist.   . Sclera anicteric, conjunctivae is pink. Thyroid not enlarged. No cervical lymphadenopathy. Lungs clear. Heart regular rate and rhythm.  Abdomen is soft and slightly distended.. Bowel sounds are positive. No hepatomegaly. No  abdominal masses felt. No tenderness.  No edema to lower extremities.        Assessment:    Abdominal pain, distention. Abdodmen is soft and her bowel sounds are presents.  Constipation.  . She rates the pain at a 4. This has occurred in the past  when she became constipated.    Plan:    Linzess samples given to patient. Fleets enema x 2 today.  Call tomorrow with progress report.  OV in 2 weeks. If pain worsens, go to the ED.

## 2012-03-03 NOTE — Patient Instructions (Addendum)
2 Fleet enemas today. Linzess one po daily samples.Marland Kitchen PR Friday. OV 2 weeks.

## 2012-03-14 ENCOUNTER — Encounter (INDEPENDENT_AMBULATORY_CARE_PROVIDER_SITE_OTHER): Payer: Self-pay | Admitting: Internal Medicine

## 2012-03-14 ENCOUNTER — Ambulatory Visit (INDEPENDENT_AMBULATORY_CARE_PROVIDER_SITE_OTHER): Payer: BC Managed Care – PPO | Admitting: Internal Medicine

## 2012-03-14 VITALS — BP 118/70 | HR 68 | Temp 98.6°F | Resp 18 | Ht 66.0 in | Wt 151.1 lb

## 2012-03-14 DIAGNOSIS — L0231 Cutaneous abscess of buttock: Secondary | ICD-10-CM

## 2012-03-14 DIAGNOSIS — L03317 Cellulitis of buttock: Secondary | ICD-10-CM

## 2012-03-14 DIAGNOSIS — R1013 Epigastric pain: Secondary | ICD-10-CM

## 2012-03-14 MED ORDER — DICYCLOMINE HCL 10 MG PO CAPS: 10.0000 mg | ORAL_CAPSULE | Freq: Three times a day (TID) | ORAL | Status: DC | PRN

## 2012-03-14 MED ORDER — METRONIDAZOLE 500 MG PO TABS: 500.0000 mg | ORAL_TABLET | Freq: Two times a day (BID) | ORAL | Status: DC

## 2012-03-14 MED ORDER — CIPROFLOXACIN HCL 500 MG PO TABS: 500.0000 mg | ORAL_TABLET | Freq: Two times a day (BID) | ORAL | Status: DC

## 2012-03-14 NOTE — Patient Instructions (Addendum)
Abdominopelvic CT with contrast to be scheduled. Call with progress report in 2 days. Dicyclomine 10 mg by mouth up to 3 times a day as needed. Cipro 500 mg by mouth twice daily for 10 days. Metronidazole 500 mg by mouth twice daily for 10 days. Metamucil 4 grams by mouth daily.

## 2012-03-14 NOTE — Progress Notes (Signed)
Presenting complaint;  Epigastric pain. Stool still not normal. Subjective:  Patient is 41 year old Caucasian female who was last seen in her office on 03/03/2012 by Ms. Raynelle Bring NP for abdominal pain and constipation. She was advised to use 2 fleets enemas and begun on Linzess. She is still taking polyethylene glycol. Was she's been previously evaluated for epigastric pain this episode started in in March 2013 when she was admitted to [facility] for alcoholic pancreatitis. Patient states pain has never left. Most days pain is mild to moderate every now and then she has severe pain. Last episode occurred on 02/27/2012 when she was evaluated in emergency room. ER physician felt she had kidney stone but unenhanced CT was negative and so was urinalysis. She also normal lipase and transaminases. At times she feels as if she has softball in her epigastric region. Now she complains of passing yellow watery stools. She is not having normal stools. She denies melena or rectal bleeding. She tells me she hasn't had any alcohol in the last 6 months. Her weight has been stable and her appetite normal. She states she has been under a lot of stress. Both her parents are sick and she also has to deal with her son was drug addiction. She takes ibuprofen no more than once or twice a month for migraine. Today patient is also complaining of painful bump on her left gluteal region close to anal opening. Prior GI workup is as follows; In June 2009 she had EGD because of epigastric pain nausea and vomiting. She had gastroduodenitis but H. pylori serology was negative. In July 2009 she had colonoscopy for diarrhea and rectal bleeding and found to have internal hemorrhoids. Random biopsy from sigmoid colon was unremarkable. In June 2010 she had EGD at [facility] by me revealing focal gastritis. She was hospitalized with abdominal pain and mildly elevated transaminases. MRCP was negative. She was sent to and she pH for EUS  which reveals no abnormality to bile duct or pancreas reveals small antral ulcers.  Current Medications: Current Outpatient Prescriptions  Medication Sig Dispense Refill  . ALPRAZolam (XANAX) 1 MG tablet Take 1 mg by mouth 2 (two) times daily as needed. For anxiety       . Cyanocobalamin (B-12 PO) Take 1 tablet by mouth daily.        Marland Kitchen ibuprofen (ADVIL,MOTRIN) 200 MG tablet Take 200-800 mg by mouth 2 (two) times daily as needed. For pain      . levothyroxine (SYNTHROID, LEVOTHROID) 100 MCG tablet Take 100 mcg by mouth every morning.        Marland Kitchen Linaclotide (LINZESS) 145 MCG CAPS Take by mouth daily.      Marland Kitchen omeprazole (PRILOSEC) 20 MG capsule Take 20 mg by mouth daily as needed. For acid reflux       . polyethylene glycol (MIRALAX / GLYCOLAX) packet Take 17 g by mouth daily.      Marland Kitchen PROBIOTIC CAPS Take 1 capsule by mouth daily.      . promethazine (PHENERGAN) 12.5 MG tablet Take 1 tablet (12.5 mg total) by mouth every 6 (six) hours as needed for nausea.  20 tablet  0  . promethazine (PHENERGAN) 25 MG tablet Take 1 tablet (25 mg total) by mouth every 6 (six) hours as needed for nausea.  15 tablet  0  . sucralfate (CARAFATE) 1 G tablet Take 1 tablet (1 g total) by mouth 4 (four) times daily.  40 tablet  1    Objective: Blood  pressure 118/70, pulse 68, temperature 98.6 F (37 C), temperature source Oral, resp. rate 18, height 5\' 6"  (1.676 m), weight 151 lb 1.6 oz (68.539 kg). Patient is alert and in no acute distress. Conjunctiva is pink. Sclera is nonicteric Oropharyngeal mucosa is normal. No neck masses or thyromegaly noted. Cardiac exam with regular rhythm normal S1 and S2. No murmur or gallop noted. Lungs are clear to auscultation. Abdomen is symmetrical. She has a tattoo in hypogastric region. Bowel sounds are normal. Abdomen is soft with mild midepigastric tenderness. No organomegaly or masses noted. Rectal examination was limited to external exam revealing area of skin at left gluteal  region about 4 cm from anus with erythema induration and tenderness. No fluctuation noted. No LE edema or clubbing noted.  Labs/studies Results: Lab studies from 02/27/2012 reviewed. Lipase was 29, bili oh 0.2, AP 70, AST 21, ALT 27.  Assessment:  #1. Acute on chronic epigastric pain. She has had EGD in June 2009 and June 2010 and an EUS in June 2010. No source of symptoms was identified. She was treated for alcoholic pancreatitis 6 months ago recent unenhanced CT did not show abnormality to pancreas. She is status post cholecystectomy. She could have chronic dyspepsia her pain could be secondary to IBS. #2. History of constipation. Now with loose stools that she is taking to medications. #3. Cellulitis at left gluteal region close to anal opening. Doubt that this is beginning of perianal fistula but will monitor. If she develops an abscess she will need I&D. Plan:  Discontinue polyethylene glycol. Metamucil 4 g by mouth daily. Dicyclomine 10 mg by mouth 3 times a day when necessary Cipro 500 mg by mouth twice a day for 10 days. Metronidazole 500 mg by mouth twice a day for 10 days. She'll call with progress report in 2 days. Abdominopelvic CT with contrast.

## 2012-03-16 ENCOUNTER — Encounter (HOSPITAL_COMMUNITY): Payer: Self-pay | Admitting: Emergency Medicine

## 2012-03-16 ENCOUNTER — Emergency Department (HOSPITAL_COMMUNITY)
Admission: EM | Admit: 2012-03-16 | Discharge: 2012-03-16 | Disposition: A | Discharge status: Home / Self Care | Payer: BC Managed Care – PPO | Attending: Emergency Medicine | Admitting: Emergency Medicine

## 2012-03-16 ENCOUNTER — Telehealth (INDEPENDENT_AMBULATORY_CARE_PROVIDER_SITE_OTHER): Payer: Self-pay | Admitting: *Deleted

## 2012-03-16 DIAGNOSIS — K589 Irritable bowel syndrome without diarrhea: Secondary | ICD-10-CM | POA: Insufficient documentation

## 2012-03-16 DIAGNOSIS — E079 Disorder of thyroid, unspecified: Secondary | ICD-10-CM | POA: Insufficient documentation

## 2012-03-16 DIAGNOSIS — L0231 Cutaneous abscess of buttock: Secondary | ICD-10-CM | POA: Insufficient documentation

## 2012-03-16 DIAGNOSIS — Z79899 Other long term (current) drug therapy: Secondary | ICD-10-CM | POA: Insufficient documentation

## 2012-03-16 DIAGNOSIS — L03317 Cellulitis of buttock: Secondary | ICD-10-CM | POA: Insufficient documentation

## 2012-03-16 HISTORY — DX: Migraine, unspecified, not intractable, without status migrainosus: G43.909

## 2012-03-16 MED ORDER — FENTANYL CITRATE 0.05 MG/ML IJ SOLN
INTRAMUSCULAR | Status: DC | PRN
  Administered 2012-03-16: 50 ug via INTRAVENOUS

## 2012-03-16 MED ORDER — SODIUM CHLORIDE 0.9 % IV SOLN
INTRAVENOUS | Status: DC
  Administered 2012-03-16: 12:00:00 via INTRAVENOUS

## 2012-03-16 MED ORDER — OXYCODONE-ACETAMINOPHEN 5-325 MG PO TABS: 2.0000 | ORAL_TABLET | ORAL | Status: DC | PRN

## 2012-03-16 MED ORDER — ETOMIDATE 2 MG/ML IV SOLN
INTRAVENOUS | Status: DC | PRN
  Administered 2012-03-16: 10 mg via INTRAVENOUS

## 2012-03-16 MED ORDER — FENTANYL CITRATE 0.05 MG/ML IJ SOLN
INTRAMUSCULAR | Status: AC
  Administered 2012-03-16: 50 ug via INTRAVENOUS
  Filled 2012-03-16: qty 2

## 2012-03-16 MED ORDER — ETOMIDATE 2 MG/ML IV SOLN
10.0000 mg | Freq: Once | INTRAVENOUS | Status: AC
  Administered 2012-03-16: 10 mg via INTRAVENOUS
  Filled 2012-03-16: qty 10

## 2012-03-16 MED ORDER — FENTANYL CITRATE 0.05 MG/ML IJ SOLN
50.0000 ug | Freq: Once | INTRAMUSCULAR | Status: AC
  Administered 2012-03-16 (×2): 50 ug via INTRAVENOUS
  Filled 2012-03-16: qty 2

## 2012-03-16 MED ORDER — LIDOCAINE HCL (PF) 2 % IJ SOLN
10.0000 mL | Freq: Once | INTRAMUSCULAR | Status: AC
  Administered 2012-03-16: 10 mL
  Filled 2012-03-16: qty 10

## 2012-03-16 MED ORDER — FENTANYL CITRATE 0.05 MG/ML IJ SOLN
100.0000 ug | Freq: Once | INTRAMUSCULAR | Status: AC
  Administered 2012-03-16: 100 ug via INTRAVENOUS

## 2012-03-16 MED ORDER — FENTANYL CITRATE 0.05 MG/ML IJ SOLN: 50.0000 ug | Freq: Once | INTRAMUSCULAR | Status: DC

## 2012-03-16 NOTE — Telephone Encounter (Signed)
[name] has called this morning and she states that the place on her right buttocks has gotten worse. Very swollen and red. The center of if on yesterday was a pinpoint size. It drained enough to fill one side of a Q-Tip. Per Dr.Rehman refer to a surgeron for I&D today. If a surgeron cannot see her she is to go to the ED. The patient was made aware she has no preference for a surgeron.

## 2012-03-16 NOTE — ED Notes (Addendum)
Pt reports that she was seen on Monday of last week at Dr. Patty Sermons office for stomach pain. Pt reports that the "abscess" developed on Monday wand showed it to Dr. [name]. Dr . [name] stated that if it hasn't gotten any better by Wednesday then he would refer her to someone who could surgically drain it.All the surgeons are unavailable today so the pt came in to the ED.

## 2012-03-16 NOTE — ED Provider Notes (Signed)
History   This chart was scribed for Alyssa Cooper III, MD by [name]. This patient was seen in room APA19/[facility]19 and the patient's care was started at 10:23AM.   CSN: [id number]  Arrival date & time 03/16/12  4098   First MD Initiated Contact with Patient 03/16/12 1015      Chief Complaint  Patient presents with  . Abscess    (Consider location/radiation/quality/duration/timing/severity/associated sxs/prior treatment) Patient is a 41 y.o. female presenting with abscess. The history is provided by the patient. No language interpreter was used.  Abscess    [name] is a 41 y.o. female who presents to the Emergency Department complaining of gradually worsening buttock pain beginning 1 week ago diagnosed as an abscess on left buttock 2 days ago by Dr. [name].  Pain that is currently severe, constant, non-radiating and is worsened when seated and improved when lying on sides.  Pt saw Dr. [name] Monday where abscess was described as the size of a golf ball and pt was informed she would need it drained if not improved by Wednesday (today).  All referred surgeons are currently unavailable, causing pt to report here.  Pt reports being generally healthy with no h/o abscesses.  Pt has h/o IBS, diverticulitis, ulcerative colitis, and anxiety.  Pt has had appendectomy.  Pt is a current everyday smoker (1pack/day) and reports social alcohol use.    Past Medical History  Diagnosis Date  . Anxiety   . IBS (irritable bowel syndrome)   . Thyroid disease   . Diverticula of intestine   . Ulcerative colitis     Past Surgical History  Procedure Date  . Abdominal hysterectomy   . Appendectomy   . Cholecystectomy   . Knee arthroscopy     Family History  Problem Relation Age of Onset  . Diabetes Mother   . COPD Mother   . Heart disease Mother   . Breast cancer Paternal Grandmother   . Lung cancer Paternal Grandfather     History  Substance Use Topics  . Smoking status: Current  Every Day Smoker -- 1.0 packs/day for 20 years    Types: Cigarettes  . Smokeless tobacco: Never Used  . Alcohol Use: No     socially    No Ob history provided.  Review of Systems  All other systems reviewed and are negative.    Allergies  Oxycodone and Morphine and related  Home Medications   Current Outpatient Rx  Name Route Sig Dispense Refill  . ALPRAZOLAM 1 MG PO TABS Oral Take 1 mg by mouth 2 (two) times daily as needed. For anxiety     . CIPROFLOXACIN HCL 500 MG PO TABS Oral Take 1 tablet (500 mg total) by mouth 2 (two) times daily. 20 tablet 0  . B-12 PO Oral Take 1 tablet by mouth daily.      Marland Kitchen DICYCLOMINE HCL 10 MG PO CAPS Oral Take 1 capsule (10 mg total) by mouth 3 (three) times daily as needed. 90 capsule 0  . IBUPROFEN 200 MG PO TABS Oral Take 200-800 mg by mouth 2 (two) times daily as needed. For pain    . LEVOTHYROXINE SODIUM 100 MCG PO TABS Oral Take 100 mcg by mouth every morning.      Marland Kitchen LINACLOTIDE 145 MCG PO CAPS Oral Take by mouth daily.    Marland Kitchen METRONIDAZOLE 500 MG PO TABS Oral Take 1 tablet (500 mg total) by mouth 2 (two) times daily. 20 tablet 0  . OMEPRAZOLE  20 MG PO CPDR Oral Take 20 mg by mouth daily as needed. For acid reflux     . PROBIOTIC PO CAPS Oral Take 1 capsule by mouth daily.    Marland Kitchen PROMETHAZINE HCL 12.5 MG PO TABS Oral Take 1 tablet (12.5 mg total) by mouth every 6 (six) hours as needed for nausea. 20 tablet 0  . PROMETHAZINE HCL 25 MG PO TABS Oral Take 1 tablet (25 mg total) by mouth every 6 (six) hours as needed for nausea. 15 tablet 0    BP 116/70  Pulse 97  Temp 99 F (37.2 C) (Oral)  Resp 20  Ht 5\' 6"  (1.676 m)  Wt 149 lb (67.586 kg)  BMI 24.05 kg/m2  SpO2 97%  Physical Exam  Nursing note and vitals reviewed. Constitutional: She is oriented to person, place, and time. She appears well-developed.  HENT:  Head: Normocephalic and atraumatic.  Eyes: Conjunctivae normal and EOM are normal. No scleral icterus.  Neck: Normal range of  motion. Neck supple. No thyromegaly present.  Cardiovascular: Normal rate and regular rhythm.  Exam reveals no gallop and no friction rub.   No murmur heard. Pulmonary/Chest: Effort normal. No stridor. She has no wheezes. She has no rales. She exhibits no tenderness.  Abdominal: She exhibits no distension. There is no tenderness. There is no rebound.  Genitourinary:       Abscess 2cm in diameter on left buttocks surrounded by some cellulitis.    Musculoskeletal: Normal range of motion. She exhibits no edema.  Lymphadenopathy:    She has no cervical adenopathy.  Neurological: She is alert and oriented to person, place, and time. Coordination normal.  Skin: Skin is warm. No rash noted. No erythema.  Psychiatric: She has a normal mood and affect. Her behavior is normal.    ED Course  Procedural sedation Date/Time: 03/16/2012 11:52 AM Performed by: Osvaldo Human Authorized by: Osvaldo Human Consent: Verbal consent obtained. Risks and benefits: risks, benefits and alternatives were discussed Consent given by: patient Patient understanding: patient states understanding of the procedure being performed Patient consent: the patient's understanding of the procedure matches consent given Patient identity confirmed: verbally with patient Time out: Immediately prior to procedure a "time out" was called to verify the correct patient, procedure, equipment, support staff and site/side marked as required. Preparation: Patient was prepped and draped in the usual sterile fashion. Local anesthesia used: yes Anesthesia: local infiltration Local anesthetic: lidocaine 2% without epinephrine Patient sedated: yes Sedatives: etomidate and fentanyl Vitals: Vital signs were monitored during sedation. Patient tolerance: Patient tolerated the procedure well with no immediate complications.  INCISION AND DRAINAGE Date/Time: 03/16/2012 11:53 AM Performed by: Osvaldo Human Authorized by: Osvaldo Human Consent: Verbal consent obtained. Risks and benefits: risks, benefits and alternatives were discussed Consent given by: patient Patient understanding: patient states understanding of the procedure being performed Patient consent: the patient's understanding of the procedure matches consent given Site marked: the operative site was not marked Patient identity confirmed: verbally with patient Time out: Immediately prior to procedure a "time out" was called to verify the correct patient, procedure, equipment, support staff and site/side marked as required. Type: abscess Body area: lower extremity Location details: left buttock Anesthesia: local infiltration Local anesthetic: lidocaine 2% without epinephrine Patient sedated: yes Sedation type: moderate (conscious) sedation Sedatives: etomidate and fentanyl Vitals: Vital signs were monitored during sedation. Scalpel size: 11 Needle gauge: 22 Incision type: single straight Complexity: simple Drainage: purulent Drainage amount: moderate Wound treatment:  drain placed Packing material: 1/4 in iodoform gauze Patient tolerance: Patient tolerated the procedure well with no immediate complications. Comments: Culture of abscess fluid obtained.   (including critical care time) DIAGNOSTIC STUDIES: Oxygen Saturation is 97% on room air, adequate by my interpretation.    COORDINATION OF CARE: 10:28AM- Ordered IV fluids, xylocaine injection, abscess culture.  Discussed incision and drainage.    11:55 AM I&D of abscess left buttock performed.  Pt is already on antibiotic medicine.  Rx Percocet po q4h prn pain.  Warm soaks in tub bid.  Return in 2 days for removal of packing.   1. Abscess of left buttock     I personally performed the services described in this documentation, which was scribed in my presence. The recorded information has been reviewed and considered.  Osvaldo Human, MD       Alyssa Cooper III,  MD 03/16/12 [phone]

## 2012-03-16 NOTE — ED Notes (Signed)
Pt received 2 doses of Fentanyl IV and received one dose IV 100 mcg of Fentanyl. Pt received 2 doses of Etomidate 10 mg each.

## 2012-03-16 NOTE — Telephone Encounter (Signed)
Drs.Jenkins/Ziegler both are in surgery today. Dr.Ziegler is on call per office staff. They suggested that patient go to the ED. Patient was called and advised. Her Mom is going to take her.

## 2012-03-17 ENCOUNTER — Ambulatory Visit (HOSPITAL_COMMUNITY): Payer: BC Managed Care – PPO

## 2012-03-19 LAB — CULTURE, ROUTINE-ABSCESS

## 2012-03-20 NOTE — ED Notes (Signed)
+  Abscess. +MRSA. Chart sent to EDP office for review. °

## 2012-03-23 ENCOUNTER — Telehealth (INDEPENDENT_AMBULATORY_CARE_PROVIDER_SITE_OTHER): Payer: Self-pay | Admitting: *Deleted

## 2012-03-23 NOTE — Telephone Encounter (Signed)
CT has been canceled

## 2012-03-23 NOTE — Telephone Encounter (Signed)
[name] called and states that on last Friday she had to have surgery on her buttock. The place was MRSA. She is [facility] [place], She has 2 weeks of therapy. Due to her not being able to lay on her back she will need to cancel the CT that is planned for this Friday. She will call our office in 2 weeks to make another appointment.

## 2012-03-23 NOTE — Telephone Encounter (Signed)
Agree to postpone CT until acute condition resolved.

## 2012-03-24 NOTE — ED Notes (Signed)
rx for Bactrim DS Sig: 1 tab po BID x 7 days disp :14 per [facility]

## 2012-03-25 ENCOUNTER — Telehealth (HOSPITAL_COMMUNITY): Payer: Self-pay | Admitting: *Deleted

## 2012-03-25 ENCOUNTER — Ambulatory Visit (HOSPITAL_COMMUNITY): Payer: BC Managed Care – PPO

## 2012-04-01 ENCOUNTER — Encounter (INDEPENDENT_AMBULATORY_CARE_PROVIDER_SITE_OTHER): Payer: Self-pay

## 2013-01-16 ENCOUNTER — Observation Stay (HOSPITAL_COMMUNITY)
Admission: EM | Admit: 2013-01-16 | Discharge: 2013-01-17 | Disposition: A | Discharge status: Home / Self Care | Payer: BC Managed Care – PPO | Attending: Internal Medicine | Admitting: Internal Medicine

## 2013-01-16 ENCOUNTER — Encounter (HOSPITAL_COMMUNITY): Payer: Self-pay | Admitting: Emergency Medicine

## 2013-01-16 ENCOUNTER — Emergency Department (HOSPITAL_COMMUNITY): Payer: BC Managed Care – PPO

## 2013-01-16 DIAGNOSIS — F419 Anxiety disorder, unspecified: Secondary | ICD-10-CM | POA: Diagnosis present

## 2013-01-16 DIAGNOSIS — R0602 Shortness of breath: Secondary | ICD-10-CM | POA: Insufficient documentation

## 2013-01-16 DIAGNOSIS — F411 Generalized anxiety disorder: Secondary | ICD-10-CM | POA: Insufficient documentation

## 2013-01-16 DIAGNOSIS — F17200 Nicotine dependence, unspecified, uncomplicated: Secondary | ICD-10-CM | POA: Insufficient documentation

## 2013-01-16 DIAGNOSIS — R079 Chest pain, unspecified: Principal | ICD-10-CM | POA: Diagnosis present

## 2013-01-16 DIAGNOSIS — Z720 Tobacco use: Secondary | ICD-10-CM | POA: Diagnosis present

## 2013-01-16 DIAGNOSIS — E785 Hyperlipidemia, unspecified: Secondary | ICD-10-CM | POA: Diagnosis present

## 2013-01-16 DIAGNOSIS — R519 Headache, unspecified: Secondary | ICD-10-CM | POA: Diagnosis not present

## 2013-01-16 DIAGNOSIS — R51 Headache: Secondary | ICD-10-CM | POA: Insufficient documentation

## 2013-01-16 LAB — CBC WITH DIFFERENTIAL/PLATELET
Basophils Absolute: 0.1 10*3/uL (ref 0.0–0.1)
Basophils Relative: 1 % (ref 0–1)
Eosinophils Absolute: 0.3 10*3/uL (ref 0.0–0.7)
Eosinophils Relative: 4 % (ref 0–5)
HCT: 37.9 % (ref 36.0–46.0)
Hemoglobin: 13.4 g/dL (ref 12.0–15.0)
Lymphocytes Relative: 47 % — ABNORMAL HIGH (ref 12–46)
Lymphs Abs: 4.1 10*3/uL — ABNORMAL HIGH (ref 0.7–4.0)
MCH: 33 pg (ref 26.0–34.0)
MCHC: 35.4 g/dL (ref 30.0–36.0)
MCV: 93.3 fL (ref 78.0–100.0)
Monocytes Absolute: 0.4 10*3/uL (ref 0.1–1.0)
Monocytes Relative: 5 % (ref 3–12)
Neutro Abs: 3.7 10*3/uL (ref 1.7–7.7)
Neutrophils Relative %: 43 % (ref 43–77)
Platelets: 209 10*3/uL (ref 150–400)
RBC: 4.06 MIL/uL (ref 3.87–5.11)
RDW: 13.2 % (ref 11.5–15.5)
WBC: 8.7 10*3/uL (ref 4.0–10.5)

## 2013-01-16 LAB — BASIC METABOLIC PANEL
BUN: 11 mg/dL (ref 6–23)
CO2: 24 mEq/L (ref 19–32)
Calcium: 9.7 mg/dL (ref 8.4–10.5)
Chloride: 105 mEq/L (ref 96–112)
Creatinine, Ser: 0.99 mg/dL (ref 0.50–1.10)
GFR calc Af Amer: 82 mL/min — ABNORMAL LOW (ref >90)
GFR calc non Af Amer: 70 mL/min — ABNORMAL LOW (ref >90)
Glucose, Bld: 91 mg/dL (ref 70–99)
Potassium: 3.7 mEq/L (ref 3.5–5.1)
Sodium: 140 mEq/L (ref 135–145)

## 2013-01-16 LAB — TROPONIN I: Troponin I: <0.3 ng/mL (ref <0.30)

## 2013-01-16 MED ORDER — LORAZEPAM 2 MG/ML IJ SOLN: 1.0000 mg | Freq: Once | INTRAMUSCULAR | Status: DC

## 2013-01-16 MED ORDER — NITROGLYCERIN 2 % TD OINT
0.5000 [in_us] | TOPICAL_OINTMENT | Freq: Once | TRANSDERMAL | Status: AC
  Administered 2013-01-16: 0.5 [in_us] via TOPICAL
  Filled 2013-01-16: qty 1

## 2013-01-16 MED ORDER — ASPIRIN 81 MG PO CHEW
324.0000 mg | CHEWABLE_TABLET | Freq: Once | ORAL | Status: AC
  Administered 2013-01-16: 324 mg via ORAL
  Filled 2013-01-16: qty 4

## 2013-01-16 MED ORDER — LORAZEPAM 1 MG PO TABS
1.0000 mg | ORAL_TABLET | Freq: Once | ORAL | Status: AC
  Administered 2013-01-16: 1 mg via ORAL
  Filled 2013-01-16: qty 1

## 2013-01-16 NOTE — ED Notes (Signed)
Onset of intermittant chest discomfort since last week, worse today with heavy ache in chest and left breast under left arm.  Thought it was anxiety but her medication has not helped

## 2013-01-16 NOTE — ED Notes (Signed)
MD at bedside. 

## 2013-01-16 NOTE — ED Notes (Signed)
Pt reports she is still feeling agitated. C/o headache after the application of nitro paste. Lights in room were dimmed and pt given cool wash rag.

## 2013-01-16 NOTE — H&P (Signed)
[facility] History and Physical  LAURY HUIZAR  UJW:[id number]  DOB: July 19, 1970   DOA: 01/16/2013   PCP:   [name], MD   Chief Complaint:  Chest pain since today  HPI: [name] is a 42 y.o. female.   Young Caucasian lady smokes one pack of cigarettes per day, strong family history of heart disease, and a personal history of severe anxiety, presents to the emergency room because of central chest pain radiating to her neck and arms since today; associated with shortness of breath and nausea; mildly diaphoretic. She felt it was related to her anxiety and took her tablets of Xanax and was surprised when this did not relieve the discomfort. She contacted the nurse on our insurance companies help line who recommended she come to the emergency room for evaluation.  In the emergency room, she appeared anxious,  nitroglycerin made her feel sicker, she did not improve with intravenous Ativan, and the hospitalist service was called because of persistent chest pain.  Smokes 1ppd  Rewiew of Systems:   All systems negative except as marked bold or noted in the HPI;  Constitutional:    malaise, fever and chills. ;  Eyes:   eye pain, redness and discharge. ;  ENMT:   ear pain, hoarseness, nasal congestion, sinus pressure and sore throat. ;  Cardiovascular:    chest pain, palpitations, diaphoresis, dyspnea and peripheral edema.  Respiratory:   cough, hemoptysis, wheezing and stridor. ;  Gastrointestinal:  nausea, vomiting, diarrhea, constipation, abdominal pain, melena, blood in stool, hematemesis, jaundice and rectal bleeding. unusual weight loss..   Genitourinary:    frequency, dysuria, incontinence,flank pain and hematuria; Musculoskeletal:   back pain and neck pain.  swelling and trauma.;  Skin: .  pruritus, rash, abrasions, bruising and skin lesion.; ulcerations Neuro:    headache, lightheadedness and neck stiffness.  weakness, altered level of consciousness, altered mental  status, extremity weakness, burning feet, involuntary movement, seizure and syncope.  Psych:    anxiety, depression, insomnia, tearfulness, panic attacks, hallucinations, paranoia, suicidal or homicidal ideation    Past Medical History  Diagnosis Date  . Anxiety   . IBS (irritable bowel syndrome)   . Thyroid disease   . Diverticula of intestine   . Ulcerative colitis   . Migraines     Past Surgical History  Procedure Laterality Date  . Abdominal hysterectomy    . Appendectomy    . Cholecystectomy    . Knee arthroscopy      Medications:  HOME MEDS: Prior to Admission medications   Medication Sig Start Date End Date Taking? Authorizing Provider  ALPRAZolam Prudy Feeler) 1 MG tablet Take 1 mg by mouth 2 (two) times daily as needed. For anxiety    Yes Historical Provider, MD  ibuprofen (ADVIL,MOTRIN) 200 MG tablet Take 200-800 mg by mouth 2 (two) times daily as needed. For pain   Yes Historical Provider, MD  Pseudoephedrine-Acetaminophen (SINUS PO) Take 1 tablet by mouth daily as needed (for sinus relief).   Yes Historical Provider, MD     Allergies:  Allergies  Allergen Reactions  . Oxycodone Itching  . Morphine And Related Itching and Rash    Social History:   reports that she has been smoking Cigarettes.  She has a 20 pack-year smoking history. She has never used smokeless tobacco. She reports that she does not drink alcohol or use illicit drugs.  Family History: Family History  Problem Relation Age of Onset  . Diabetes Mother   .  COPD Mother   . Heart disease Mother   . Breast cancer Paternal Grandmother   . Lung cancer Paternal Grandfather   Father: Heart disease  Started in his early 36's Mother's heart dz started in her early 40's   Physical Exam: Filed Vitals:   01/16/13 1924 01/16/13 2204 01/16/13 2300  BP: 136/106 117/69 107/75  Pulse: 96 69 62  Temp: 98.1 F (36.7 C)  98.2 F (36.8 C)  TempSrc: Oral  Oral  Resp: 20 18 18   Height: 5' 6.5" (1.689 m)  5'  6" (1.676 m)  Weight: 63.504 kg (140 lb)  64.139 kg (141 lb 6.4 oz)  SpO2: 100% 98% 97%   Blood pressure 107/75, pulse 62, temperature 98.2 F (36.8 C), temperature source Oral, resp. rate 18, height 5\' 6"  (1.676 m), weight 64.139 kg (141 lb 6.4 oz), SpO2 97.00%. Body mass index is 22.83 kg/(m^2).   GEN:  Pleasant young Caucasian lady lying bed in no acute distress; cooperative with exam PSYCH:  alert and oriented x4;  anxious nor depressed; affect is appropriate. HEENT: Mucous membranes pink and anicteric; PERRLA; EOM intact; no cervical lymphadenopathy nor thyromegaly or carotid bruit; no JVD; Breasts:: Not examined CHEST WALL: tender left medial breast CHEST: Normal respiration, clear to auscultation bilaterally HEART: Regular rate and rhythm; no murmurs rubs or gallops BACK: No kyphosis no scoliosis; no CVA tenderness ABDOMEN: soft non-tender; no masses, no organomegaly, normal abdominal bowel sounds; no pannus; no intertriginous candida. Rectal Exam: Not done EXTREMITIES: No bone or joint deformity; no edema; no ulcerations. Genitalia: not examined PULSES: 2+ and symmetric SKIN: Normal hydration no rash or ulceration CNS: Cranial nerves 2-12 grossly intact no focal lateralizing neurologic deficit   Labs on Admission:  Basic Metabolic Panel:  Recent Labs Lab 01/16/13 1957  NA 140  K 3.7  CL 105  CO2 24  GLUCOSE 91  BUN 11  CREATININE 0.99  CALCIUM 9.7   Liver Function Tests: No results found for this basename: AST, ALT, ALKPHOS, BILITOT, PROT, ALBUMIN,  in the last 168 hours No results found for this basename: LIPASE, AMYLASE,  in the last 168 hours No results found for this basename: AMMONIA,  in the last 168 hours CBC:  Recent Labs Lab 01/16/13 1957  WBC 8.7  NEUTROABS 3.7  HGB 13.4  HCT 37.9  MCV 93.3  PLT 209   Cardiac Enzymes:  Recent Labs Lab 01/16/13 1957  TROPONINI <0.30   BNP: No components found with this basename: POCBNP,  D-dimer: No  components found with this basename: D-DIMER,  CBG: No results found for this basename: GLUCAP,  in the last 168 hours  Radiological Exams on Admission: Dg Chest 2 View  01/16/2013   *RADIOLOGY REPORT*  Clinical Data: Chest pain, chills, shortness of breath.  CHEST - 2 VIEW  Comparison: 09/26/2009  Findings: Heart and mediastinal contours are within normal limits. No focal opacities or effusions.  No acute bony abnormality.  IMPRESSION: No active cardiopulmonary disease.   Original Report Authenticated By: Charlett Nose, M.D.    EKG: Independently reviewed. Normal sinus rhythm, no abnormalities   Assessment/Plan    Active Problems:   Anxiety disorder   Chest pain   Tobacco abuse  PLAN: Because of persistent chest pain and bring lady on observation to rule out acute myocardial event Treat with scheduled ibuprofen IV fluids and PPI Continue Xanax as necessary Other plans as per orders.  Code Status: Full Family Communication:  Plans discuss with patient and husband  at bedside Disposition Plan: Likely home in the morning if no chemical or electrical change is detected overnight    Allison Deshotels Nocturnist [facility] Pager [phone]   01/16/2013, 11:37 PM

## 2013-01-16 NOTE — ED Provider Notes (Signed)
CSN: [id number]     Arrival date & time 01/16/13  1923 History    42 year old female with chest pain. Symptoms initially started about a week ago. She has been having intermittent, very brief shocklike pain in the center of her chest and into her left neck. No appreciable exacerbating relieving factors. Seemingly comes and goes randomly. This morning she additionally began having L sided CP which is different in character. Pressure sensation deep within her L chest. "I feel like if I could just move my boob in the right way then I could get it to go away." Pain is not in the breast itself though. Also achy sensation in her left scapular region. Numbness in her left hand. Her hand feels "asleep" or that "it's not getting enough blood". Initially these symptoms were intermittent.They have been relatively constant for the last several hours though. Mild shortness of breath. Patient has a past history of anxiety, states that her current symptoms are different than the way she typically feels when she is anxious. She tried taking some Xanax without any significant change. No history of coronary artery disease. Patient is a smoker. No unusual leg pain or swelling. No n/v. No diaphoresis.    First MD Initiated Contact with Patient 01/16/13 1932     Chief Complaint  Patient presents with  . Chest Pain   (Consider location/radiation/quality/duration/timing/severity/associated sxs/prior Treatment) HPI  Past Medical History  Diagnosis Date  . Anxiety   . IBS (irritable bowel syndrome)   . Thyroid disease   . Diverticula of intestine   . Ulcerative colitis   . Migraines    Past Surgical History  Procedure Laterality Date  . Abdominal hysterectomy    . Appendectomy    . Cholecystectomy    . Knee arthroscopy     Family History  Problem Relation Age of Onset  . Diabetes Mother   . COPD Mother   . Heart disease Mother   . Breast cancer Paternal Grandmother   . Lung cancer Paternal Grandfather     History  Substance Use Topics  . Smoking status: Current Every Day Smoker -- 1.00 packs/day for 20 years    Types: Cigarettes  . Smokeless tobacco: Never Used  . Alcohol Use: No     Comment: socially   OB History   Grav Para Term Preterm Abortions TAB SAB Ect Mult Living                 Review of Systems  All systems reviewed and negative, other than as noted in HPI.  Allergies  Oxycodone and Morphine and related  Home Medications   Current Outpatient Rx  Name  Route  Sig  Dispense  Refill  . ALPRAZolam (XANAX) 1 MG tablet   Oral   Take 1 mg by mouth 2 (two) times daily as needed. For anxiety          . ibuprofen (ADVIL,MOTRIN) 200 MG tablet   Oral   Take 200-800 mg by mouth 2 (two) times daily as needed. For pain         . Pseudoephedrine-Acetaminophen (SINUS PO)   Oral   Take 1 tablet by mouth daily as needed (for sinus relief).          BP 136/106  Pulse 96  Temp(Src) 98.1 F (36.7 C) (Oral)  Resp 20  Ht 5' 6.5" (1.689 m)  Wt 140 lb (63.504 kg)  BMI 22.26 kg/m2  SpO2 100% Physical Exam  Nursing note and vitals  reviewed. Constitutional: She appears well-developed and well-nourished. No distress.  HENT:  Head: Normocephalic and atraumatic.  Eyes: Conjunctivae are normal. Pupils are equal, round, and reactive to light. Right eye exhibits no discharge. Left eye exhibits no discharge.  Neck: Neck supple.  Cardiovascular: Normal rate, regular rhythm and normal heart sounds.  Exam reveals no gallop and no friction rub.   No murmur heard. Pulmonary/Chest: Effort normal and breath sounds normal. No respiratory distress. She has no wheezes. She has no rales. She exhibits no tenderness.  CP not reproducible  Abdominal: Soft. She exhibits no distension. There is no tenderness.  Musculoskeletal: She exhibits no edema and no tenderness.  Lower extremities symmetric as compared to each other. No calf tenderness. Negative Homan's. No palpable cords.    Neurological: She is alert.  Skin: Skin is warm and dry. She is not diaphoretic.  Psychiatric: She has a normal mood and affect. Her behavior is normal. Thought content normal.    ED Course   Procedures (including critical care time)  Labs Reviewed  CBC WITH DIFFERENTIAL - Abnormal; Notable for the following:    Lymphocytes Relative 47 (*)    Lymphs Abs 4.1 (*)    All other components within normal limits  TROPONIN I  BASIC METABOLIC PANEL   Dg Chest 2 View  01/16/2013   *RADIOLOGY REPORT*  Clinical Data: Chest pain, chills, shortness of breath.  CHEST - 2 VIEW  Comparison: 09/26/2009  Findings: Heart and mediastinal contours are within normal limits. No focal opacities or effusions.  No acute bony abnormality.  IMPRESSION: No active cardiopulmonary disease.   Original Report Authenticated By: Charlett Nose, M.D.   1. Chest pain     MDM  42 year old female with chest pain. Very normal looking EKG. Both typical and atypical features for ACS.   Brief, shocklike pain she is having atypical for ACS. The deep pressure sensation in her left chest and shoulder and numbness in her left hand is concerning though. I feel there is a strong anxiety component to her symptoms, but she is relating that current symptoms not how she typically feels when anxious. Doubt PE. Doubt infectious. Doubt dissection. Will admit for r/o.   Raeford Razor, MD 01/16/13 2214

## 2013-01-17 DIAGNOSIS — R51 Headache: Secondary | ICD-10-CM

## 2013-01-17 DIAGNOSIS — R519 Headache, unspecified: Secondary | ICD-10-CM | POA: Diagnosis not present

## 2013-01-17 DIAGNOSIS — E785 Hyperlipidemia, unspecified: Secondary | ICD-10-CM

## 2013-01-17 LAB — COMPREHENSIVE METABOLIC PANEL
ALT: 18 U/L (ref 0–35)
AST: 20 U/L (ref 0–37)
Albumin: 3.8 g/dL (ref 3.5–5.2)
Calcium: 8.8 mg/dL (ref 8.4–10.5)
Sodium: 141 mEq/L (ref 135–145)
Total Protein: 6.4 g/dL (ref 6.0–8.3)

## 2013-01-17 LAB — TSH: TSH: 227.278 u[IU]/mL — ABNORMAL HIGH (ref 0.350–4.500)

## 2013-01-17 LAB — LIPID PANEL
HDL: 32 mg/dL — ABNORMAL LOW (ref >39)
LDL Cholesterol: 131 mg/dL — ABNORMAL HIGH (ref 0–99)
Total CHOL/HDL Ratio: 6.2 RATIO

## 2013-01-17 LAB — URINALYSIS, ROUTINE W REFLEX MICROSCOPIC
Bilirubin Urine: NEGATIVE
Ketones, ur: NEGATIVE mg/dL
Nitrite: NEGATIVE
Specific Gravity, Urine: >1.03 — ABNORMAL HIGH (ref 1.005–1.030)
Urobilinogen, UA: 0.2 mg/dL (ref 0.0–1.0)

## 2013-01-17 LAB — TROPONIN I: Troponin I: <0.3 ng/mL (ref <0.30)

## 2013-01-17 LAB — CBC
MCH: 32.4 pg (ref 26.0–34.0)
MCHC: 34.2 g/dL (ref 30.0–36.0)
Platelets: 199 10*3/uL (ref 150–400)

## 2013-01-17 MED ORDER — ONDANSETRON HCL 4 MG/2ML IJ SOLN
4.0000 mg | INTRAMUSCULAR | Status: DC | PRN
  Administered 2013-01-17 (×2): 4 mg via INTRAVENOUS
  Filled 2013-01-17 (×2): qty 2

## 2013-01-17 MED ORDER — PANTOPRAZOLE SODIUM 40 MG IV SOLR
40.0000 mg | Freq: Two times a day (BID) | INTRAVENOUS | Status: DC
  Administered 2013-01-17 (×2): 40 mg via INTRAVENOUS
  Filled 2013-01-17 (×2): qty 40

## 2013-01-17 MED ORDER — ALPRAZOLAM 1 MG PO TABS
1.0000 mg | ORAL_TABLET | Freq: Two times a day (BID) | ORAL | Status: DC | PRN
  Administered 2013-01-17: 1 mg via ORAL
  Filled 2013-01-17: qty 1

## 2013-01-17 MED ORDER — PANTOPRAZOLE SODIUM 40 MG PO TBEC: 40.0000 mg | DELAYED_RELEASE_TABLET | Freq: Two times a day (BID) | ORAL | Status: DC

## 2013-01-17 MED ORDER — SODIUM CHLORIDE 0.9 % IJ SOLN
3.0000 mL | Freq: Two times a day (BID) | INTRAMUSCULAR | Status: DC
  Administered 2013-01-17 (×2): 3 mL via INTRAVENOUS

## 2013-01-17 MED ORDER — POTASSIUM CHLORIDE IN NACL 20-0.9 MEQ/L-% IV SOLN
INTRAVENOUS | Status: DC
  Administered 2013-01-17: 01:00:00 via INTRAVENOUS

## 2013-01-17 MED ORDER — ASPIRIN EC 81 MG PO TBEC
81.0000 mg | DELAYED_RELEASE_TABLET | Freq: Every day | ORAL | Status: DC
  Administered 2013-01-17: 81 mg via ORAL
  Filled 2013-01-17: qty 1

## 2013-01-17 MED ORDER — TRAZODONE HCL 50 MG PO TABS: 50.0000 mg | ORAL_TABLET | Freq: Every evening | ORAL | Status: DC | PRN

## 2013-01-17 MED ORDER — SUMATRIPTAN SUCCINATE 25 MG PO TABS: 25.0000 mg | ORAL_TABLET | ORAL | Status: DC | PRN

## 2013-01-17 MED ORDER — IBUPROFEN 600 MG PO TABS
600.0000 mg | ORAL_TABLET | Freq: Three times a day (TID) | ORAL | Status: DC
  Administered 2013-01-17 (×2): 600 mg via ORAL
  Filled 2013-01-17 (×2): qty 1

## 2013-01-17 MED ORDER — NICOTINE 21 MG/24HR TD PT24
21.0000 mg | MEDICATED_PATCH | Freq: Every day | TRANSDERMAL | Status: DC
  Administered 2013-01-17 (×2): 21 mg via TRANSDERMAL
  Filled 2013-01-17 (×2): qty 1

## 2013-01-17 MED ORDER — ACETAMINOPHEN 325 MG PO TABS
650.0000 mg | ORAL_TABLET | ORAL | Status: DC | PRN
  Administered 2013-01-17 (×2): 650 mg via ORAL
  Filled 2013-01-17 (×2): qty 2

## 2013-01-17 MED ORDER — SUMATRIPTAN SUCCINATE 25 MG PO TABS
25.0000 mg | ORAL_TABLET | ORAL | Status: AC
  Administered 2013-01-17: 25 mg via ORAL
  Filled 2013-01-17: qty 1

## 2013-01-17 MED ORDER — POLYETHYLENE GLYCOL 3350 17 G PO PACK: 17.0000 g | PACK | Freq: Every day | ORAL | Status: DC | PRN

## 2013-01-17 MED ORDER — ENOXAPARIN SODIUM 40 MG/0.4ML ~~LOC~~ SOLN
40.0000 mg | SUBCUTANEOUS | Status: DC
  Administered 2013-01-17: 40 mg via SUBCUTANEOUS
  Filled 2013-01-17: qty 0.4

## 2013-01-17 MED ORDER — NICOTINE 21 MG/24HR TD PT24: 1.0000 | MEDICATED_PATCH | TRANSDERMAL | Status: DC

## 2013-01-17 NOTE — Progress Notes (Signed)
Patient discharge  with instructions given on medications and follow up visits,patient verbalized understanding.Prescriptions sent with patient. Accompanied by staff to an awaiting vehicle.

## 2013-01-17 NOTE — Discharge Summary (Addendum)
Physician Discharge Summary  [name]:[id number] DOB: Dec 08, 1970 DOA: 01/16/2013  PCP: [name], MD  Admit date: 01/16/2013 Discharge date: 01/17/2013  Time spent: 25 minutes  Recommendations for Outpatient Follow-up:  1. Patient will follow up with her PCP in the next month  Discharge Diagnoses:  Principal Problem:   Chest pain Active Problems:   Anxiety disorder   Tobacco abuse  headache Hyperlipidemia  Discharge Condition: Improved, being discharged home  Diet recommendation: Regular  Filed Weights   01/16/13 2300 01/17/13 0417 01/17/13 0500  Weight: 64.139 kg (141 lb 6.4 oz) 64.864 kg (143 lb) 64.864 kg (143 lb)    History of present illness:  On 7/28, patient was admitted. She is a 42 year old white female past history of tobacco abuse and severe anxiety with strong family history of heart disease who was admitted with a one-day history of central chest pain radiating to her neck and arms associated with shortness of breath and nausea. She does relate to anxiety, when she took a Xanax this did not improve her symptoms. EKG and initial troponins were unremarkable. Hospitalists were called for further evaluation.  Hospital Course:  Principal Problem:   Chest pain: EKG on admission unremarkable. Patient has only resected for smoking and family history. Enzymes x3 were normal. Given her complaints of chest pressure and sharp pain with radiation to her back her face other side of her chest and left arm, a systolic suspect that this is anxiety.  See below. On admission she was noted to have normal heart rate, respiratory rate and oxygen saturation of 100% on room air, so do not think that this is a pulmonary embolus. Her chest x-ray was unremarkable.  Active Problems:   Anxiety disorder: Patient already on when necessary Xanax. Discussion with the patient, she reports increased stressors in her life with her children who apparently has some history of drug problems.  Advised the patient that she needs to look into additional stress management therapy and she'll followup with her PCP discussed this in the next month.    Tobacco abuse : Counseled. Prescription given for nicotine patch.  Headache: Patient does have history of migraines although I suspect this particular headache may be more for nitroglycerin.  Hyperlipidemia: Patient had a fasting lipid profile done the following morning after admission. Her total cholesterol to be 197 and her LDL 131. In calculation of her total risk given new guidelines, she is only at an 8% risk even with smoking and with her LDL below 190. At this time a statin medication is not recommended for her.   Procedures:  None  Consultations:  None  Discharge Exam: Filed Vitals:   01/17/13 0417 01/17/13 0500 01/17/13 0622 01/17/13 0839  BP:   92/60   Pulse:   73 66  Temp:   98.2 F (36.8 C)   TempSrc:   Oral   Resp:   18   Height:      Weight: 64.864 kg (143 lb) 64.864 kg (143 lb)    SpO2:   99% 98%    General: Alert and oriented x3, mild distress secondary to headache Cardiovascular: Regular rate and rhythm, S1-S2 Respiratory: Clear to auscultation bilaterally Abdomen: Soft, nontender, nondistended, positive bowel sounds : extremities: No clubbing cyanosis or edema  Discharge Instructions  Discharge Orders   Future Orders Complete By Expires     Diet general  As directed     Increase activity slowly  As directed  Medication List    STOP taking these medications       SINUS PO      TAKE these medications       ALPRAZolam 1 MG tablet  Commonly known as:  XANAX  Take 1 mg by mouth 2 (two) times daily as needed. For anxiety     ibuprofen 200 MG tablet  Commonly known as:  ADVIL,MOTRIN  Take 200-800 mg by mouth 2 (two) times daily as needed. For pain     nicotine 21 mg/24hr patch  Commonly known as:  NICODERM CQ - dosed in mg/24 hours  Place 1 patch onto the skin daily.      SUMAtriptan 25 MG tablet  Commonly known as:  IMITREX  Take 1 tablet (25 mg total) by mouth every 2 (two) hours as needed for migraine.                                        Allergies  Allergen Reactions  . Oxycodone Itching  . Morphine And Related Itching and Rash       Follow-up Information   Follow up with [name], MD In 1 month.   Contact information:   [facility] [address] [place] [address] [phone]        The results of significant diagnostics from this hospitalization (including imaging, microbiology, ancillary and laboratory) are listed below for reference.    Significant Diagnostic Studies: Dg Chest 2 View  01/16/2013    IMPRESSION: No active cardiopulmonary disease.   Original Report Authenticated By: Charlett Nose, M.D.     Labs: Basic Metabolic Panel:  Recent Labs Lab 01/16/13 1957 01/17/13 0531  NA 140 141  K 3.7 3.7  CL 105 110  CO2 24 22  GLUCOSE 91 102*  BUN 11 13  CREATININE 0.99 1.02  CALCIUM 9.7 8.8   Liver Function Tests:  Recent Labs Lab 01/17/13 0531  AST 20  ALT 18  ALKPHOS 51  BILITOT 0.3  PROT 6.4  ALBUMIN 3.8   CBC:  Recent Labs Lab 01/16/13 1957 01/17/13 0531  WBC 8.7 9.0  NEUTROABS 3.7  --   HGB 13.4 11.9*  HCT 37.9 34.8*  MCV 93.3 94.8  PLT 209 199   Cardiac Enzymes:  Recent Labs Lab 01/16/13 1957 01/17/13 0531  TROPONINI <0.30 <0.30     Signed:  Virginia Rochester K  [facility] 01/17/2013, 10:39 AM

## 2013-06-30 ENCOUNTER — Other Ambulatory Visit: Payer: Self-pay

## 2013-06-30 ENCOUNTER — Encounter (HOSPITAL_COMMUNITY): Payer: Self-pay | Admitting: Emergency Medicine

## 2013-06-30 ENCOUNTER — Emergency Department (HOSPITAL_COMMUNITY)
Admission: EM | Admit: 2013-06-30 | Discharge: 2013-06-30 | Disposition: A | Discharge status: Home / Self Care | Payer: BC Managed Care – PPO | Attending: Emergency Medicine | Admitting: Emergency Medicine

## 2013-06-30 ENCOUNTER — Emergency Department (HOSPITAL_COMMUNITY): Payer: BC Managed Care – PPO

## 2013-06-30 DIAGNOSIS — Z79899 Other long term (current) drug therapy: Secondary | ICD-10-CM | POA: Insufficient documentation

## 2013-06-30 DIAGNOSIS — S0003XA Contusion of scalp, initial encounter: Secondary | ICD-10-CM | POA: Insufficient documentation

## 2013-06-30 DIAGNOSIS — Z8639 Personal history of other endocrine, nutritional and metabolic disease: Secondary | ICD-10-CM | POA: Insufficient documentation

## 2013-06-30 DIAGNOSIS — Y929 Unspecified place or not applicable: Secondary | ICD-10-CM | POA: Insufficient documentation

## 2013-06-30 DIAGNOSIS — S8990XA Unspecified injury of unspecified lower leg, initial encounter: Secondary | ICD-10-CM | POA: Insufficient documentation

## 2013-06-30 DIAGNOSIS — S99929A Unspecified injury of unspecified foot, initial encounter: Secondary | ICD-10-CM

## 2013-06-30 DIAGNOSIS — Z862 Personal history of diseases of the blood and blood-forming organs and certain disorders involving the immune mechanism: Secondary | ICD-10-CM | POA: Insufficient documentation

## 2013-06-30 DIAGNOSIS — Z8719 Personal history of other diseases of the digestive system: Secondary | ICD-10-CM | POA: Insufficient documentation

## 2013-06-30 DIAGNOSIS — S1093XA Contusion of unspecified part of neck, initial encounter: Secondary | ICD-10-CM

## 2013-06-30 DIAGNOSIS — W1809XA Striking against other object with subsequent fall, initial encounter: Secondary | ICD-10-CM | POA: Insufficient documentation

## 2013-06-30 DIAGNOSIS — Z90710 Acquired absence of both cervix and uterus: Secondary | ICD-10-CM | POA: Insufficient documentation

## 2013-06-30 DIAGNOSIS — R413 Other amnesia: Secondary | ICD-10-CM | POA: Insufficient documentation

## 2013-06-30 DIAGNOSIS — Z9089 Acquired absence of other organs: Secondary | ICD-10-CM | POA: Insufficient documentation

## 2013-06-30 DIAGNOSIS — Y939 Activity, unspecified: Secondary | ICD-10-CM | POA: Insufficient documentation

## 2013-06-30 DIAGNOSIS — G43909 Migraine, unspecified, not intractable, without status migrainosus: Secondary | ICD-10-CM | POA: Insufficient documentation

## 2013-06-30 DIAGNOSIS — S298XXA Other specified injuries of thorax, initial encounter: Secondary | ICD-10-CM | POA: Insufficient documentation

## 2013-06-30 DIAGNOSIS — S0990XA Unspecified injury of head, initial encounter: Secondary | ICD-10-CM

## 2013-06-30 DIAGNOSIS — F411 Generalized anxiety disorder: Secondary | ICD-10-CM | POA: Insufficient documentation

## 2013-06-30 DIAGNOSIS — S0083XA Contusion of other part of head, initial encounter: Secondary | ICD-10-CM

## 2013-06-30 DIAGNOSIS — F17200 Nicotine dependence, unspecified, uncomplicated: Secondary | ICD-10-CM | POA: Insufficient documentation

## 2013-06-30 DIAGNOSIS — W19XXXA Unspecified fall, initial encounter: Secondary | ICD-10-CM

## 2013-06-30 DIAGNOSIS — R110 Nausea: Secondary | ICD-10-CM | POA: Insufficient documentation

## 2013-06-30 DIAGNOSIS — F29 Unspecified psychosis not due to a substance or known physiological condition: Secondary | ICD-10-CM | POA: Insufficient documentation

## 2013-06-30 DIAGNOSIS — S3981XA Other specified injuries of abdomen, initial encounter: Secondary | ICD-10-CM | POA: Insufficient documentation

## 2013-06-30 DIAGNOSIS — S060X9A Concussion with loss of consciousness of unspecified duration, initial encounter: Secondary | ICD-10-CM | POA: Insufficient documentation

## 2013-06-30 DIAGNOSIS — S99919A Unspecified injury of unspecified ankle, initial encounter: Secondary | ICD-10-CM | POA: Insufficient documentation

## 2013-06-30 HISTORY — DX: Dizziness and giddiness: R42

## 2013-06-30 LAB — COMPREHENSIVE METABOLIC PANEL
ALT: 19 U/L (ref 0–35)
AST: 19 U/L (ref 0–37)
Albumin: 4.2 g/dL (ref 3.5–5.2)
Alkaline Phosphatase: 61 U/L (ref 39–117)
BUN: 15 mg/dL (ref 6–23)
CO2: 21 mEq/L (ref 19–32)
Calcium: 9.5 mg/dL (ref 8.4–10.5)
Chloride: 108 mEq/L (ref 96–112)
Creatinine, Ser: 0.83 mg/dL (ref 0.50–1.10)
GFR calc Af Amer: >90 mL/min (ref >90)
GFR calc non Af Amer: 87 mL/min — ABNORMAL LOW (ref >90)
Glucose, Bld: 107 mg/dL — ABNORMAL HIGH (ref 70–99)
Potassium: 4.1 mEq/L (ref 3.7–5.3)
Sodium: 144 mEq/L (ref 137–147)
Total Bilirubin: <0.2 mg/dL — ABNORMAL LOW (ref 0.3–1.2)
Total Protein: 7.1 g/dL (ref 6.0–8.3)

## 2013-06-30 LAB — CBC WITH DIFFERENTIAL/PLATELET
BASOS PCT: 1 % (ref 0–1)
Basophils Absolute: 0.1 10*3/uL (ref 0.0–0.1)
EOS ABS: 0.3 10*3/uL (ref 0.0–0.7)
Eosinophils Relative: 3 % (ref 0–5)
HEMATOCRIT: 37.3 % (ref 36.0–46.0)
HEMOGLOBIN: 13 g/dL (ref 12.0–15.0)
LYMPHS ABS: 2.5 10*3/uL (ref 0.7–4.0)
Lymphocytes Relative: 29 % (ref 12–46)
MCH: 32.1 pg (ref 26.0–34.0)
MCHC: 34.9 g/dL (ref 30.0–36.0)
MCV: 92.1 fL (ref 78.0–100.0)
MONO ABS: 0.5 10*3/uL (ref 0.1–1.0)
MONOS PCT: 6 % (ref 3–12)
NEUTROS PCT: 61 % (ref 43–77)
Neutro Abs: 5.1 10*3/uL (ref 1.7–7.7)
Platelets: 220 10*3/uL (ref 150–400)
RBC: 4.05 MIL/uL (ref 3.87–5.11)
RDW: 12.8 % (ref 11.5–15.5)
WBC: 8.4 10*3/uL (ref 4.0–10.5)

## 2013-06-30 MED ORDER — IBUPROFEN 800 MG PO TABS
800.0000 mg | ORAL_TABLET | Freq: Once | ORAL | Status: AC
  Administered 2013-06-30: 800 mg via ORAL

## 2013-06-30 MED ORDER — IBUPROFEN 800 MG PO TABS
ORAL_TABLET | ORAL | Status: AC
  Administered 2013-06-30: 800 mg via ORAL
  Filled 2013-06-30: qty 1

## 2013-06-30 NOTE — Discharge Instructions (Signed)
Rest at home this weekend and follow up with your md next week. °

## 2013-06-30 NOTE — ED Notes (Addendum)
Per husband pt fell this am and has had AMS since fall with confusion. Pt arrived alert/oriented to some.confusion noted. Husband saw pt fall face first this. Pt has been having issues with dizziness/vertigo. Pt c/o left knee. Pt has red mark and slight swelling noted to forehead. Husband told EMS that she had loc with fall.

## 2013-06-30 NOTE — ED Provider Notes (Addendum)
CSN: [id number]     Arrival date & time 06/30/13  [id number] History  This chart was scribed for Alyssa LennertJoseph L Cloma Rahrig, MD by Quintella ReichertMatthew Underwood, ED scribe.  This patient was seen in room APA11/APA11 and the patient's care was started at 8:37 AM.   Chief Complaint  Patient presents with  . Altered Mental Status    Patient is a 43 y.o. female presenting with altered mental status. The history is provided by the patient and medical records. No language interpreter was used.  Altered Mental Status Presenting symptoms: confusion and memory loss   Presenting symptoms: no combativeness and no unresponsiveness   Severity:  Mild Episode history:  Single Duration: Began this morning. Timing:  Constant Chronicity:  New Context: head injury   Associated symptoms: abdominal pain, headaches and nausea   Associated symptoms: no hallucinations, no rash and no seizures     HPI Comments: [name] is a 43 y.o. female with h/o vertigo, thyroid disease, IBS, ulcerative colitis, diverticula of intestine, and anxiety who presents to the Emergency Department complaining of a fall that occurred earlier today with possible head impact, LOC and subsequent AMS.  Per triage nurse, pt's husband told EMS that pt has been having issues recently with dizziness/vertigo, and this morning he saw her fall face-first, hit her head, and lose consciousness.  Since then he stated that she has seemed confused.  Pt does not remember the fall.  She states that she does feel slightly confused.  She also complains of nausea, headache, left knee pain, and left-sided chest pain described as "pressure" behind her left breast.  She states that she has had some stabbing right-sided abdominal pain recently as well although none currently.   Past Medical History  Diagnosis Date  . Anxiety   . IBS (irritable bowel syndrome)   . Thyroid disease   . Diverticula of intestine   . Ulcerative colitis   . Migraines   . Vertigo     Past Surgical  History  Procedure Laterality Date  . Abdominal hysterectomy    . Appendectomy    . Cholecystectomy    . Knee arthroscopy      Family History  Problem Relation Age of Onset  . Diabetes Mother   . COPD Mother   . Heart disease Mother   . Breast cancer Paternal Grandmother   . Lung cancer Paternal Grandfather     History  Substance Use Topics  . Smoking status: Current Every Day Smoker -- 1.00 packs/day for 20 years    Types: Cigarettes  . Smokeless tobacco: Never Used  . Alcohol Use: No     Comment: socially    OB History   Grav Para Term Preterm Abortions TAB SAB Ect Mult Living                  Review of Systems  Constitutional: Negative for appetite change and fatigue.  HENT: Negative for congestion, ear discharge and sinus pressure.   Eyes: Negative for discharge.  Respiratory: Negative for cough.   Cardiovascular: Positive for chest pain.  Gastrointestinal: Positive for nausea and abdominal pain.  Genitourinary: Negative for frequency and hematuria.  Musculoskeletal: Positive for arthralgias (left knee). Negative for back pain.  Skin: Negative for rash.  Neurological: Positive for dizziness and headaches. Negative for seizures.  Psychiatric/Behavioral: Positive for memory loss and confusion. Negative for hallucinations.     Allergies  Oxycodone and Morphine and related  Home Medications   Current Outpatient Rx  Name  Route  Sig  Dispense  Refill  . ALPRAZolam (XANAX) 1 MG tablet   Oral   Take 1 mg by mouth 2 (two) times daily as needed. For anxiety          . ibuprofen (ADVIL,MOTRIN) 200 MG tablet   Oral   Take 200-800 mg by mouth 2 (two) times daily as needed. For pain         . nicotine (NICODERM CQ - DOSED IN MG/24 HOURS) 21 mg/24hr patch   Transdermal   Place 1 patch onto the skin daily.   28 patch   0   . SUMAtriptan (IMITREX) 25 MG tablet   Oral   Take 1 tablet (25 mg total) by mouth every 2 (two) hours as needed for migraine.   10  tablet   0    BP 114/72  Pulse 75  Temp(Src) 97.9 F (36.6 C) (Oral)  Resp 18  SpO2 100%  Physical Exam  Nursing note and vitals reviewed. Constitutional: She is oriented to person, place, and time. She appears well-developed.  HENT:  Head: Normocephalic.  Mild bruising to left forehead  Eyes: Conjunctivae and EOM are normal. No scleral icterus.  Neck: Neck supple. No thyromegaly present.  Cardiovascular: Normal rate and regular rhythm.  Exam reveals no gallop and no friction rub.   No murmur heard. Pulmonary/Chest: No stridor. She has no wheezes. She has no rales. She exhibits no tenderness.  Abdominal: She exhibits no distension. There is no tenderness. There is no rebound.  Musculoskeletal: Normal range of motion. She exhibits tenderness. She exhibits no edema.  Tender to left chest and left knee  Lymphadenopathy:    She has no cervical adenopathy.  Neurological: She is alert and oriented to person, place, and time. She exhibits normal muscle tone. Coordination normal.  Skin: No rash noted. No erythema.  Psychiatric: She has a normal mood and affect. Her behavior is normal.    ED Course  Procedures (including critical care time)  DIAGNOSTIC STUDIES: Oxygen Saturation is 100% on room air, normal by my interpretation.    COORDINATION OF CARE:  8:40 AM-Discussed treatment plan which includes labs and imaging with pt at bedside and pt agreed to plan.   11:43 AM-Informed pt that labs and imaging are unremarkable and she appears stable for discharge.  Advised rest and PCP f/u next week.  Pt expressed understanding and agreed with plan.    Labs Review Labs Reviewed  COMPREHENSIVE METABOLIC PANEL - Abnormal; Notable for the following:    Glucose, Bld 107 (*)    Total Bilirubin <0.2 (*)    GFR calc non Af Amer 87 (*)    All other components within normal limits  CBC WITH DIFFERENTIAL     Imaging Review Dg Chest 2 View  06/30/2013   CLINICAL DATA:  Syncope this  morning  EXAM: CHEST  2 VIEW  COMPARISON:  01/16/2013  FINDINGS: Normal heart size, mediastinal contours, and pulmonary vascularity.  Minimal chronic peribronchial thickening.  No acute infiltrate, pleural effusion or pneumothorax.  Bones unremarkable.  IMPRESSION: No acute abnormalities.   Electronically Signed   By: Ulyses Southward M.D.   On: 06/30/2013 09:26   Ct Head Wo Contrast  06/30/2013   CLINICAL DATA:  Fall today, loss of consciousness  EXAM: CT HEAD WITHOUT CONTRAST  CT CERVICAL SPINE WITHOUT CONTRAST  TECHNIQUE: Multidetector CT imaging of the head and cervical spine was performed following the standard protocol without intravenous contrast. Multiplanar CT image reconstructions  of the cervical spine were also generated.  COMPARISON:  Prior CT scan of the head 08/15/2009  FINDINGS: CT HEAD FINDINGS  Negative for acute intracranial hemorrhage, acute infarction, mass, mass effect, hydrocephalus or midline shift. Gray-white differentiation is preserved throughout. No soft tissue or calvarial abnormality. Normal aeration of the mastoid air cells and paranasal sinuses. Globes are intact and symmetric bilaterally.  CT CERVICAL SPINE FINDINGS  No acute fracture, malalignment or prevertebral soft tissue swelling. Very mild degenerative changes at the atlantodental interval. No significant spondylosis. Unremarkable CT appearance of the thyroid gland. No acute soft tissue abnormality. The lung apices are unremarkable.  IMPRESSION: CT HEAD  Negative.  CT C-SPINE  No acute fracture or malalignment.   Electronically Signed   By: Malachy Moan M.D.   On: 06/30/2013 09:46   Ct Cervical Spine Wo Contrast  06/30/2013   CLINICAL DATA:  Fall today, loss of consciousness  EXAM: CT HEAD WITHOUT CONTRAST  CT CERVICAL SPINE WITHOUT CONTRAST  TECHNIQUE: Multidetector CT imaging of the head and cervical spine was performed following the standard protocol without intravenous contrast. Multiplanar CT image reconstructions of  the cervical spine were also generated.  COMPARISON:  Prior CT scan of the head 08/15/2009  FINDINGS: CT HEAD FINDINGS  Negative for acute intracranial hemorrhage, acute infarction, mass, mass effect, hydrocephalus or midline shift. Gray-white differentiation is preserved throughout. No soft tissue or calvarial abnormality. Normal aeration of the mastoid air cells and paranasal sinuses. Globes are intact and symmetric bilaterally.  CT CERVICAL SPINE FINDINGS  No acute fracture, malalignment or prevertebral soft tissue swelling. Very mild degenerative changes at the atlantodental interval. No significant spondylosis. Unremarkable CT appearance of the thyroid gland. No acute soft tissue abnormality. The lung apices are unremarkable.  IMPRESSION: CT HEAD  Negative.  CT C-SPINE  No acute fracture or malalignment.   Electronically Signed   By: Malachy Moan M.D.   On: 06/30/2013 09:46   Dg Knee Complete 4 Views Left  06/30/2013   CLINICAL DATA:  Syncope, left knee pain medially  EXAM: LEFT KNEE - COMPLETE 4+ VIEW  COMPARISON:  None  FINDINGS: Osseous mineralization normal.  Medial compartment joint space narrowing.  No acute fracture, dislocation or bone destruction.  No knee joint effusion.  IMPRESSION: Degenerative changes at medial compartment left knee.  No acute abnormalities.   Electronically Signed   By: Ulyses Southward M.D.   On: 06/30/2013 09:26    Date: 06/30/2013  Rate: 74  Rhythm: normal sinus rhythm  QRS Axis: normal  Intervals: normal  ST/T Wave abnormalities: normal  Conduction Disutrbances:none  Narrative Interpretation:   Old EKG Reviewed: none available   EKG Interpretation   None       MDM  Head injury,  Will follow up with pcp   Alyssa Lennert, MD 06/30/13 1153                           The chart was scribed for me under my direct supervision.  I personally performed the history, physical, and medical decision making and all procedures in the evaluation of this  patient.Alyssa Lennert, MD 06/30/13 1154

## 2013-11-25 ENCOUNTER — Emergency Department (HOSPITAL_COMMUNITY): Payer: BC Managed Care – PPO

## 2013-11-25 ENCOUNTER — Emergency Department (HOSPITAL_COMMUNITY)
Admission: EM | Admit: 2013-11-25 | Discharge: 2013-11-25 | Disposition: A | Discharge status: Home / Self Care | Payer: BC Managed Care – PPO | Attending: Emergency Medicine | Admitting: Emergency Medicine

## 2013-11-25 ENCOUNTER — Encounter (HOSPITAL_COMMUNITY): Payer: Self-pay | Admitting: Emergency Medicine

## 2013-11-25 DIAGNOSIS — G43909 Migraine, unspecified, not intractable, without status migrainosus: Secondary | ICD-10-CM | POA: Insufficient documentation

## 2013-11-25 DIAGNOSIS — Z8719 Personal history of other diseases of the digestive system: Secondary | ICD-10-CM | POA: Insufficient documentation

## 2013-11-25 DIAGNOSIS — Z7982 Long term (current) use of aspirin: Secondary | ICD-10-CM | POA: Insufficient documentation

## 2013-11-25 DIAGNOSIS — R071 Chest pain on breathing: Secondary | ICD-10-CM | POA: Insufficient documentation

## 2013-11-25 DIAGNOSIS — F17200 Nicotine dependence, unspecified, uncomplicated: Secondary | ICD-10-CM | POA: Insufficient documentation

## 2013-11-25 DIAGNOSIS — J209 Acute bronchitis, unspecified: Secondary | ICD-10-CM | POA: Insufficient documentation

## 2013-11-25 DIAGNOSIS — Z79899 Other long term (current) drug therapy: Secondary | ICD-10-CM | POA: Insufficient documentation

## 2013-11-25 DIAGNOSIS — IMO0002 Reserved for concepts with insufficient information to code with codable children: Secondary | ICD-10-CM | POA: Insufficient documentation

## 2013-11-25 DIAGNOSIS — H9210 Otorrhea, unspecified ear: Secondary | ICD-10-CM | POA: Insufficient documentation

## 2013-11-25 DIAGNOSIS — Z791 Long term (current) use of non-steroidal anti-inflammatories (NSAID): Secondary | ICD-10-CM | POA: Insufficient documentation

## 2013-11-25 DIAGNOSIS — R0789 Other chest pain: Secondary | ICD-10-CM

## 2013-11-25 DIAGNOSIS — F411 Generalized anxiety disorder: Secondary | ICD-10-CM | POA: Insufficient documentation

## 2013-11-25 DIAGNOSIS — E079 Disorder of thyroid, unspecified: Secondary | ICD-10-CM | POA: Insufficient documentation

## 2013-11-25 DIAGNOSIS — E876 Hypokalemia: Secondary | ICD-10-CM | POA: Insufficient documentation

## 2013-11-25 DIAGNOSIS — R61 Generalized hyperhidrosis: Secondary | ICD-10-CM | POA: Insufficient documentation

## 2013-11-25 DIAGNOSIS — Z792 Long term (current) use of antibiotics: Secondary | ICD-10-CM | POA: Insufficient documentation

## 2013-11-25 LAB — D-DIMER, QUANTITATIVE: D-Dimer, Quant: 0.32 ug/mL-FEU (ref 0.00–0.48)

## 2013-11-25 LAB — CBC WITH DIFFERENTIAL/PLATELET
Basophils Absolute: 0 10*3/uL (ref 0.0–0.1)
Basophils Relative: 0 % (ref 0–1)
Eosinophils Absolute: 0.3 10*3/uL (ref 0.0–0.7)
Eosinophils Relative: 3 % (ref 0–5)
HEMATOCRIT: 35.1 % — AB (ref 36.0–46.0)
HEMOGLOBIN: 12.4 g/dL (ref 12.0–15.0)
LYMPHS PCT: 33 % (ref 12–46)
Lymphs Abs: 3.2 10*3/uL (ref 0.7–4.0)
MCH: 32.4 pg (ref 26.0–34.0)
MCHC: 35.3 g/dL (ref 30.0–36.0)
MCV: 91.6 fL (ref 78.0–100.0)
MONOS PCT: 5 % (ref 3–12)
Monocytes Absolute: 0.5 10*3/uL (ref 0.1–1.0)
Neutro Abs: 5.7 10*3/uL (ref 1.7–7.7)
Neutrophils Relative %: 59 % (ref 43–77)
PLATELETS: 203 10*3/uL (ref 150–400)
RBC: 3.83 MIL/uL — AB (ref 3.87–5.11)
RDW: 13.2 % (ref 11.5–15.5)
WBC: 9.7 10*3/uL (ref 4.0–10.5)

## 2013-11-25 LAB — LIPASE, BLOOD: Lipase: 26 U/L (ref 11–59)

## 2013-11-25 LAB — COMPREHENSIVE METABOLIC PANEL
ALK PHOS: 80 U/L (ref 39–117)
ALT: 25 U/L (ref 0–35)
AST: 24 U/L (ref 0–37)
Albumin: 4.2 g/dL (ref 3.5–5.2)
BUN: 18 mg/dL (ref 6–23)
CO2: 19 mEq/L (ref 19–32)
Calcium: 9.7 mg/dL (ref 8.4–10.5)
Chloride: 100 mEq/L (ref 96–112)
Creatinine, Ser: 0.87 mg/dL (ref 0.50–1.10)
GFR calc non Af Amer: 82 mL/min — ABNORMAL LOW (ref >90)
Glucose, Bld: 105 mg/dL — ABNORMAL HIGH (ref 70–99)
POTASSIUM: 3.2 meq/L — AB (ref 3.7–5.3)
SODIUM: 139 meq/L (ref 137–147)
Total Bilirubin: 0.2 mg/dL — ABNORMAL LOW (ref 0.3–1.2)
Total Protein: 7.3 g/dL (ref 6.0–8.3)

## 2013-11-25 LAB — TROPONIN I: Troponin I: <0.3 ng/mL (ref <0.30)

## 2013-11-25 MED ORDER — CYCLOBENZAPRINE HCL 10 MG PO TABS: 10.0000 mg | ORAL_TABLET | Freq: Three times a day (TID) | ORAL | Status: DC | PRN

## 2013-11-25 MED ORDER — AEROCHAMBER Z-STAT PLUS/MEDIUM MISC
1.0000 | Freq: Once | Status: AC
  Administered 2013-11-25: 1

## 2013-11-25 MED ORDER — DM-GUAIFENESIN ER 30-600 MG PO TB12
1.0000 | ORAL_TABLET | Freq: Once | ORAL | Status: AC
  Administered 2013-11-25: 1 via ORAL
  Filled 2013-11-25: qty 1

## 2013-11-25 MED ORDER — LORAZEPAM 1 MG PO TABS
0.5000 mg | ORAL_TABLET | Freq: Once | ORAL | Status: AC
  Administered 2013-11-25: 0.5 mg via ORAL
  Filled 2013-11-25: qty 1

## 2013-11-25 MED ORDER — SULFAMETHOXAZOLE-TMP DS 800-160 MG PO TABS
1.0000 | ORAL_TABLET | Freq: Once | ORAL | Status: AC
  Administered 2013-11-25: 1 via ORAL
  Filled 2013-11-25: qty 1

## 2013-11-25 MED ORDER — CYCLOBENZAPRINE HCL 10 MG PO TABS
10.0000 mg | ORAL_TABLET | Freq: Once | ORAL | Status: AC
  Administered 2013-11-25: 10 mg via ORAL
  Filled 2013-11-25: qty 1

## 2013-11-25 MED ORDER — POTASSIUM CHLORIDE CRYS ER 20 MEQ PO TBCR
40.0000 meq | EXTENDED_RELEASE_TABLET | Freq: Once | ORAL | Status: AC
  Administered 2013-11-25: 40 meq via ORAL
  Filled 2013-11-25: qty 2

## 2013-11-25 MED ORDER — PREDNISONE 50 MG PO TABS
60.0000 mg | ORAL_TABLET | Freq: Once | ORAL | Status: AC
  Administered 2013-11-25: 60 mg via ORAL
  Filled 2013-11-25 (×2): qty 1

## 2013-11-25 MED ORDER — GI COCKTAIL ~~LOC~~
30.0000 mL | Freq: Once | ORAL | Status: AC
  Administered 2013-11-25: 30 mL via ORAL
  Filled 2013-11-25: qty 30

## 2013-11-25 MED ORDER — SULFAMETHOXAZOLE-TRIMETHOPRIM 800-160 MG PO TABS: 1.0000 | ORAL_TABLET | Freq: Two times a day (BID) | ORAL | Status: DC

## 2013-11-25 MED ORDER — POTASSIUM CHLORIDE CRYS ER 20 MEQ PO TBCR: 20.0000 meq | EXTENDED_RELEASE_TABLET | Freq: Two times a day (BID) | ORAL | Status: DC

## 2013-11-25 MED ORDER — DM-GUAIFENESIN ER 30-600 MG PO TB12: 1.0000 | ORAL_TABLET | Freq: Two times a day (BID) | ORAL | Status: DC | PRN

## 2013-11-25 MED ORDER — IPRATROPIUM BROMIDE 0.02 % IN SOLN
0.5000 mg | Freq: Once | RESPIRATORY_TRACT | Status: AC
  Administered 2013-11-25: 0.5 mg via RESPIRATORY_TRACT
  Filled 2013-11-25: qty 2.5

## 2013-11-25 MED ORDER — ALBUTEROL SULFATE HFA 108 (90 BASE) MCG/ACT IN AERS: 2.0000 | INHALATION_SPRAY | RESPIRATORY_TRACT | Status: DC | PRN

## 2013-11-25 MED ORDER — ALBUTEROL (5 MG/ML) CONTINUOUS INHALATION SOLN
15.0000 mg/h | INHALATION_SOLUTION | Freq: Once | RESPIRATORY_TRACT | Status: AC
  Administered 2013-11-25: 15 mg/h via RESPIRATORY_TRACT
  Filled 2013-11-25: qty 20

## 2013-11-25 MED ORDER — FAMOTIDINE 20 MG PO TABS
20.0000 mg | ORAL_TABLET | Freq: Once | ORAL | Status: AC
  Administered 2013-11-25: 20 mg via ORAL
  Filled 2013-11-25: qty 1

## 2013-11-25 MED ORDER — PREDNISONE 20 MG PO TABS: ORAL_TABLET | ORAL | Status: DC

## 2013-11-25 MED ORDER — KETOROLAC TROMETHAMINE 60 MG/2ML IM SOLN
60.0000 mg | Freq: Once | INTRAMUSCULAR | Status: AC
  Administered 2013-11-25: 60 mg via INTRAMUSCULAR
  Filled 2013-11-25: qty 2

## 2013-11-25 NOTE — Discharge Instructions (Signed)
Drink plenty of fluids. Take the medications as written and use the inhaler for wheezing and shortness of breath.  Recheck if you seem worse such as getting a high fever, struggling to breathe, have uncontrolled vomiting.

## 2013-11-25 NOTE — ED Notes (Signed)
Pt maintained O2 saturation of 98%-100% during the duration of ambulation.

## 2013-11-25 NOTE — Progress Notes (Signed)
Patient instructed on how to use Aerochamber with inhaler.

## 2013-11-25 NOTE — ED Provider Notes (Signed)
CSN: [id number]     Arrival date & time 11/25/13  1259 History   First MD Initiated Contact with Patient 11/25/13 1305  This chart was scribed for Ward Givens, MD by Valera Castle, ED Scribe. This patient was seen in room APA01/APA01 and the patient's care was started at 1:22 PM.    Chief Complaint  Patient presents with  . Chest Pain    (Consider location/radiation/quality/duration/timing/severity/associated sxs/prior Treatment) The history is provided by the patient and a parent. No language interpreter was used.   HPI Comments: [name] is a 43 y.o. female with h/o anxiety and panic attacks who presents to the Emergency Department complaining of SOB and intermittent, stabbing, central chest pain that radiates to her left side, onset earlier this afternoon while walking around shopping at [place]. She states her episodes of chest pain are intermittent will last several seconds. She denies being stressed out or anxious about anything at the time of onset earlier today. But she states she wasn't sure if she was having a panic attack or not. Her chest pain is pleuritic and hurts worse when she read deeply. She has had some nausea and sweating. However she thinks it may be from her panic attacks.  She reports she has had "heartburn" and indicates discomfort in her epigastric pressure "like an elephant sitting on me"  the last couple of days. She denies any burning sensation or burning reflux of fluid into her throat. She states usually she is able to take her Xanax and prilosec with relief, but has not had relief recently. She denies abdominal tightness today, stating she felt better after waking up. She reports associated congestion and cough, onset 1 week ago. She denies her cough being productive. She has associated wheezing, rhinorrhea, with green/clear discharge, and ear pressure. She states her right ear was bleeding 5 days ago. She states she has h/o TMJ with pain in her right ear.   She  has h/o inhaler use, but denies having tried her inhaler for her recent symptoms. She denies associated  LE swelling.. She states her mother has h/o CHF. Her mother states she has h/o leaking heart valves. Pt's maternal grandmother had h/o aortic valve replacement, and her maternal grandfather had heart attack.  PCP - [name], MD  Past Medical History  Diagnosis Date  . Anxiety   . IBS (irritable bowel syndrome)   . Thyroid disease   . Diverticula of intestine   . Ulcerative colitis   . Migraines   . Vertigo    Past Surgical History  Procedure Laterality Date  . Abdominal hysterectomy    . Appendectomy    . Cholecystectomy    . Knee arthroscopy     Family History  Problem Relation Age of Onset  . Diabetes Mother   . COPD Mother   . Heart disease Mother   . Breast cancer Paternal Grandmother   . Lung cancer Paternal Grandfather    History  Substance Use Topics  . Smoking status: Current Every Day Smoker -- 1.00 packs/day for 20 years    Types: Cigarettes  . Smokeless tobacco: Never Used  . Alcohol Use: No     Comment: socially   Unemployed server for a few months  Pt smokes 1 PPD.  She denies EtOH use.  OB History   Grav Para Term Preterm Abortions TAB SAB Ect Mult Living                 Review of Systems  Constitutional: Positive for diaphoresis. Negative for fever.  HENT: Positive for congestion, ear discharge (blood from right ear) and rhinorrhea.   Respiratory: Positive for cough, chest tightness, shortness of breath and wheezing.   Cardiovascular: Positive for chest pain. Negative for leg swelling.  Gastrointestinal: Positive for nausea. Negative for vomiting and diarrhea.  Psychiatric/Behavioral: The patient is not nervous/anxious.   All other systems reviewed and are negative.  Allergies  Morphine and related  Home Medications   Prior to Admission medications   Medication Sig Start Date End Date Taking? Authorizing Provider  ALPRAZolam Prudy Feeler) 1  MG tablet Take 1 mg by mouth 2 (two) times daily as needed. For anxiety    Yes Historical Provider, MD  aspirin EC 81 MG tablet Take 324 mg by mouth once.    Yes Historical Provider, MD  ibuprofen (ADVIL,MOTRIN) 200 MG tablet Take 600 mg by mouth 2 (two) times daily as needed for mild pain or moderate pain. For pain   Yes Historical Provider, MD  levothyroxine (SYNTHROID, LEVOTHROID) 125 MCG tablet Take 1 tablet by mouth daily. 06/19/13  Yes Historical Provider, MD  omeprazole (PRILOSEC) 20 MG capsule Take 1 capsule by mouth daily. 06/19/13  Yes Historical Provider, MD  sertraline (ZOLOFT) 100 MG tablet Take 150 mg by mouth daily. 06/19/13  Yes Historical Provider, MD  venlafaxine (EFFEXOR) 37.5 MG tablet Take 37.5 mg by mouth 2 (two) times daily.   Yes Historical Provider, MD  albuterol (PROVENTIL HFA;VENTOLIN HFA) 108 (90 BASE) MCG/ACT inhaler Inhale 2 puffs into the lungs every 4 (four) hours as needed for wheezing or shortness of breath. 11/25/13   Ward Givens, MD  cyclobenzaprine (FLEXERIL) 10 MG tablet Take 1 tablet (10 mg total) by mouth 3 (three) times daily as needed for muscle spasms. 11/25/13   Ward Givens, MD  dextromethorphan-guaiFENesin (MUCINEX DM) 30-600 MG per 12 hr tablet Take 1 tablet by mouth 2 (two) times daily as needed for cough. 11/25/13   Ward Givens, MD  potassium chloride SA (K-DUR,KLOR-CON) 20 MEQ tablet Take 1 tablet (20 mEq total) by mouth 2 (two) times daily. 11/25/13   Ward Givens, MD  predniSONE (DELTASONE) 20 MG tablet Take 3 po QD x 2d starting tomorrow, then 2 po QD x 3d then 1 po QD x 3d 11/25/13   Ward Givens, MD  sulfamethoxazole-trimethoprim (SEPTRA DS) 800-160 MG per tablet Take 1 tablet by mouth every 12 (twelve) hours. 11/25/13   Ward Givens, MD   BP 131/94  Pulse 99  Temp(Src) 98.9 F (37.2 C) (Oral)  Resp 22  Ht 5\' 7"  (1.702 m)  Wt 153 lb (69.4 kg)  BMI 23.96 kg/m2  SpO2 100%  Vital signs normal   Physical Exam  Nursing note and vitals  reviewed. Constitutional: She is oriented to person, place, and time. She appears well-developed and well-nourished.  Non-toxic appearance. She does not appear ill. No distress.  HENT:  Head: Normocephalic and atraumatic.  Right Ear: External ear normal.  Left Ear: External ear normal.  Nose: Nose normal. No mucosal edema or rhinorrhea.  Mouth/Throat: Oropharynx is clear and moist and mucous membranes are normal. No dental abscesses or uvula swelling.  Eyes: Conjunctivae and EOM are normal. Pupils are equal, round, and reactive to light.  Neck: Normal range of motion and full passive range of motion without pain. Neck supple.  Cardiovascular: Normal rate, regular rhythm and normal heart sounds.  Exam reveals no gallop and no friction rub.  No murmur heard. Pulmonary/Chest: Accessory muscle usage present. Tachypnea noted. No respiratory distress. She has decreased breath sounds. She has no wheezes. She has no rhonchi. She has no rales. She exhibits tenderness. She exhibits no crepitus.    Frequent tight coughing. Tender costal condial bilaterally and left lower anterior chest wall.  Abdominal: Soft. Normal appearance and bowel sounds are normal. She exhibits no distension. There is tenderness. There is no rebound and no guarding.  Tender above umbilicus.  Musculoskeletal: Normal range of motion. She exhibits no edema and no tenderness.  Moves all extremities well.   Neurological: She is alert and oriented to person, place, and time. She has normal strength. No cranial nerve deficit.  Skin: Skin is warm, dry and intact. No rash noted. No erythema. No pallor.  Psychiatric: She has a normal mood and affect. Her speech is normal and behavior is normal. Her mood appears not anxious.   ED Course  Procedures (including critical care time)  Medications  albuterol (PROVENTIL,VENTOLIN) solution continuous neb (15 mg/hr Nebulization Given 11/25/13 1345)  ipratropium (ATROVENT) nebulizer solution 0.5  mg (0.5 mg Nebulization Given 11/25/13 1345)  predniSONE (DELTASONE) tablet 60 mg (60 mg Oral Given 11/25/13 1351)  LORazepam (ATIVAN) tablet 0.5 mg (0.5 mg Oral Given 11/25/13 1351)  famotidine (PEPCID) tablet 20 mg (20 mg Oral Given 11/25/13 1351)  gi cocktail (Maalox,Lidocaine,Donnatal) (30 mLs Oral Given 11/25/13 1350)  potassium chloride SA (K-DUR,KLOR-CON) CR tablet 40 mEq (40 mEq Oral Given 11/25/13 1527)  ketorolac (TORADOL) injection 60 mg (60 mg Intramuscular Given 11/25/13 1540)  cyclobenzaprine (FLEXERIL) tablet 10 mg (10 mg Oral Given 11/25/13 1539)  dextromethorphan-guaiFENesin (MUCINEX DM) 30-600 MG per 12 hr tablet 1 tablet (1 tablet Oral Given 11/25/13 1630)  sulfamethoxazole-trimethoprim (BACTRIM DS) 800-160 MG per tablet 1 tablet (1 tablet Oral Given 11/25/13 1630)  aerochamber Z-Stat Plus/medium 1 each (1 each Other Given 11/25/13 1757)   DIAGNOSTIC STUDIES: Oxygen Saturation is 100% on room air, normal by my interpretation.    COORDINATION OF CARE: 1:33 PM-Discussed treatment plan with pt at bedside and pt agreed to plan.   3:32 PM- Pt reports that she is still short of breath. Coughed up almost to the point of vomiting. Lungs clear tachypneic still. Patient was given medications for chest wall pain, her hypokalemia was also treated. We went over her test results.  Patient ambulated by nursing staff for her pulse ox remained over 98%.  Recheck at 1620 lungs are clear. Patient is feeling better. Her pain is improved. She feels ready to go home.   Lab results Results for orders placed during the hospital encounter of 11/25/13  CBC WITH DIFFERENTIAL      Result Value Ref Range   WBC 9.7  4.0 - 10.5 K/uL   RBC 3.83 (*) 3.87 - 5.11 MIL/uL   Hemoglobin 12.4  12.0 - 15.0 g/dL   HCT 09.8 (*) 11.9 - 14.7 %   MCV 91.6  78.0 - 100.0 fL   MCH 32.4  26.0 - 34.0 pg   MCHC 35.3  30.0 - 36.0 g/dL   RDW 82.9  56.2 - 13.0 %   Platelets 203  150 - 400 K/uL   Neutrophils Relative % 59  43 - 77 %    Neutro Abs 5.7  1.7 - 7.7 K/uL   Lymphocytes Relative 33  12 - 46 %   Lymphs Abs 3.2  0.7 - 4.0 K/uL   Monocytes Relative 5  3 - 12 %  Monocytes Absolute 0.5  0.1 - 1.0 K/uL   Eosinophils Relative 3  0 - 5 %   Eosinophils Absolute 0.3  0.0 - 0.7 K/uL   Basophils Relative 0  0 - 1 %   Basophils Absolute 0.0  0.0 - 0.1 K/uL  COMPREHENSIVE METABOLIC PANEL      Result Value Ref Range   Sodium 139  137 - 147 mEq/L   Potassium 3.2 (*) 3.7 - 5.3 mEq/L   Chloride 100  96 - 112 mEq/L   CO2 19  19 - 32 mEq/L   Glucose, Bld 105 (*) 70 - 99 mg/dL   BUN 18  6 - 23 mg/dL   Creatinine, Ser 1.61  0.50 - 1.10 mg/dL   Calcium 9.7  8.4 - 09.6 mg/dL   Total Protein 7.3  6.0 - 8.3 g/dL   Albumin 4.2  3.5 - 5.2 g/dL   AST 24  0 - 37 U/L   ALT 25  0 - 35 U/L   Alkaline Phosphatase 80  39 - 117 U/L   Total Bilirubin 0.2 (*) 0.3 - 1.2 mg/dL   GFR calc non Af Amer 82 (*) >90 mL/min   GFR calc Af Amer >90  >90 mL/min  TROPONIN I      Result Value Ref Range   Troponin I <0.30  <0.30 ng/mL  LIPASE, BLOOD      Result Value Ref Range   Lipase 26  11 - 59 U/L  D-DIMER, QUANTITATIVE      Result Value Ref Range   D-Dimer, Quant 0.32  0.00 - 0.48 ug/mL-FEU   Imaging Review Dg Chest 2 View  11/25/2013   CLINICAL DATA:  Cough, shortness of breath and chest pain.  EXAM: CHEST  2 VIEW  COMPARISON:  06/30/2013  FINDINGS: The heart size and mediastinal contours are within normal limits. Both lungs are clear. The visualized skeletal structures are unremarkable.  IMPRESSION: No active cardiopulmonary disease.   Electronically Signed   By: Elberta Fortis M.D.   On: 11/25/2013 14:08    EKG Interpretation None       Date: 11/25/2013  Rate: 90  Rhythm: normal sinus rhythm  QRS Axis: normal  Intervals: QT prolonged  ST/T Wave abnormalities: normal  Conduction Disutrbances:none  Narrative Interpretation:   Old EKG Reviewed: none available   MDM   Final diagnoses:  Bronchitis with bronchospasm  Chest wall  pain  Hypokalemia    Discharge Medication List as of 11/25/2013  5:33 PM    START taking these medications   Details  albuterol (PROVENTIL HFA;VENTOLIN HFA) 108 (90 BASE) MCG/ACT inhaler Inhale 2 puffs into the lungs every 4 (four) hours as needed for wheezing or shortness of breath., Starting 11/25/2013, Until Discontinued, Print    cyclobenzaprine (FLEXERIL) 10 MG tablet Take 1 tablet (10 mg total) by mouth 3 (three) times daily as needed for muscle spasms., Starting 11/25/2013, Until Discontinued, Print    dextromethorphan-guaiFENesin (MUCINEX DM) 30-600 MG per 12 hr tablet Take 1 tablet by mouth 2 (two) times daily as needed for cough., Starting 11/25/2013, Until Discontinued, Print    potassium chloride SA (K-DUR,KLOR-CON) 20 MEQ tablet Take 1 tablet (20 mEq total) by mouth 2 (two) times daily., Starting 11/25/2013, Until Discontinued, Print    predniSONE (DELTASONE) 20 MG tablet Take 3 po QD x 2d starting tomorrow, then 2 po QD x 3d then 1 po QD x 3d, Print    sulfamethoxazole-trimethoprim (SEPTRA DS) 800-160 MG per tablet Take  1 tablet by mouth every 12 (twelve) hours., Starting 11/25/2013, Until Discontinued, Print        Plan discharge   Devoria AlbeIva Argusta Mcgann, MD, FACEP  I personally performed the services described in this documentation, which was scribed in my presence. The recorded information has been reviewed and considered.  Devoria AlbeIva Asuna Peth, MD, Armando GangFACEP    Ward GivensIva L Arynn Armand, MD 11/25/13 2119

## 2013-11-25 NOTE — ED Notes (Signed)
Complain of chest pain that started about thirty min ago. Pt states she has had a cough and congestion. States she took baby asa 324 prior to arrival

## 2014-01-23 ENCOUNTER — Ambulatory Visit (HOSPITAL_COMMUNITY)
Admission: RE | Admit: 2014-01-23 | Payer: BC Managed Care – PPO | Source: Ambulatory Visit | Admitting: Physical Therapy

## 2015-02-28 ENCOUNTER — Encounter (HOSPITAL_COMMUNITY): Payer: Self-pay | Admitting: Emergency Medicine

## 2015-02-28 ENCOUNTER — Emergency Department (HOSPITAL_COMMUNITY): Payer: BLUE CROSS/BLUE SHIELD

## 2015-02-28 ENCOUNTER — Emergency Department (HOSPITAL_COMMUNITY)
Admission: EM | Admit: 2015-02-28 | Discharge: 2015-03-01 | Disposition: A | Discharge status: Home / Self Care | Payer: BLUE CROSS/BLUE SHIELD | Attending: Emergency Medicine | Admitting: Emergency Medicine

## 2015-02-28 DIAGNOSIS — E876 Hypokalemia: Secondary | ICD-10-CM

## 2015-02-28 DIAGNOSIS — Z79899 Other long term (current) drug therapy: Secondary | ICD-10-CM | POA: Insufficient documentation

## 2015-02-28 DIAGNOSIS — R1013 Epigastric pain: Secondary | ICD-10-CM | POA: Insufficient documentation

## 2015-02-28 DIAGNOSIS — R51 Headache: Secondary | ICD-10-CM | POA: Diagnosis not present

## 2015-02-28 DIAGNOSIS — R110 Nausea: Secondary | ICD-10-CM | POA: Diagnosis not present

## 2015-02-28 DIAGNOSIS — Z8719 Personal history of other diseases of the digestive system: Secondary | ICD-10-CM | POA: Insufficient documentation

## 2015-02-28 DIAGNOSIS — R1011 Right upper quadrant pain: Secondary | ICD-10-CM | POA: Diagnosis not present

## 2015-02-28 DIAGNOSIS — Z720 Tobacco use: Secondary | ICD-10-CM | POA: Insufficient documentation

## 2015-02-28 DIAGNOSIS — Z7982 Long term (current) use of aspirin: Secondary | ICD-10-CM | POA: Insufficient documentation

## 2015-02-28 DIAGNOSIS — F419 Anxiety disorder, unspecified: Secondary | ICD-10-CM | POA: Insufficient documentation

## 2015-02-28 DIAGNOSIS — E039 Hypothyroidism, unspecified: Secondary | ICD-10-CM | POA: Insufficient documentation

## 2015-02-28 DIAGNOSIS — R109 Unspecified abdominal pain: Secondary | ICD-10-CM | POA: Diagnosis present

## 2015-02-28 LAB — CBC WITH DIFFERENTIAL/PLATELET
BASOS PCT: 1 % (ref 0–1)
Basophils Absolute: 0.1 10*3/uL (ref 0.0–0.1)
Eosinophils Absolute: 0.5 10*3/uL (ref 0.0–0.7)
Eosinophils Relative: 4 % (ref 0–5)
HCT: 41 % (ref 36.0–46.0)
Hemoglobin: 14.4 g/dL (ref 12.0–15.0)
LYMPHS PCT: 31 % (ref 12–46)
Lymphs Abs: 3.2 10*3/uL (ref 0.7–4.0)
MCH: 32 pg (ref 26.0–34.0)
MCHC: 35.1 g/dL (ref 30.0–36.0)
MCV: 91.1 fL (ref 78.0–100.0)
Monocytes Absolute: 0.4 10*3/uL (ref 0.1–1.0)
Monocytes Relative: 4 % (ref 3–12)
NEUTROS ABS: 6.3 10*3/uL (ref 1.7–7.7)
Neutrophils Relative %: 60 % (ref 43–77)
PLATELETS: 213 10*3/uL (ref 150–400)
RBC: 4.5 MIL/uL (ref 3.87–5.11)
RDW: 12.5 % (ref 11.5–15.5)
WBC: 10.4 10*3/uL (ref 4.0–10.5)

## 2015-02-28 MED ORDER — PROCHLORPERAZINE EDISYLATE 5 MG/ML IJ SOLN
10.0000 mg | Freq: Four times a day (QID) | INTRAMUSCULAR | Status: DC | PRN
  Administered 2015-02-28: 10 mg via INTRAVENOUS
  Filled 2015-02-28: qty 2

## 2015-02-28 MED ORDER — IOHEXOL 300 MG/ML  SOLN
50.0000 mL | Freq: Once | INTRAMUSCULAR | Status: AC | PRN
  Administered 2015-02-28: 50 mL via ORAL

## 2015-02-28 MED ORDER — FENTANYL CITRATE (PF) 100 MCG/2ML IJ SOLN
50.0000 ug | Freq: Once | INTRAMUSCULAR | Status: AC
  Administered 2015-02-28: 50 ug via INTRAVENOUS
  Filled 2015-02-28: qty 2

## 2015-02-28 NOTE — ED Provider Notes (Signed)
CSN: [id number]     Arrival date & time 02/28/15  2244 History   None    Chief Complaint  Patient presents with  . Abdominal Pain     (Consider location/radiation/quality/duration/timing/severity/associated sxs/prior Treatment) Patient is a 44 y.o. female presenting with abdominal pain. The history is provided by the patient.  Abdominal Pain Pain location:  RUQ Pain quality: aching and throbbing   Pain radiates to:  Does not radiate Pain severity:  Moderate Onset quality:  Sudden Duration:  6 hours Timing:  Intermittent Progression:  Worsening Chronicity:  New Context: not alcohol use, not diet changes, not recent illness, not recent travel and not suspicious food intake   Relieved by:  Nothing Exacerbated by: lying down. Ineffective treatments:  None tried Associated symptoms: nausea   Associated symptoms: no belching, no constipation, no diarrhea, no fever, no hematochezia, no hematuria and no vomiting   Risk factors: no alcohol abuse and no recent hospitalization     Past Medical History  Diagnosis Date  . Anxiety   . IBS (irritable bowel syndrome)   . Thyroid disease   . Diverticula of intestine   . Ulcerative colitis   . Migraines   . Vertigo    Past Surgical History  Procedure Laterality Date  . Abdominal hysterectomy    . Appendectomy    . Cholecystectomy    . Knee arthroscopy     Family History  Problem Relation Age of Onset  . Diabetes Mother   . COPD Mother   . Heart disease Mother   . Breast cancer Paternal Grandmother   . Lung cancer Paternal Grandfather    Social History  Substance Use Topics  . Smoking status: Current Every Day Smoker -- 1.00 packs/day for 20 years    Types: Cigarettes  . Smokeless tobacco: Never Used  . Alcohol Use: No     Comment: socially   OB History    No data available     Review of Systems  Constitutional: Negative for fever.  Gastrointestinal: Positive for nausea and abdominal pain. Negative for vomiting,  diarrhea, constipation and hematochezia.  Genitourinary: Negative for hematuria.  Neurological: Positive for headaches.  Psychiatric/Behavioral: The patient is nervous/anxious.   All other systems reviewed and are negative.     Allergies  Morphine and related  Home Medications   Prior to Admission medications   Medication Sig Start Date End Date Taking? Authorizing Provider  albuterol (PROVENTIL HFA;VENTOLIN HFA) 108 (90 BASE) MCG/ACT inhaler Inhale 2 puffs into the lungs every 4 (four) hours as needed for wheezing or shortness of breath. 11/25/13   [name], MD  ALPRAZolam Prudy Feeler) 1 MG tablet Take 1 mg by mouth 2 (two) times daily as needed. For anxiety     Historical Provider, MD  aspirin EC 81 MG tablet Take 324 mg by mouth once.     Historical Provider, MD  cyclobenzaprine (FLEXERIL) 10 MG tablet Take 1 tablet (10 mg total) by mouth 3 (three) times daily as needed for muscle spasms. 11/25/13   [name], MD  dextromethorphan-guaiFENesin (MUCINEX DM) 30-600 MG per 12 hr tablet Take 1 tablet by mouth 2 (two) times daily as needed for cough. 11/25/13   [name], MD  ibuprofen (ADVIL,MOTRIN) 200 MG tablet Take 600 mg by mouth 2 (two) times daily as needed for mild pain or moderate pain. For pain    Historical Provider, MD  levothyroxine (SYNTHROID, LEVOTHROID) 125 MCG tablet Take 1 tablet by mouth daily. 06/19/13   Historical  Provider, MD  omeprazole (PRILOSEC) 20 MG capsule Take 1 capsule by mouth daily. 06/19/13   Historical Provider, MD  potassium chloride SA (K-DUR,KLOR-CON) 20 MEQ tablet Take 1 tablet (20 mEq total) by mouth 2 (two) times daily. 11/25/13   [name], MD  predniSONE (DELTASONE) 20 MG tablet Take 3 po QD x 2d starting tomorrow, then 2 po QD x 3d then 1 po QD x 3d 11/25/13   [name], MD  sertraline (ZOLOFT) 100 MG tablet Take 150 mg by mouth daily. 06/19/13   Historical Provider, MD  sulfamethoxazole-trimethoprim (SEPTRA DS) 800-160 MG per tablet Take 1 tablet by mouth every  12 (twelve) hours. 11/25/13   [name], MD  venlafaxine (EFFEXOR) 37.5 MG tablet Take 37.5 mg by mouth 2 (two) times daily.    Historical Provider, MD   BP 143/93 mmHg  Pulse 86  Temp(Src) 97.5 F (36.4 C) (Oral)  Resp 20  SpO2 100% Physical Exam  Constitutional: She is oriented to person, place, and time. She appears well-developed and well-nourished.  Non-toxic appearance.  HENT:  Head: Normocephalic.  Right Ear: Tympanic membrane and external ear normal.  Left Ear: Tympanic membrane and external ear normal.  Eyes: EOM and lids are normal. Pupils are equal, round, and reactive to light.  Neck: Normal range of motion. Neck supple. Carotid bruit is not present.  Cardiovascular: Normal rate, regular rhythm, normal heart sounds, intact distal pulses and normal pulses.   Pulmonary/Chest: Breath sounds normal. No respiratory distress.  Abdominal: Soft. Bowel sounds are normal. She exhibits no distension, no ascites and no mass. There is no hepatosplenomegaly. There is tenderness in the right upper quadrant and epigastric area. There is no guarding.  Musculoskeletal: Normal range of motion.  Lymphadenopathy:       Head (right side): No submandibular adenopathy present.       Head (left side): No submandibular adenopathy present.    She has no cervical adenopathy.  Neurological: She is alert and oriented to person, place, and time. She has normal strength. No cranial nerve deficit or sensory deficit.  Skin: Skin is warm and dry.  Psychiatric: She has a normal mood and affect. Her speech is normal.  Nursing note and vitals reviewed.   ED Course  Patient much more comfortable after IV Compazine and fentanyl.  Vital signs reviewed.  Comprehensive metabolic panel shows potassium to be slightly low at 3.3, otherwise within normal limits. Lipase was low at 18. It is of note that the patient suffered from pancreatitis approximately 3 years ago. Troponin is negative for acute event. Complete  blood count is well within normal limits. Urinalysis is negative for evidence of renal calculi, or infection. A CT scan is also negative for acute problem.  The patient is referred to Dr. Darrick Penna for additional GI evaluation concerning her right upper quadrant area and epigastric area pain. The CT scan did show some scattered stool with moderate collection. I've asked the patient to increase fluids, as well as T use the laxity of overt choice for the next few days. The patient acknowledges understanding of the findings, as well as the discharge instructions. She is in agreement with this discharge plan.   Procedures (including critical care time) Labs Review Labs Reviewed - No data to display  Imaging Review No results found. I have personally reviewed and evaluated these images and lab results as part of my medical decision-making.   EKG Interpretation None      MDM  Renal function and  liver function well within normal limits. They do not offer a source for the right upper quadrant pain. The lipase is low at 18. Patient has a previous history of pancreatitis. Troponin I is negative for acute cardiac event. Complete blood count is negative for an infection issue, or acute bleeding event. The urinalysis is negative for a renal stone or infection event. CT scan is negative for an acute surgical event.    Final diagnoses:  None    **I have reviewed nursing notes, vital signs, and all appropriate lab and imaging results for this patient.[name], PA-C 03/01/15 [id number]  Dione Booze, MD 03/01/15 [id number]

## 2015-02-28 NOTE — ED Notes (Signed)
Patient complaining of right upper quadrant abdominal pain with nausea. States started around 1600 today and has worsened.

## 2015-03-01 LAB — URINALYSIS, ROUTINE W REFLEX MICROSCOPIC
Bilirubin Urine: NEGATIVE
Glucose, UA: NEGATIVE mg/dL
Hgb urine dipstick: NEGATIVE
KETONES UR: NEGATIVE mg/dL
LEUKOCYTES UA: NEGATIVE
Nitrite: NEGATIVE
PH: 6 (ref 5.0–8.0)
PROTEIN: NEGATIVE mg/dL
Specific Gravity, Urine: <1.005 — ABNORMAL LOW (ref 1.005–1.030)
UROBILINOGEN UA: 0.2 mg/dL (ref 0.0–1.0)

## 2015-03-01 LAB — COMPREHENSIVE METABOLIC PANEL
ALT: 19 U/L (ref 14–54)
AST: 23 U/L (ref 15–41)
Albumin: 4.8 g/dL (ref 3.5–5.0)
Alkaline Phosphatase: 68 U/L (ref 38–126)
Anion gap: 10 (ref 5–15)
BUN: 17 mg/dL (ref 6–20)
CO2: 23 mmol/L (ref 22–32)
CREATININE: 0.92 mg/dL (ref 0.44–1.00)
Calcium: 9.6 mg/dL (ref 8.9–10.3)
Chloride: 107 mmol/L (ref 101–111)
GFR calc non Af Amer: >60 mL/min (ref >60)
Glucose, Bld: 90 mg/dL (ref 65–99)
Potassium: 3.3 mmol/L — ABNORMAL LOW (ref 3.5–5.1)
SODIUM: 140 mmol/L (ref 135–145)
Total Bilirubin: 0.5 mg/dL (ref 0.3–1.2)
Total Protein: 7.5 g/dL (ref 6.5–8.1)

## 2015-03-01 LAB — TROPONIN I: Troponin I: <0.03 ng/mL

## 2015-03-01 LAB — LIPASE, BLOOD: Lipase: 18 U/L — ABNORMAL LOW (ref 22–51)

## 2015-03-01 MED ORDER — POTASSIUM CHLORIDE CRYS ER 20 MEQ PO TBCR
20.0000 meq | EXTENDED_RELEASE_TABLET | Freq: Once | ORAL | Status: AC
  Administered 2015-03-01: 20 meq via ORAL
  Filled 2015-03-01: qty 1

## 2015-03-01 MED ORDER — HYDROCODONE-ACETAMINOPHEN 5-325 MG PO TABS
2.0000 | ORAL_TABLET | Freq: Once | ORAL | Status: AC
  Administered 2015-03-01: 2 via ORAL
  Filled 2015-03-01: qty 2

## 2015-03-01 MED ORDER — PROMETHAZINE HCL 25 MG PO TABS: 25.0000 mg | ORAL_TABLET | Freq: Four times a day (QID) | ORAL | Status: DC | PRN

## 2015-03-01 MED ORDER — IOHEXOL 300 MG/ML  SOLN
100.0000 mL | Freq: Once | INTRAMUSCULAR | Status: AC | PRN
  Administered 2015-03-01: 100 mL via INTRAVENOUS

## 2015-03-01 MED ORDER — HYDROCODONE-ACETAMINOPHEN 5-325 MG PO TABS: 1.0000 | ORAL_TABLET | ORAL | Status: DC | PRN

## 2015-03-01 NOTE — Discharge Instructions (Signed)
Your lab work is negative for acute event. Your potassium is slightly low at 3.3. Please increase foods high in potassium. Your CT scan is negative for any acute issue. Your troponin tests concerning your heart is negative for acute event. Please see Dr. Darrick Penna, or the GI specialist of your choice to complete the workup of your right upper quadrant area pain. Your CT scan does show increased stool throughout her colon. A mild laxity of maybe helpful. Abdominal Pain Many things can cause belly (abdominal) pain. Most times, the belly pain is not dangerous. Many cases of belly pain can be watched and treated at home. HOME CARE   Do not take medicines that help you go poop (laxatives) unless told to by your doctor.  Only take medicine as told by your doctor.  Eat or drink as told by your doctor. Your doctor will tell you if you should be on a special diet. GET HELP IF:  You do not know what is causing your belly pain.  You have belly pain while you are sick to your stomach (nauseous) or have runny poop (diarrhea).  You have pain while you pee or poop.  Your belly pain wakes you up at night.  You have belly pain that gets worse or better when you eat.  You have belly pain that gets worse when you eat fatty foods.  You have a fever. GET HELP RIGHT AWAY IF:   The pain does not go away within 2 hours.  You keep throwing up (vomiting).  The pain changes and is only in the right or left part of the belly.  You have bloody or tarry looking poop. MAKE SURE YOU:   Understand these instructions.  Will watch your condition.  Will get help right away if you are not doing well or get worse. Document Released: 11/25/2007 Document Revised: 06/13/2013 Document Reviewed: 02/15/2013 [facility] Patient Information 2015 [place], [place]. This information is not intended to replace advice given to you by your health care provider. Make sure you discuss any questions you have with your health care  provider.  Potassium Content of Foods Potassium is a mineral found in many foods and drinks. It helps keep fluids and minerals balanced in your body and affects how steadily your heart beats. Potassium also helps control your blood pressure and keep your muscles and nervous system healthy. Certain health conditions and medicines may change the balance of potassium in your body. When this happens, you can help balance your level of potassium through the foods that you do or do not eat. Your health care provider or dietitian may recommend an amount of potassium that you should have each day. The following lists of foods provide the amount of potassium (in parentheses) per serving in each item. HIGH IN POTASSIUM  The following foods and beverages have 200 mg or more of potassium per serving:  Apricots, 2 raw or 5 dry (200 mg).  Artichoke, 1 medium (345 mg).  Avocado, raw,  each (245 mg).  Banana, 1 medium (425 mg).  Beans, lima, or baked beans, canned,  cup (280 mg).  Beans, white, canned,  cup (595 mg).  Beef roast, 3 oz (320 mg).  Beef, ground, 3 oz (270 mg).  Beets, raw or cooked,  cup (260 mg).  Bran muffin, 2 oz (300 mg).  Broccoli,  cup (230 mg).  Brussels sprouts,  cup (250 mg).  Cantaloupe,  cup (215 mg).  Cereal, 100% bran,  cup (200-400 mg).  Cheeseburger,  single, fast food, 1 each (225-400 mg).  Chicken, 3 oz (220 mg).  Clams, canned, 3 oz (535 mg).  Crab, 3 oz (225 mg).  Dates, 5 each (270 mg).  Dried beans and peas,  cup (300-475 mg).  Figs, dried, 2 each (260 mg).  Fish: halibut, tuna, cod, snapper, 3 oz (480 mg).  Fish: salmon, haddock, swordfish, perch, 3 oz (300 mg).  Fish, tuna, canned 3 oz (200 mg).  [place] fries, fast food, 3 oz (470 mg).  Granola with fruit and nuts,  cup (200 mg).  Grapefruit juice,  cup (200 mg).  Greens, beet,  cup (655 mg).  Honeydew melon,  cup (200 mg).  Kale, raw, 1 cup (300 mg).  Kiwi, 1  medium (240 mg).  Kohlrabi, rutabaga, parsnips,  cup (280 mg).  Lentils,  cup (365 mg).  Mango, 1 each (325 mg).  Milk, chocolate, 1 cup (420 mg).  Milk: nonfat, low-fat, whole, buttermilk, 1 cup (350-380 mg).  Molasses, 1 Tbsp (295 mg).  Mushrooms,  cup (280) mg.  Nectarine, 1 each (275 mg).  Nuts: almonds, peanuts, hazelnuts, [place], cashew, mixed, 1 oz (200 mg).  Nuts, pistachios, 1 oz (295 mg).  Orange, 1 each (240 mg).  Orange juice,  cup (235 mg).  Papaya, medium,  fruit (390 mg).  Peanut butter, chunky, 2 Tbsp (240 mg).  Peanut butter, smooth, 2 Tbsp (210 mg).  Pear, 1 medium (200 mg).  Pomegranate, 1 whole (400 mg).  Pomegranate juice,  cup (215 mg).  Pork, 3 oz (350 mg).  Potato chips, salted, 1 oz (465 mg).  Potato, baked with skin, 1 medium (925 mg).  Potatoes, boiled,  cup (255 mg).  Potatoes, mashed,  cup (330 mg).  Prune juice,  cup (370 mg).  Prunes, 5 each (305 mg).  Pudding, chocolate,  cup (230 mg).  Pumpkin, canned,  cup (250 mg).  Raisins, seedless,  cup (270 mg).  Seeds, sunflower or pumpkin, 1 oz (240 mg).  Soy milk, 1 cup (300 mg).  Spinach,  cup (420 mg).  Spinach, canned,  cup (370 mg).  Sweet potato, baked with skin, 1 medium (450 mg).  Swiss chard,  cup (480 mg).  Tomato or vegetable juice,  cup (275 mg).  Tomato sauce or puree,  cup (400-550 mg).  Tomato, raw, 1 medium (290 mg).  Tomatoes, canned,  cup (200-300 mg).  [place], 3 oz (250 mg).  Wheat germ, 1 oz (250 mg).  Winter squash,  cup (250 mg).  Yogurt, plain or fruited, 6 oz (260-435 mg).  Zucchini,  cup (220 mg). MODERATE IN POTASSIUM The following foods and beverages have 50-200 mg of potassium per serving:  Apple, 1 each (150 mg).  Apple juice,  cup (150 mg).  Applesauce,  cup (90 mg).  Apricot nectar,  cup (140 mg).  Asparagus, small spears,  cup or 6 spears (155 mg).  Bagel, cinnamon raisin, 1 each (130  mg).  Bagel, egg or plain, 4 in., 1 each (70 mg).  Beans, green,  cup (90 mg).  Beans, yellow,  cup (190 mg).  Beer, regular, 12 oz (100 mg).  Beets, canned,  cup (125 mg).  Blackberries,  cup (115 mg).  Blueberries,  cup (60 mg).  Bread, whole wheat, 1 slice (70 mg).  Broccoli, raw,  cup (145 mg).  Cabbage,  cup (150 mg).  Carrots, cooked or raw,  cup (180 mg).  Cauliflower, raw,  cup (150 mg).  Celery, raw,  cup (155 mg).  Cereal, bran flakes, cup (120-150 mg).  Cheese, cottage,  cup (110 mg).  Cherries, 10 each (150 mg).  Chocolate, 1 oz bar (165 mg).  Coffee, brewed 6 oz (90 mg).  Corn,  cup or 1 ear (195 mg).  Cucumbers,  cup (80 mg).  Egg, large, 1 each (60 mg).  Eggplant,  cup (60 mg).  Endive, raw, cup (80 mg).  English muffin, 1 each (65 mg).  Fish, orange roughy, 3 oz (150 mg).  Frankfurter, beef or pork, 1 each (75 mg).  Fruit cocktail,  cup (115 mg).  Grape juice,  cup (170 mg).  Grapefruit,  fruit (175 mg).  Grapes,  cup (155 mg).  Greens: kale, turnip, collard,  cup (110-150 mg).  Ice cream or frozen yogurt, chocolate,  cup (175 mg).  Ice cream or frozen yogurt, vanilla,  cup (120-150 mg).  Lemons, limes, 1 each (80 mg).  Lettuce, all types, 1 cup (100 mg).  Mixed vegetables,  cup (150 mg).  Mushrooms, raw,  cup (110 mg).  Nuts: walnuts, pecans, or macadamia, 1 oz (125 mg).  Oatmeal,  cup (80 mg).  Okra,  cup (110 mg).  Onions, raw,  cup (120 mg).  Peach, 1 each (185 mg).  Peaches, canned,  cup (120 mg).  Pears, canned,  cup (120 mg).  Peas, green, frozen,  cup (90 mg).  Peppers, green,  cup (130 mg).  Peppers, red,  cup (160 mg).  Pineapple juice,  cup (165 mg).  Pineapple, fresh or canned,  cup (100 mg).  Plums, 1 each (105 mg).  Pudding, vanilla,  cup (150 mg).  Raspberries,  cup (90 mg).  Rhubarb,  cup (115 mg).  Rice, wild,  cup (80 mg).  Shrimp, 3  oz (155 mg).  Spinach, raw, 1 cup (170 mg).  Strawberries,  cup (125 mg).  Summer squash  cup (175-200 mg).  Swiss chard, raw, 1 cup (135 mg).  Tangerines, 1 each (140 mg).  Tea, brewed, 6 oz (65 mg).  Turnips,  cup (140 mg).  Watermelon,  cup (85 mg).  Wine, red, table, 5 oz (180 mg).  Wine, white, table, 5 oz (100 mg). LOW IN POTASSIUM The following foods and beverages have less than 50 mg of potassium per serving.  Bread, white, 1 slice (30 mg).  Carbonated beverages, 12 oz (less than 5 mg).  Cheese, 1 oz (20-30 mg).  Cranberries,  cup (45 mg).  Cranberry juice cocktail,  cup (20 mg).  Fats and oils, 1 Tbsp (less than 5 mg).  Hummus, 1 Tbsp (32 mg).  Nectar: papaya, mango, or pear,  cup (35 mg).  Rice, white or brown,  cup (50 mg).  Spaghetti or macaroni,  cup cooked (30 mg).  Tortilla, flour or corn, 1 each (50 mg).  Waffle, 4 in., 1 each (50 mg).  Water chestnuts,  cup (40 mg). Document Released: 01/20/2005 Document Revised: 06/13/2013 Document Reviewed: 05/05/2013 [facility] Patient Information 2015 [place], [place]. This information is not intended to replace advice given to you by your health care provider. Make sure you discuss any questions you have with your health care provider.

## 2015-04-23 ENCOUNTER — Encounter (HOSPITAL_COMMUNITY): Payer: Self-pay | Admitting: Emergency Medicine

## 2015-04-23 ENCOUNTER — Emergency Department (HOSPITAL_COMMUNITY)
Admission: EM | Admit: 2015-04-23 | Discharge: 2015-04-24 | Disposition: A | Discharge status: Home / Self Care | Payer: BLUE CROSS/BLUE SHIELD | Attending: Emergency Medicine | Admitting: Emergency Medicine

## 2015-04-23 DIAGNOSIS — Z90710 Acquired absence of both cervix and uterus: Secondary | ICD-10-CM | POA: Insufficient documentation

## 2015-04-23 DIAGNOSIS — R000 Tachycardia, unspecified: Secondary | ICD-10-CM | POA: Insufficient documentation

## 2015-04-23 DIAGNOSIS — Z79899 Other long term (current) drug therapy: Secondary | ICD-10-CM | POA: Insufficient documentation

## 2015-04-23 DIAGNOSIS — F419 Anxiety disorder, unspecified: Secondary | ICD-10-CM | POA: Insufficient documentation

## 2015-04-23 DIAGNOSIS — K529 Noninfective gastroenteritis and colitis, unspecified: Secondary | ICD-10-CM | POA: Diagnosis not present

## 2015-04-23 DIAGNOSIS — R109 Unspecified abdominal pain: Secondary | ICD-10-CM | POA: Diagnosis present

## 2015-04-23 DIAGNOSIS — Z9049 Acquired absence of other specified parts of digestive tract: Secondary | ICD-10-CM | POA: Insufficient documentation

## 2015-04-23 DIAGNOSIS — E079 Disorder of thyroid, unspecified: Secondary | ICD-10-CM | POA: Insufficient documentation

## 2015-04-23 DIAGNOSIS — Z720 Tobacco use: Secondary | ICD-10-CM | POA: Insufficient documentation

## 2015-04-23 DIAGNOSIS — Z8679 Personal history of other diseases of the circulatory system: Secondary | ICD-10-CM | POA: Insufficient documentation

## 2015-04-23 LAB — CBC WITH DIFFERENTIAL/PLATELET
BASOS ABS: 0 10*3/uL (ref 0.0–0.1)
Basophils Relative: 0 %
EOS ABS: 0 10*3/uL (ref 0.0–0.7)
Eosinophils Relative: 0 %
HCT: 36.9 % (ref 36.0–46.0)
Hemoglobin: 12.8 g/dL (ref 12.0–15.0)
Lymphocytes Relative: 13 %
Lymphs Abs: 0.8 10*3/uL (ref 0.7–4.0)
MCH: 31.8 pg (ref 26.0–34.0)
MCHC: 34.7 g/dL (ref 30.0–36.0)
MCV: 91.6 fL (ref 78.0–100.0)
MONO ABS: 0.4 10*3/uL (ref 0.1–1.0)
MONOS PCT: 6 %
Neutro Abs: 5.3 10*3/uL (ref 1.7–7.7)
Neutrophils Relative %: 81 %
PLATELETS: 151 10*3/uL (ref 150–400)
RBC: 4.03 MIL/uL (ref 3.87–5.11)
RDW: 12.5 % (ref 11.5–15.5)
WBC: 6.5 10*3/uL (ref 4.0–10.5)

## 2015-04-23 MED ORDER — ONDANSETRON HCL 4 MG/2ML IJ SOLN
4.0000 mg | Freq: Once | INTRAMUSCULAR | Status: AC
  Administered 2015-04-23: 4 mg via INTRAVENOUS

## 2015-04-23 MED ORDER — DICYCLOMINE HCL 10 MG PO CAPS
10.0000 mg | ORAL_CAPSULE | Freq: Once | ORAL | Status: AC
  Administered 2015-04-23: 10 mg via ORAL
  Filled 2015-04-23: qty 1

## 2015-04-23 MED ORDER — SODIUM CHLORIDE 0.9 % IV SOLN
Freq: Once | INTRAVENOUS | Status: AC
  Administered 2015-04-23: 23:00:00 via INTRAVENOUS

## 2015-04-23 MED ORDER — ONDANSETRON HCL 4 MG/2ML IJ SOLN
4.0000 mg | Freq: Once | INTRAMUSCULAR | Status: DC
  Filled 2015-04-23: qty 2

## 2015-04-23 MED ORDER — SODIUM CHLORIDE 0.9 % IV BOLUS (SEPSIS)
1000.0000 mL | Freq: Once | INTRAVENOUS | Status: AC
  Administered 2015-04-23: 1000 mL via INTRAVENOUS

## 2015-04-23 NOTE — ED Notes (Signed)
Pt states yesterday the diarrhea and vomiting started, but today she has had fever, bodyaches and abd cramping.

## 2015-04-23 NOTE — ED Provider Notes (Signed)
CSN: [id number]     Arrival date & time 04/23/15  2117 History  By signing my name below, I, Lyndel SafeKaitlyn Shelton, attest that this documentation has been prepared under the direction and in the presence of Shon Batonourtney F Horton, MD. Electronically Signed: Lyndel SafeKaitlyn Shelton, ED Scribe. 04/23/2015. 11:32 PM.  Chief Complaint  Patient presents with  . Abdominal Cramping   The history is provided by the patient. No language interpreter was used.   HPI Comments: [name] is a 44 y.o. female, with a PMhx of anxiety and thyroid disease on synthroid, who presents to the Emergency Department complaining of constant, moderate generalized myalgias, emesis and diarrhea onset 1 day ago with worsening of these symptoms today upon waking up and onset of intermittent, 8/10, LLQ abdominal cramping this evening. The pain sometimes radiates into the right lower quadrant. She describes it as crampy and waxing and waning. The pt additionally reports a fever of 103F today that was relieved with 800mg  ibuprofen. Pt afebrile on triage vitals. Denies cough, hematochezia, hematemesis, or any urinary symptoms.   Past Medical History  Diagnosis Date  . Anxiety   . IBS (irritable bowel syndrome)   . Thyroid disease   . Diverticula of intestine   . Ulcerative colitis   . Migraines   . Vertigo    Past Surgical History  Procedure Laterality Date  . Abdominal hysterectomy    . Appendectomy    . Cholecystectomy    . Knee arthroscopy     Family History  Problem Relation Age of Onset  . Diabetes Mother   . COPD Mother   . Heart disease Mother   . Breast cancer Paternal Grandmother   . Lung cancer Paternal Grandfather    Social History  Substance Use Topics  . Smoking status: Current Every Day Smoker -- 1.00 packs/day for 20 years    Types: Cigarettes  . Smokeless tobacco: Never Used  . Alcohol Use: No     Comment: socially   OB History    No data available     Review of Systems  Constitutional: Positive  for fever.  Respiratory: Negative for cough.   Gastrointestinal: Positive for vomiting, abdominal pain and diarrhea.  Genitourinary: Negative for dysuria and hematuria.  Musculoskeletal: Positive for myalgias (generalized ).  All other systems reviewed and are negative.  Allergies  Morphine and related  Home Medications   Prior to Admission medications   Medication Sig Start Date End Date Taking? Authorizing Provider  albuterol (PROVENTIL HFA;VENTOLIN HFA) 108 (90 BASE) MCG/ACT inhaler Inhale 2 puffs into the lungs every 4 (four) hours as needed for wheezing or shortness of breath. 11/25/13  Yes Devoria AlbeIva Knapp, MD  ALPRAZolam Prudy Feeler(XANAX) 1 MG tablet Take 1 mg by mouth 2 (two) times daily as needed. For anxiety    Yes Historical Provider, MD  HYDROcodone-acetaminophen (NORCO/VICODIN) 5-325 MG per tablet Take 1 tablet by mouth every 4 (four) hours as needed. 03/01/15  Yes Ivery QualeHobson Bryant, PA-C  ibuprofen (ADVIL,MOTRIN) 200 MG tablet Take 600 mg by mouth 2 (two) times daily as needed for mild pain or moderate pain. For pain   Yes Historical Provider, MD  levothyroxine (SYNTHROID, LEVOTHROID) 112 MCG tablet Take 112 mcg by mouth daily. 04/17/15  Yes Historical Provider, MD  promethazine (PHENERGAN) 25 MG tablet Take 1 tablet (25 mg total) by mouth every 6 (six) hours as needed for nausea or vomiting. 03/01/15  Yes Ivery QualeHobson Bryant, PA-C  loperamide (IMODIUM) 2 MG capsule Take 1 capsule (2 mg  total) by mouth 4 (four) times daily as needed for diarrhea or loose stools. 04/24/15   [name], MD  ondansetron (ZOFRAN ODT) 4 MG disintegrating tablet Take 1 tablet (4 mg total) by mouth every 8 (eight) hours as needed for nausea or vomiting. 04/24/15   [name], MD   BP 102/59 mmHg  Pulse 118  Temp(Src) 98.1 F (36.7 C) (Oral)  Resp 20  Ht  (1.676 m)  Wt 135 lb (61.236 kg)  BMI 21.80 kg/m2  SpO2 97% Physical Exam  Constitutional: She is oriented to person, place, and time. She appears  well-developed and well-nourished. No distress.  HENT:  Head: Normocephalic and atraumatic.  MM dry  Cardiovascular: Regular rhythm and normal heart sounds.   No murmur heard. tachycardia  Pulmonary/Chest: Effort normal. No respiratory distress. She has no wheezes.  Abdominal: Soft. Bowel sounds are normal. There is no tenderness. There is no rebound and no guarding.  Neurological: She is alert and oriented to person, place, and time.  Skin: Skin is warm and dry.  Psychiatric: She has a normal mood and affect.  Nursing note and vitals reviewed.   ED Course  Procedures  DIAGNOSTIC STUDIES: Oxygen Saturation is 97% on RA, normal by my interpretation.    COORDINATION OF CARE: 11:12 PM Discussed treatment plan with pt at bedside and pt agreed to plan.  Labs Review Labs Reviewed  COMPREHENSIVE METABOLIC PANEL - Abnormal; Notable for the following:    Potassium 3.3 (*)    Glucose, Bld 103 (*)    Calcium 8.8 (*)    Total Protein 6.4 (*)    AST 217 (*)    ALT 167 (*)    All other components within normal limits  CBC WITH DIFFERENTIAL/PLATELET  LIPASE, BLOOD  I have personally reviewed and evaluated these lab results as part of my medical decision-making.    MDM   Final diagnoses:  Gastroenteritis   Patient presents with vomiting, diarrhea, and crampy abdominal pain. Also reports fever.  Nontoxic on exam. Mucous membranes are dry and she is tachycardic. Abdominal exam is relatively benign with no point tenderness noted. Patient was given fluids and Zofran. Basic labwork obtained and reassuring. No leukocytosis. Patient has a listed past medical history of ulcerative colitis. She reports that she is not on any medications and this is well controlled. After reevaluation, abdominal exam continues to be benign and patient reports improvement of symptoms. She is able to hold down liquids. She has had sick contacts. This is most likely a viral gastroenteritis. At this time would not  pursue further imaging given relatively benign abdominal exam. However, given history of ulcerative colitis, patient was given strict return precautions. She will be given Zofran and Imodium for symptom control at home.  After history, exam, and medical workup I feel the patient has been appropriately medically screened and is safe for discharge home. Pertinent diagnoses were discussed with the patient. Patient was given return precautions.  I personally performed the services described in this documentation, which was scribed in my presence. The recorded information has been reviewed and is accurate.   [name], MD 04/24/15 [phone]

## 2015-04-24 LAB — COMPREHENSIVE METABOLIC PANEL
ALBUMIN: 4 g/dL (ref 3.5–5.0)
ALT: 167 U/L — ABNORMAL HIGH (ref 14–54)
AST: 217 U/L — AB (ref 15–41)
Alkaline Phosphatase: 86 U/L (ref 38–126)
Anion gap: 9 (ref 5–15)
BUN: 15 mg/dL (ref 6–20)
CO2: 24 mmol/L (ref 22–32)
Calcium: 8.8 mg/dL — ABNORMAL LOW (ref 8.9–10.3)
Chloride: 105 mmol/L (ref 101–111)
Creatinine, Ser: 0.93 mg/dL (ref 0.44–1.00)
GFR calc Af Amer: >60 mL/min (ref >60)
GFR calc non Af Amer: >60 mL/min (ref >60)
GLUCOSE: 103 mg/dL — AB (ref 65–99)
POTASSIUM: 3.3 mmol/L — AB (ref 3.5–5.1)
SODIUM: 138 mmol/L (ref 135–145)
TOTAL PROTEIN: 6.4 g/dL — AB (ref 6.5–8.1)
Total Bilirubin: 1 mg/dL (ref 0.3–1.2)

## 2015-04-24 LAB — LIPASE, BLOOD: Lipase: 22 U/L (ref 11–51)

## 2015-04-24 MED ORDER — ONDANSETRON 4 MG PO TBDP: 4.0000 mg | ORAL_TABLET | Freq: Three times a day (TID) | ORAL | Status: DC | PRN

## 2015-04-24 MED ORDER — LOPERAMIDE HCL 2 MG PO CAPS: 2.0000 mg | ORAL_CAPSULE | Freq: Four times a day (QID) | ORAL | Status: DC | PRN

## 2015-04-24 NOTE — Discharge Instructions (Signed)

## 2015-04-24 NOTE — ED Notes (Signed)
Pt provided with ginger ale.  Able to hold small sips down

## 2015-10-05 DIAGNOSIS — R51 Headache: Secondary | ICD-10-CM | POA: Diagnosis not present

## 2015-10-05 DIAGNOSIS — E039 Hypothyroidism, unspecified: Secondary | ICD-10-CM | POA: Diagnosis not present

## 2015-10-05 DIAGNOSIS — Z79899 Other long term (current) drug therapy: Secondary | ICD-10-CM | POA: Diagnosis not present

## 2015-10-05 DIAGNOSIS — R079 Chest pain, unspecified: Secondary | ICD-10-CM | POA: Diagnosis not present

## 2015-10-05 DIAGNOSIS — R042 Hemoptysis: Secondary | ICD-10-CM | POA: Diagnosis not present

## 2015-10-05 DIAGNOSIS — R55 Syncope and collapse: Secondary | ICD-10-CM | POA: Diagnosis not present

## 2015-10-05 DIAGNOSIS — Z885 Allergy status to narcotic agent status: Secondary | ICD-10-CM | POA: Diagnosis not present

## 2015-10-05 DIAGNOSIS — F419 Anxiety disorder, unspecified: Secondary | ICD-10-CM | POA: Diagnosis not present

## 2015-10-05 DIAGNOSIS — G43909 Migraine, unspecified, not intractable, without status migrainosus: Secondary | ICD-10-CM | POA: Diagnosis not present

## 2015-10-18 ENCOUNTER — Emergency Department (HOSPITAL_COMMUNITY): Payer: BLUE CROSS/BLUE SHIELD

## 2015-10-18 ENCOUNTER — Inpatient Hospital Stay (HOSPITAL_COMMUNITY)
Admission: EM | Admit: 2015-10-18 | Discharge: 2015-10-23 | DRG: 392 | Disposition: A | Discharge status: Home / Self Care | Payer: BLUE CROSS/BLUE SHIELD | Attending: Internal Medicine | Admitting: Internal Medicine

## 2015-10-18 ENCOUNTER — Encounter (HOSPITAL_COMMUNITY): Payer: Self-pay

## 2015-10-18 DIAGNOSIS — E861 Hypovolemia: Secondary | ICD-10-CM | POA: Diagnosis present

## 2015-10-18 DIAGNOSIS — Z801 Family history of malignant neoplasm of trachea, bronchus and lung: Secondary | ICD-10-CM

## 2015-10-18 DIAGNOSIS — K2900 Acute gastritis without bleeding: Secondary | ICD-10-CM | POA: Diagnosis present

## 2015-10-18 DIAGNOSIS — Z8249 Family history of ischemic heart disease and other diseases of the circulatory system: Secondary | ICD-10-CM | POA: Diagnosis not present

## 2015-10-18 DIAGNOSIS — K3189 Other diseases of stomach and duodenum: Secondary | ICD-10-CM | POA: Diagnosis not present

## 2015-10-18 DIAGNOSIS — K5904 Chronic idiopathic constipation: Secondary | ICD-10-CM | POA: Diagnosis not present

## 2015-10-18 DIAGNOSIS — Z833 Family history of diabetes mellitus: Secondary | ICD-10-CM

## 2015-10-18 DIAGNOSIS — R1310 Dysphagia, unspecified: Secondary | ICD-10-CM | POA: Diagnosis not present

## 2015-10-18 DIAGNOSIS — Z825 Family history of asthma and other chronic lower respiratory diseases: Secondary | ICD-10-CM

## 2015-10-18 DIAGNOSIS — K529 Noninfective gastroenteritis and colitis, unspecified: Secondary | ICD-10-CM | POA: Diagnosis not present

## 2015-10-18 DIAGNOSIS — D649 Anemia, unspecified: Secondary | ICD-10-CM | POA: Diagnosis not present

## 2015-10-18 DIAGNOSIS — K210 Gastro-esophageal reflux disease with esophagitis: Secondary | ICD-10-CM | POA: Diagnosis present

## 2015-10-18 DIAGNOSIS — K5909 Other constipation: Secondary | ICD-10-CM | POA: Diagnosis not present

## 2015-10-18 DIAGNOSIS — K2980 Duodenitis without bleeding: Secondary | ICD-10-CM | POA: Diagnosis present

## 2015-10-18 DIAGNOSIS — D696 Thrombocytopenia, unspecified: Secondary | ICD-10-CM | POA: Diagnosis not present

## 2015-10-18 DIAGNOSIS — Z9049 Acquired absence of other specified parts of digestive tract: Secondary | ICD-10-CM | POA: Diagnosis not present

## 2015-10-18 DIAGNOSIS — E860 Dehydration: Secondary | ICD-10-CM | POA: Diagnosis present

## 2015-10-18 DIAGNOSIS — A09 Infectious gastroenteritis and colitis, unspecified: Principal | ICD-10-CM | POA: Diagnosis present

## 2015-10-18 DIAGNOSIS — F17210 Nicotine dependence, cigarettes, uncomplicated: Secondary | ICD-10-CM | POA: Diagnosis not present

## 2015-10-18 DIAGNOSIS — F418 Other specified anxiety disorders: Secondary | ICD-10-CM | POA: Diagnosis present

## 2015-10-18 DIAGNOSIS — Z803 Family history of malignant neoplasm of breast: Secondary | ICD-10-CM | POA: Diagnosis not present

## 2015-10-18 DIAGNOSIS — K2960 Other gastritis without bleeding: Secondary | ICD-10-CM | POA: Diagnosis not present

## 2015-10-18 DIAGNOSIS — E039 Hypothyroidism, unspecified: Secondary | ICD-10-CM | POA: Diagnosis present

## 2015-10-18 DIAGNOSIS — R1110 Vomiting, unspecified: Secondary | ICD-10-CM | POA: Diagnosis present

## 2015-10-18 DIAGNOSIS — K5900 Constipation, unspecified: Secondary | ICD-10-CM | POA: Diagnosis not present

## 2015-10-18 DIAGNOSIS — Z720 Tobacco use: Secondary | ICD-10-CM | POA: Diagnosis present

## 2015-10-18 DIAGNOSIS — R112 Nausea with vomiting, unspecified: Secondary | ICD-10-CM | POA: Diagnosis not present

## 2015-10-18 DIAGNOSIS — E876 Hypokalemia: Secondary | ICD-10-CM | POA: Diagnosis present

## 2015-10-18 DIAGNOSIS — R109 Unspecified abdominal pain: Secondary | ICD-10-CM | POA: Diagnosis not present

## 2015-10-18 LAB — URINALYSIS, ROUTINE W REFLEX MICROSCOPIC
BILIRUBIN URINE: NEGATIVE
Glucose, UA: NEGATIVE mg/dL
Hgb urine dipstick: NEGATIVE
KETONES UR: NEGATIVE mg/dL
Leukocytes, UA: NEGATIVE
NITRITE: NEGATIVE
Protein, ur: NEGATIVE mg/dL
SPECIFIC GRAVITY, URINE: 1.015 (ref 1.005–1.030)
pH: 6 (ref 5.0–8.0)

## 2015-10-18 LAB — COMPREHENSIVE METABOLIC PANEL
ALT: 33 U/L (ref 14–54)
AST: 27 U/L (ref 15–41)
Albumin: 4.7 g/dL (ref 3.5–5.0)
Alkaline Phosphatase: 64 U/L (ref 38–126)
Anion gap: 12 (ref 5–15)
BUN: 16 mg/dL (ref 6–20)
CHLORIDE: 106 mmol/L (ref 101–111)
CO2: 21 mmol/L — ABNORMAL LOW (ref 22–32)
CREATININE: 0.88 mg/dL (ref 0.44–1.00)
Calcium: 9.1 mg/dL (ref 8.9–10.3)
GFR calc Af Amer: >60 mL/min (ref >60)
Glucose, Bld: 109 mg/dL — ABNORMAL HIGH (ref 65–99)
POTASSIUM: 3.3 mmol/L — AB (ref 3.5–5.1)
Sodium: 139 mmol/L (ref 135–145)
Total Bilirubin: 0.7 mg/dL (ref 0.3–1.2)
Total Protein: 7.4 g/dL (ref 6.5–8.1)

## 2015-10-18 LAB — CBC WITH DIFFERENTIAL/PLATELET
Basophils Absolute: 0 10*3/uL (ref 0.0–0.1)
Basophils Relative: 0 %
EOS PCT: 1 %
Eosinophils Absolute: 0.2 10*3/uL (ref 0.0–0.7)
HCT: 40.4 % (ref 36.0–46.0)
Hemoglobin: 14 g/dL (ref 12.0–15.0)
LYMPHS ABS: 1.2 10*3/uL (ref 0.7–4.0)
Lymphocytes Relative: 7 %
MCH: 31.9 pg (ref 26.0–34.0)
MCHC: 34.7 g/dL (ref 30.0–36.0)
MCV: 92 fL (ref 78.0–100.0)
MONO ABS: 0.7 10*3/uL (ref 0.1–1.0)
Monocytes Relative: 4 %
Neutro Abs: 15.3 10*3/uL — ABNORMAL HIGH (ref 1.7–7.7)
Neutrophils Relative %: 88 %
PLATELETS: 231 10*3/uL (ref 150–400)
RBC: 4.39 MIL/uL (ref 3.87–5.11)
RDW: 12.6 % (ref 11.5–15.5)
WBC: 17.4 10*3/uL — ABNORMAL HIGH (ref 4.0–10.5)

## 2015-10-18 MED ORDER — FENTANYL CITRATE (PF) 100 MCG/2ML IJ SOLN
50.0000 ug | INTRAMUSCULAR | Status: DC | PRN
  Filled 2015-10-18: qty 2

## 2015-10-18 MED ORDER — SODIUM CHLORIDE 0.9 % IV BOLUS (SEPSIS)
1000.0000 mL | Freq: Once | INTRAVENOUS | Status: AC
  Administered 2015-10-18: 1000 mL via INTRAVENOUS

## 2015-10-18 MED ORDER — ENOXAPARIN SODIUM 40 MG/0.4ML ~~LOC~~ SOLN
40.0000 mg | SUBCUTANEOUS | Status: DC
  Administered 2015-10-18 – 2015-10-22 (×5): 40 mg via SUBCUTANEOUS
  Filled 2015-10-18 (×5): qty 0.4

## 2015-10-18 MED ORDER — ACETAMINOPHEN 650 MG RE SUPP: 650.0000 mg | Freq: Four times a day (QID) | RECTAL | Status: DC | PRN

## 2015-10-18 MED ORDER — HYDROMORPHONE HCL 1 MG/ML IJ SOLN
1.0000 mg | Freq: Once | INTRAMUSCULAR | Status: AC
  Administered 2015-10-18: 1 mg via INTRAVENOUS
  Filled 2015-10-18: qty 1

## 2015-10-18 MED ORDER — LORAZEPAM 2 MG/ML IJ SOLN
1.0000 mg | Freq: Once | INTRAMUSCULAR | Status: AC
  Administered 2015-10-18: 1 mg via INTRAVENOUS

## 2015-10-18 MED ORDER — IOPAMIDOL (ISOVUE-300) INJECTION 61%
100.0000 mL | Freq: Once | INTRAVENOUS | Status: AC | PRN
  Administered 2015-10-18: 100 mL via INTRAVENOUS

## 2015-10-18 MED ORDER — ONDANSETRON HCL 4 MG/2ML IJ SOLN
4.0000 mg | Freq: Once | INTRAMUSCULAR | Status: AC | PRN
  Administered 2015-10-18: 4 mg via INTRAVENOUS

## 2015-10-18 MED ORDER — PROCHLORPERAZINE EDISYLATE 5 MG/ML IJ SOLN
10.0000 mg | Freq: Once | INTRAMUSCULAR | Status: AC
  Administered 2015-10-18: 10 mg via INTRAVENOUS
  Filled 2015-10-18: qty 2

## 2015-10-18 MED ORDER — DICYCLOMINE HCL 10 MG/ML IM SOLN
20.0000 mg | Freq: Once | INTRAMUSCULAR | Status: AC
  Administered 2015-10-18: 20 mg via INTRAMUSCULAR
  Filled 2015-10-18: qty 2

## 2015-10-18 MED ORDER — ONDANSETRON HCL 4 MG PO TABS: 4.0000 mg | ORAL_TABLET | Freq: Four times a day (QID) | ORAL | Status: DC | PRN

## 2015-10-18 MED ORDER — ONDANSETRON HCL 4 MG/2ML IJ SOLN
4.0000 mg | Freq: Four times a day (QID) | INTRAMUSCULAR | Status: DC | PRN
  Administered 2015-10-18 – 2015-10-19 (×2): 4 mg via INTRAVENOUS
  Filled 2015-10-18 (×3): qty 2

## 2015-10-18 MED ORDER — FENTANYL CITRATE (PF) 100 MCG/2ML IJ SOLN
100.0000 ug | Freq: Once | INTRAMUSCULAR | Status: AC
  Administered 2015-10-18: 100 ug via INTRAVENOUS

## 2015-10-18 MED ORDER — VITAMIN B-12 100 MCG PO TABS
100.0000 ug | ORAL_TABLET | Freq: Every day | ORAL | Status: DC
Start: 2015-10-19 — End: 2015-10-19
  Filled 2015-10-18 (×3): qty 1

## 2015-10-18 MED ORDER — ACETAMINOPHEN 325 MG PO TABS
650.0000 mg | ORAL_TABLET | Freq: Four times a day (QID) | ORAL | Status: DC | PRN
Start: 2015-10-18 — End: 2015-10-23
  Administered 2015-10-20 – 2015-10-21 (×2): 650 mg via ORAL
  Filled 2015-10-18 (×2): qty 2

## 2015-10-18 MED ORDER — CIPROFLOXACIN IN D5W 400 MG/200ML IV SOLN
400.0000 mg | Freq: Once | INTRAVENOUS | Status: AC
  Administered 2015-10-18: 400 mg via INTRAVENOUS
  Filled 2015-10-18: qty 200

## 2015-10-18 MED ORDER — METRONIDAZOLE IN NACL 5-0.79 MG/ML-% IV SOLN
500.0000 mg | Freq: Once | INTRAVENOUS | Status: AC
  Administered 2015-10-18: 500 mg via INTRAVENOUS
  Filled 2015-10-18: qty 100

## 2015-10-18 MED ORDER — METRONIDAZOLE IN NACL 5-0.79 MG/ML-% IV SOLN
500.0000 mg | Freq: Three times a day (TID) | INTRAVENOUS | Status: DC
  Administered 2015-10-19 – 2015-10-22 (×11): 500 mg via INTRAVENOUS
  Filled 2015-10-18 (×11): qty 100

## 2015-10-18 MED ORDER — HYDROMORPHONE HCL 1 MG/ML IJ SOLN
1.0000 mg | INTRAMUSCULAR | Status: DC | PRN
  Administered 2015-10-18 – 2015-10-19 (×4): 1 mg via INTRAVENOUS
  Filled 2015-10-18 (×5): qty 1

## 2015-10-18 MED ORDER — FLUOXETINE HCL 20 MG PO CAPS
40.0000 mg | ORAL_CAPSULE | Freq: Every day | ORAL | Status: DC
  Administered 2015-10-19 – 2015-10-23 (×5): 40 mg via ORAL
  Filled 2015-10-18 (×5): qty 2

## 2015-10-18 MED ORDER — ONDANSETRON HCL 4 MG/2ML IJ SOLN
INTRAMUSCULAR | Status: AC
  Administered 2015-10-18: 4 mg via INTRAVENOUS
  Filled 2015-10-18: qty 2

## 2015-10-18 MED ORDER — ONDANSETRON HCL 4 MG/2ML IJ SOLN
4.0000 mg | Freq: Once | INTRAMUSCULAR | Status: AC | PRN
  Administered 2015-10-18: 4 mg via INTRAVENOUS
  Filled 2015-10-18: qty 2

## 2015-10-18 MED ORDER — LEVOTHYROXINE SODIUM 112 MCG PO TABS: 112.0000 ug | ORAL_TABLET | Freq: Every day | ORAL | Status: DC

## 2015-10-18 MED ORDER — LORAZEPAM 2 MG/ML IJ SOLN
INTRAMUSCULAR | Status: AC
  Administered 2015-10-18: 1 mg via INTRAVENOUS
  Filled 2015-10-18: qty 1

## 2015-10-18 MED ORDER — CIPROFLOXACIN IN D5W 400 MG/200ML IV SOLN
400.0000 mg | Freq: Two times a day (BID) | INTRAVENOUS | Status: DC
  Administered 2015-10-19 – 2015-10-22 (×7): 400 mg via INTRAVENOUS
  Filled 2015-10-18 (×7): qty 200

## 2015-10-18 NOTE — H&P (Signed)
History and Physical  [name] ZOX:[id number] DOB: [birth year] DOA: 10/18/2015  Referring physician: Dr Jeraldine Loots, ED physician PCP: [name], MD  Outpatient Specialists: Dr [name] Laurette Schimke)  Chief Complaint: Abdominal pain, vomiting, diarrhea  HPI: [name] is a 46 y.o. female with a history of thyroid disease, diverticulitis Oma IBS. Patient seen for acute onset of abdominal pain with vomiting and diarrhea that started this morning.  Her initial symptom was left lower quadrant pain, followed by vomiting stomach contents and bile. She then began having multiple episodes of loose mucoid diarrhea. She denies bloody diarrhea or hematemesis. Her pain is briefly improved with narcotics, given in the emergency department. No provoking factors. Recent pain as severe and intermittent. Does have history of constipation.  Review of Systems:   Pt denies any fevers, chills, constipation, shortness of breath, dyspnea on exertion, orthopnea, cough, wheezing, palpitations, headache, vision changes, lightheadedness, dizziness, melena, rectal bleeding.  Review of systems are otherwise negative  Past Medical History  Diagnosis Date  . Anxiety   . IBS (irritable bowel syndrome)   . Thyroid disease   . Diverticula of intestine   . Ulcerative colitis   . Migraines   . Vertigo    Past Surgical History  Procedure Laterality Date  . Abdominal hysterectomy    . Appendectomy    . Cholecystectomy    . Knee arthroscopy     Social History:  reports that she has been smoking Cigarettes.  She has a 20 pack-year smoking history. She has never used smokeless tobacco. She reports that she does not drink alcohol or use illicit drugs. Patient lives at home  Allergies  Allergen Reactions  . Morphine And Related Itching and Rash    Family History  Problem Relation Age of Onset  . Diabetes Mother   . COPD Mother   . Heart disease Mother   . Breast cancer Paternal Grandmother   . Lung cancer  Paternal Grandfather       Prior to Admission medications   Medication Sig Start Date End Date Taking? Authorizing Provider  diazepam (VALIUM) 10 MG tablet  10/07/15  Yes Historical Provider, MD  FLUoxetine (PROZAC) 40 MG capsule TK 1 C PO QD 10/02/15  Yes Historical Provider, MD  ibuprofen (ADVIL,MOTRIN) 200 MG tablet Take 600 mg by mouth 2 (two) times daily as needed for mild pain or moderate pain. For pain   Yes Historical Provider, MD  levothyroxine (SYNTHROID, LEVOTHROID) 112 MCG tablet Take 112 mcg by mouth daily. 04/17/15  Yes Historical Provider, MD  vitamin B-12 (CYANOCOBALAMIN) 100 MCG tablet Take 100 mcg by mouth daily.   Yes Historical Provider, MD    Physical Exam: BP 111/63 mmHg  Pulse 93  Temp(Src) 98.7 F (37.1 C) (Oral)  Resp 19  Ht  (1.676 m)  Wt 63.504 kg (140 lb)  BMI 22.61 kg/m2  SpO2 97%  General: Middle-aged Caucasian female. Awake and alert and oriented x3. No acute cardiopulmonary distress.  HEENT: Normocephalic atraumatic.  Right and left ears normal in appearance.  Pupils equal, round, reactive to light. Extraocular muscles are intact. Sclerae anicteric and noninjected.  Moist mucosal membranes. No mucosal lesions.  Neck: Neck supple without lymphadenopathy. No carotid bruits. No masses palpated.  Cardiovascular: Regular rate with normal S1-S2 sounds. No murmurs, rubs, gallops auscultated. No JVD.  Respiratory: Good respiratory effort with no wheezes, rales, rhonchi. Lungs clear to auscultation bilaterally.  No accessory muscle use. Abdomen: Distended, tender in the right upper quadrant. No rebound  tenderness. Moderate guarding. Hypoactive bowel sounds. No masses or hepatosplenomegaly  Skin: No rashes, lesions, or ulcerations.  Dry, warm to touch. 2+ dorsalis pedis and radial pulses. Musculoskeletal: No calf or leg pain. All major joints not erythematous nontender.  No upper or lower joint deformation.  Good ROM.  No contractures  Psychiatric: Intact  judgment and insight. Pleasant and cooperative. Neurologic: No focal neurological deficits. Strength is 5/5 and symmetric in upper and lower extremities.  Cranial nerves II through XII are grossly intact.           Labs on Admission: I have personally reviewed following labs and imaging studies  CBC:  Recent Labs Lab 10/18/15 1230  WBC 17.4*  NEUTROABS 15.3*  HGB 14.0  HCT 40.4  MCV 92.0  PLT 231   Basic Metabolic Panel:  Recent Labs Lab 10/18/15 1230  NA 139  K 3.3*  CL 106  CO2 21*  GLUCOSE 109*  BUN 16  CREATININE 0.88  CALCIUM 9.1   GFR: Estimated Creatinine Clearance: 77.2 mL/min (by C-G formula based on Cr of 0.88). Liver Function Tests:  Recent Labs Lab 10/18/15 1230  AST 27  ALT 33  ALKPHOS 64  BILITOT 0.7  PROT 7.4  ALBUMIN 4.7   No results for input(s): LIPASE, AMYLASE in the last 168 hours. No results for input(s): AMMONIA in the last 168 hours. Coagulation Profile: No results for input(s): INR, PROTIME in the last 168 hours. Cardiac Enzymes: No results for input(s): CKTOTAL, CKMB, CKMBINDEX, TROPONINI in the last 168 hours. BNP (last 3 results) No results for input(s): PROBNP in the last 8760 hours. HbA1C: No results for input(s): HGBA1C in the last 72 hours. CBG: No results for input(s): GLUCAP in the last 168 hours. Lipid Profile: No results for input(s): CHOL, HDL, LDLCALC, TRIG, CHOLHDL, LDLDIRECT in the last 72 hours. Thyroid Function Tests: No results for input(s): TSH, T4TOTAL, FREET4, T3FREE, THYROIDAB in the last 72 hours. Anemia Panel: No results for input(s): VITAMINB12, FOLATE, FERRITIN, TIBC, IRON, RETICCTPCT in the last 72 hours. Urine analysis:    Component Value Date/Time   COLORURINE YELLOW 10/18/2015 1230   APPEARANCEUR CLEAR 10/18/2015 1230   LABSPEC 1.015 10/18/2015 1230   PHURINE 6.0 10/18/2015 1230   GLUCOSEU NEGATIVE 10/18/2015 1230   HGBUR NEGATIVE 10/18/2015 1230   BILIRUBINUR NEGATIVE 10/18/2015 1230    KETONESUR NEGATIVE 10/18/2015 1230   PROTEINUR NEGATIVE 10/18/2015 1230   UROBILINOGEN 0.2 02/28/2015 2315   NITRITE NEGATIVE 10/18/2015 1230   LEUKOCYTESUR NEGATIVE 10/18/2015 1230   Sepsis Labs: @LABRCNTIP (procalcitonin:4,lacticidven:4) )No results found for this or any previous visit (from the past 240 hour(s)).   Radiological Exams on Admission: Ct Abdomen Pelvis W Contrast  10/18/2015  CLINICAL DATA:  Abdominal pain, nausea, vomiting and diarrhea since this morning. History of IBS, ulcerative colitis, hysterectomy, appendectomy, and cholecystectomy. EXAM: CT ABDOMEN AND PELVIS WITH CONTRAST TECHNIQUE: Multidetector CT imaging of the abdomen and pelvis was performed using the standard protocol following bolus administration of intravenous contrast. CONTRAST:  100mL ISOVUE-300 IOPAMIDOL (ISOVUE-300) INJECTION 61% COMPARISON:  CT abdomen dated 03/01/2015. FINDINGS: Lower chest:  Minimal dependent atelectasis at each lung base. Hepatobiliary: Status post cholecystectomy. Probable small cyst within the right liver lobe, too small to definitively characterize, stable compared to earlier CT of 03/01/2015. Pancreas: No mass, inflammatory changes, or other significant abnormality. Spleen: Within normal limits in size and appearance. Adrenals/Urinary Tract: No masses identified. No evidence of hydronephrosis. Stomach/Bowel: At least mild thickening of the walls of the transverse  colon, descending colon and sigmoid colon. Walls of the right colon appear relatively normal. Fluid is seen throughout the majority of the nondistended colon. No large bowel or small bowel dilatation. Vascular/Lymphatic: Scattered small lymph nodes within the abdomen and pelvis. No enlarged lymph nodes. Mild atherosclerotic changes along the walls of the normal- caliber abdominal aorta. IVC is decompressed suggesting hypovolemia. Reproductive: No mass or other significant abnormality. Other: Trace free fluid in the pelvis. No abscess  collection seen. No free intraperitoneal air. No evidence of pneumatosis intestinalis. Musculoskeletal: Mild degenerative change in the lower lumbar spine, with associated mild disc bulges at the L4-5 and L5-S1 levels. No acute or suspicious osseous lesion. IMPRESSION: 1. At least mild thickening of the walls of the transverse colon, descending colon and sigmoid colon. This is almost certainly related to patient's history of ulcerative colitis, possibly acute on chronic bowel wall thickening. Fluid is present throughout the majority of the colon suggesting at least some degree of acute bowel wall inflammation. No associated bowel obstruction. 2. Small amount of free fluid in the pelvis is likely related to the adjacent bowel wall thickening/inflammation. No abscess collection. No free intraperitoneal air. 3. IVC is decompressed suggesting some degree of hypovolemia. 4. Status post cholecystectomy. 5. Mild degenerative change in the lumbar spine, as described above. Electronically Signed   By: Bary Richard M.D.   On: 10/18/2015 16:24    Assessment/Plan: Active Problems:   Colitis   This patient was discussed with the ED physician, including pertinent vitals, physical exam findings, labs, and imaging.  We also discussed care given by the ED provider.  #1 colitis  Admit  Currently appears infectious in etiology  We'll continue Flagyl and Cipro  CBC in the morning  Stool panel #2 depression  Continue home medications  DVT prophylaxis: Lovenox Consultants: none Code Status: full code Family Communication: husband  Disposition Plan: admit   Levie Heritage, DO [facility] Pager [phone]  If 7PM-7AM, please contact night-coverage [email or web address] Password [email or web address]

## 2015-10-18 NOTE — ED Notes (Signed)
MD at bedside. 

## 2015-10-18 NOTE — ED Notes (Signed)
Attempted to call report, bed not yet assigned per unit secretary. 

## 2015-10-18 NOTE — ED Notes (Signed)
Pt c/o abd pain, swelling, vomiting, and diarrhea since 0730 this morning.  Reports history of diverticulitis.

## 2015-10-18 NOTE — ED Provider Notes (Signed)
CSN: [id number]     Arrival date & time 10/18/15  1150 History  By signing my name below, I, [name], attest that this documentation has been prepared under the direction and in the presence of [name], MD.   Electronically Signed: [name], ED Scribe. 10/18/2015. 5:10 PM   Chief Complaint  Patient presents with  . Abdominal Pain    The history is provided by a parent, the spouse and the patient. No language interpreter was used.   HPI Comments: [name] is a 45 y.o. female with PMHx of diverticulitis and IBS who presents to the Emergency Department complaining of gradual onset, constant abdominal pain, ongoing for a few days, worsening last night. Mom reports associated emesis and diarrhea onset at 7:30 AM this morning. Pt states she put herself on a liquid diet earlier this week because she felt that "food was getting stuck every time she ate". No other associated symptoms noted. No worsening or alleviating factors noted. Pt denies chest pain, confusion, syncope, or any other pertinent symptoms.   Past Medical History  Diagnosis Date  . Anxiety   . IBS (irritable bowel syndrome)   . Thyroid disease   . Diverticula of intestine   . Ulcerative colitis   . Migraines   . Vertigo    Past Surgical History  Procedure Laterality Date  . Abdominal hysterectomy    . Appendectomy    . Cholecystectomy    . Knee arthroscopy     Family History  Problem Relation Age of Onset  . Diabetes Mother   . COPD Mother   . Heart disease Mother   . Breast cancer Paternal Grandmother   . Lung cancer Paternal Grandfather    Social History  Substance Use Topics  . Smoking status: Current Every Day Smoker -- 1.00 packs/day for 20 years    Types: Cigarettes  . Smokeless tobacco: Never Used  . Alcohol Use: No     Comment: socially   OB History    No data available     Review of Systems  Constitutional:       Per HPI, otherwise negative  HENT:       Per HPI,  otherwise negative  Respiratory:       Per HPI, otherwise negative  Cardiovascular:       Per HPI, otherwise negative  Gastrointestinal: Positive for nausea, vomiting, abdominal pain, diarrhea, constipation and abdominal distention.  Endocrine:       Negative aside from HPI  Genitourinary:       Neg aside from HPI   Musculoskeletal:       Per HPI, otherwise negative  Skin: Negative.   Neurological: Negative for syncope.      Allergies  Morphine and related  Home Medications   Prior to Admission medications   Medication Sig Start Date End Date Taking? Authorizing Provider  diazepam (VALIUM) 10 MG tablet  10/07/15  Yes Historical Provider, MD  FLUoxetine (PROZAC) 40 MG capsule TK 1 C PO QD 10/02/15  Yes Historical Provider, MD  ibuprofen (ADVIL,MOTRIN) 200 MG tablet Take 600 mg by mouth 2 (two) times daily as needed for mild pain or moderate pain. For pain   Yes Historical Provider, MD  levothyroxine (SYNTHROID, LEVOTHROID) 112 MCG tablet Take 112 mcg by mouth daily. 04/17/15  Yes Historical Provider, MD  vitamin B-12 (CYANOCOBALAMIN) 100 MCG tablet Take 100 mcg by mouth daily.   Yes Historical Provider, MD  albuterol (PROVENTIL HFA;VENTOLIN HFA) 108 (90 BASE)  MCG/ACT inhaler Inhale 2 puffs into the lungs every 4 (four) hours as needed for wheezing or shortness of breath. Patient not taking: Reported on 10/18/2015 11/25/13   [name], MD  ALPRAZolam Prudy Feeler) 1 MG tablet Take 1 mg by mouth 2 (two) times daily as needed. Reported on 10/18/2015    Historical Provider, MD  HYDROcodone-acetaminophen (NORCO/VICODIN) 5-325 MG per tablet Take 1 tablet by mouth every 4 (four) hours as needed. Patient not taking: Reported on 10/18/2015 03/01/15   [name], PA-C  loperamide (IMODIUM) 2 MG capsule Take 1 capsule (2 mg total) by mouth 4 (four) times daily as needed for diarrhea or loose stools. Patient not taking: Reported on 10/18/2015 04/24/15   [name], MD  ondansetron (ZOFRAN ODT) 4 MG  disintegrating tablet Take 1 tablet (4 mg total) by mouth every 8 (eight) hours as needed for nausea or vomiting. Patient not taking: Reported on 10/18/2015 04/24/15   [name], MD  promethazine (PHENERGAN) 25 MG tablet Take 1 tablet (25 mg total) by mouth every 6 (six) hours as needed for nausea or vomiting. Patient not taking: Reported on 10/18/2015 03/01/15   [name], PA-C   BP 150/134 mmHg  Pulse 116  Temp(Src) 98 F (36.7 C) (Oral)  Resp 24  Ht  (1.676 m)  Wt 140 lb (63.504 kg)  BMI 22.61 kg/m2  SpO2 100% Physical Exam  Constitutional: She is oriented to person, place, and time. She appears well-developed and well-nourished. No distress.  HENT:  Head: Normocephalic and atraumatic.  Eyes: Conjunctivae and EOM are normal.  Cardiovascular: Regular rhythm.  Tachycardia present.   Pulmonary/Chest: Effort normal and breath sounds normal. No stridor. Tachypnea noted. No respiratory distress.  Abdominal: She exhibits no distension.    Musculoskeletal: She exhibits no edema.  Neurological: She is alert and oriented to person, place, and time. No cranial nerve deficit.  Skin: Skin is warm and dry.  Psychiatric: She has a normal mood and affect.  Nursing note and vitals reviewed.   ED Course  Procedures (including critical care time) DIAGNOSTIC STUDIES: Oxygen Saturation is 100% on RA, normal by my interpretation.    COORDINATION OF CARE: 12:26 PM-Discussed treatment plan which includes CT abdomen pelvis with contrast, CBC with differential, CMP, and urinalysis with pt at bedside and pt agreed to plan.    Labs Review Labs Reviewed  CBC WITH DIFFERENTIAL/PLATELET - Abnormal; Notable for the following:    WBC 17.4 (*)    Neutro Abs 15.3 (*)    All other components within normal limits  COMPREHENSIVE METABOLIC PANEL - Abnormal; Notable for the following:    Potassium 3.3 (*)    CO2 21 (*)    Glucose, Bld 109 (*)    All other components within normal limits   URINALYSIS, ROUTINE W REFLEX MICROSCOPIC (NOT AT [facility])    Imaging Review Ct Abdomen Pelvis W Contrast  10/18/2015  CLINICAL DATA:  Abdominal pain, nausea, vomiting and diarrhea since this morning. History of IBS, ulcerative colitis, hysterectomy, appendectomy, and cholecystectomy. EXAM: CT ABDOMEN AND PELVIS WITH CONTRAST TECHNIQUE: Multidetector CT imaging of the abdomen and pelvis was performed using the standard protocol following bolus administration of intravenous contrast. CONTRAST:  ISOVUE-300 IOPAMIDOL (ISOVUE-300) INJECTION 61% COMPARISON:  CT abdomen dated 03/01/2015. FINDINGS: Lower chest:  Minimal dependent atelectasis at each lung base. Hepatobiliary: Status post cholecystectomy. Probable small cyst within the right liver lobe, too small to definitively characterize, stable compared to earlier CT of 03/01/2015. Pancreas:  No mass, inflammatory changes, or other significant abnormality. Spleen: Within normal limits in size and appearance. Adrenals/Urinary Tract: No masses identified. No evidence of hydronephrosis. Stomach/Bowel: At least mild thickening of the walls of the transverse colon, descending colon and sigmoid colon. Walls of the right colon appear relatively normal. Fluid is seen throughout the majority of the nondistended colon. No large bowel or small bowel dilatation. Vascular/Lymphatic: Scattered small lymph nodes within the abdomen and pelvis. No enlarged lymph nodes. Mild atherosclerotic changes along the walls of the normal- caliber abdominal aorta. IVC is decompressed suggesting hypovolemia. Reproductive: No mass or other significant abnormality. Other: Trace free fluid in the pelvis. No abscess collection seen. No free intraperitoneal air. No evidence of pneumatosis intestinalis. Musculoskeletal: Mild degenerative change in the lower lumbar spine, with associated mild disc bulges at the L4-5 and L5-S1 levels. No acute or suspicious osseous lesion. IMPRESSION: 1. At least  mild thickening of the walls of the transverse colon, descending colon and sigmoid colon. This is almost certainly related to patient's history of ulcerative colitis, possibly acute on chronic bowel wall thickening. Fluid is present throughout the majority of the colon suggesting at least some degree of acute bowel wall inflammation. No associated bowel obstruction. 2. Small amount of free fluid in the pelvis is likely related to the adjacent bowel wall thickening/inflammation. No abscess collection. No free intraperitoneal air. 3. IVC is decompressed suggesting some degree of hypovolemia. 4. Status post cholecystectomy. 5. Mild degenerative change in the lumbar spine, as described above. Electronically Signed   By: Bary RichardStan  Maynard M.D.   On: 10/18/2015 16:24   I have personally reviewed and evaluated these images and lab results as part of my medical decision-making.  On repeat exam the patient continues to have pain.  We discussed all findings, including concern for colitis.   MDM   I personally performed the services described in this documentation, which was scribed in my presence. The recorded information has been reviewed and is accurate.    Female with a history of inflammatory bowel disease, prior episodes of  diverticulitis presents with severe abdominal pain, nausea, vomiting, diarrhea.  here the patient requires fluids, multiple doses of narcotics, antiemetics for symptom control. Patient is found to have leukocytosis, and CT evidence of inflammatory changes in her colon, concern for colitis versus inflammatory bowel disease. Given her persistent symptoms, she was admitted for further evaluation and management.   Gerhard Munchobert Delsa Walder, MD 10/18/15 [phone]

## 2015-10-19 DIAGNOSIS — E876 Hypokalemia: Secondary | ICD-10-CM

## 2015-10-19 DIAGNOSIS — E861 Hypovolemia: Secondary | ICD-10-CM

## 2015-10-19 DIAGNOSIS — R112 Nausea with vomiting, unspecified: Secondary | ICD-10-CM

## 2015-10-19 DIAGNOSIS — E039 Hypothyroidism, unspecified: Secondary | ICD-10-CM

## 2015-10-19 DIAGNOSIS — Z720 Tobacco use: Secondary | ICD-10-CM

## 2015-10-19 LAB — CBC
HEMATOCRIT: 32.6 % — AB (ref 36.0–46.0)
Hemoglobin: 11.2 g/dL — ABNORMAL LOW (ref 12.0–15.0)
MCH: 32.3 pg (ref 26.0–34.0)
MCHC: 34.4 g/dL (ref 30.0–36.0)
MCV: 93.9 fL (ref 78.0–100.0)
PLATELETS: 157 10*3/uL (ref 150–400)
RBC: 3.47 MIL/uL — ABNORMAL LOW (ref 3.87–5.11)
RDW: 12.9 % (ref 11.5–15.5)
WBC: 8.6 10*3/uL (ref 4.0–10.5)

## 2015-10-19 LAB — C DIFFICILE QUICK SCREEN W PCR REFLEX
C DIFFICILE (CDIFF) INTERP: NEGATIVE
C DIFFICLE (CDIFF) ANTIGEN: NEGATIVE
C Diff toxin: NEGATIVE

## 2015-10-19 LAB — BASIC METABOLIC PANEL
ANION GAP: 5 (ref 5–15)
BUN: 10 mg/dL (ref 6–20)
CO2: 23 mmol/L (ref 22–32)
Calcium: 8 mg/dL — ABNORMAL LOW (ref 8.9–10.3)
Chloride: 106 mmol/L (ref 101–111)
Creatinine, Ser: 0.9 mg/dL (ref 0.44–1.00)
GFR calc Af Amer: >60 mL/min (ref >60)
GLUCOSE: 98 mg/dL (ref 65–99)
POTASSIUM: 3.5 mmol/L (ref 3.5–5.1)
Sodium: 134 mmol/L — ABNORMAL LOW (ref 135–145)

## 2015-10-19 MED ORDER — KCL IN DEXTROSE-NACL 40-5-0.9 MEQ/L-%-% IV SOLN
INTRAVENOUS | Status: DC
  Administered 2015-10-19 – 2015-10-20 (×3): via INTRAVENOUS
  Filled 2015-10-19 (×7): qty 1000

## 2015-10-19 MED ORDER — LEVOTHYROXINE SODIUM 100 MCG IV SOLR
75.0000 ug | Freq: Every day | INTRAVENOUS | Status: DC
  Administered 2015-10-19 – 2015-10-20 (×2): 75 ug via INTRAVENOUS
  Filled 2015-10-19 (×3): qty 5

## 2015-10-19 MED ORDER — ONDANSETRON HCL 4 MG/2ML IJ SOLN
4.0000 mg | Freq: Four times a day (QID) | INTRAMUSCULAR | Status: DC
  Administered 2015-10-19 – 2015-10-23 (×16): 4 mg via INTRAVENOUS
  Filled 2015-10-19 (×16): qty 2

## 2015-10-19 MED ORDER — PROCHLORPERAZINE EDISYLATE 5 MG/ML IJ SOLN
5.0000 mg | INTRAMUSCULAR | Status: DC | PRN
Start: 2015-10-19 — End: 2015-10-23
  Administered 2015-10-19 – 2015-10-23 (×7): 5 mg via INTRAVENOUS
  Filled 2015-10-19 (×7): qty 2

## 2015-10-19 MED ORDER — HYDROMORPHONE HCL 1 MG/ML IJ SOLN
1.5000 mg | INTRAMUSCULAR | Status: DC | PRN
  Administered 2015-10-19 – 2015-10-23 (×22): 1.5 mg via INTRAVENOUS
  Filled 2015-10-19 (×25): qty 2

## 2015-10-19 MED ORDER — NICOTINE 14 MG/24HR TD PT24
14.0000 mg | MEDICATED_PATCH | Freq: Every day | TRANSDERMAL | Status: DC
  Administered 2015-10-19 – 2015-10-23 (×5): 14 mg via TRANSDERMAL
  Filled 2015-10-19 (×5): qty 1

## 2015-10-19 MED ORDER — DIAZEPAM 5 MG/ML IJ SOLN
2.5000 mg | INTRAMUSCULAR | Status: DC | PRN
  Administered 2015-10-21 – 2015-10-22 (×2): 2.5 mg via INTRAVENOUS
  Filled 2015-10-19 (×2): qty 2

## 2015-10-19 NOTE — Consult Note (Signed)
Referring Provider: No ref. provider found Primary Care Physician:  Ernestine ConradBLUTH, KIRK, MD Primary Gastroenterologist:  Dr. Karilyn Cotaehman  Reason for Consultation:  Colitis  HPI: Pleasant 45 year old lady with chronic abdominal pain and constipation admitted to the hospital with acute exacerbation of generalized abdominal pain, nonbloody vomiting and some loose nonbloody stools. In the ED, she was found to have CT abnormalities consistent with a patchy colitis and leukocytosis. She's been admitted to the hospital started on IV Cipro and Flagyl. Stool studies have been sent. C. difficile has come back negative; GI pathogen panel remains pending. This morning her leukocytosis has resolved. She's feeling better than yesterday. Patient denies sick contacts. Uses NSAIDs only infrequently. There is mention in the record of ulcerative colitis however, cannot find any substantiation of that diagnosis and patient is unaware such diagnosis.  Her baseline bowel function is that of recalcitrant constipation   -  patient states she may go as long as 1-3 weeks in between bowel movements.  Has been seen by Dr. Karilyn Cotaehman previously -  lastly in 2013. Colonoscopy in 2009 for diarrhea and rectal bleeding revealed internal hemorrhoids. No evidence of colitis / biopsies negative. Distal history of alcohol-related pancreatitis. Focal gastritis on prior EGD.   Past Medical History  Diagnosis Date  . Anxiety   . IBS (irritable bowel syndrome)   . Thyroid disease   . Diverticula of intestine   . Ulcerative colitis   . Migraines   . Vertigo     Past Surgical History  Procedure Laterality Date  . Abdominal hysterectomy    . Appendectomy    . Cholecystectomy    . Knee arthroscopy      Prior to Admission medications   Medication Sig Start Date End Date Taking? Authorizing Provider  diazepam (VALIUM) 10 MG tablet  10/07/15  Yes Historical Provider, MD  FLUoxetine (PROZAC) 40 MG capsule TK 1 C PO QD 10/02/15  Yes Historical  Provider, MD  ibuprofen (ADVIL,MOTRIN) 200 MG tablet Take 600 mg by mouth 2 (two) times daily as needed for mild pain or moderate pain. For pain   Yes Historical Provider, MD  levothyroxine (SYNTHROID, LEVOTHROID) 112 MCG tablet Take 112 mcg by mouth daily. 04/17/15  Yes Historical Provider, MD  vitamin B-12 (CYANOCOBALAMIN) 100 MCG tablet Take 100 mcg by mouth daily.   Yes Historical Provider, MD    Current Facility-Administered Medications  Medication Dose Route Frequency Provider Last Rate Last Dose  . acetaminophen (TYLENOL) tablet 650 mg  650 mg Oral Q6H PRN Levie HeritageJacob J Stinson, DO       Or  . acetaminophen (TYLENOL) suppository 650 mg  650 mg Rectal Q6H PRN Rhona RaiderJacob J Stinson, DO      . ciprofloxacin (CIPRO) IVPB 400 mg  400 mg Intravenous Q12H Rhona RaiderJacob J Stinson, DO   400 mg at 10/19/15 0429  . dextrose 5 % and 0.9 % NaCl with KCl 40 mEq/L infusion   Intravenous Continuous Elliot Cousinenise Fisher, MD 125 mL/hr at 10/19/15 1011    . diazepam (VALIUM) injection 2.5 mg  2.5 mg Intravenous Q4H PRN Elliot Cousinenise Fisher, MD      . enoxaparin (LOVENOX) injection 40 mg  40 mg Subcutaneous Q24H Rhona RaiderJacob J Stinson, DO   40 mg at 10/18/15 2146  . FLUoxetine (PROZAC) capsule 40 mg  40 mg Oral Daily Rhona RaiderJacob J Stinson, DO   40 mg at 10/19/15 1006  . HYDROmorphone (DILAUDID) injection 1.5 mg  1.5 mg Intravenous Q2H PRN Elliot Cousinenise Fisher, MD  1.5 mg at 10/19/15 1005  . levothyroxine (SYNTHROID, LEVOTHROID) injection 75 mcg  75 mcg Intravenous QAC breakfast Elliot Cousin, MD   75 mcg at 10/19/15 1006  . metroNIDAZOLE (FLAGYL) IVPB 500 mg  500 mg Intravenous Q8H Rhona Raider Stinson, DO   500 mg at 10/19/15 1012  . nicotine (NICODERM CQ - dosed in mg/24 hours) patch 14 mg  14 mg Transdermal Daily Elliot Cousin, MD   14 mg at 10/19/15 1009  . ondansetron (ZOFRAN) injection 4 mg  4 mg Intravenous Q6H Elliot Cousin, MD      . prochlorperazine (COMPAZINE) injection 5 mg  5 mg Intravenous Q4H PRN Elliot Cousin, MD   5 mg at 10/19/15 1006     Allergies as of 10/18/2015 - Review Complete 10/18/2015  Allergen Reaction Noted  . Morphine and related Itching and Rash 01/19/2011    Family History  Problem Relation Age of Onset  . Diabetes Mother   . COPD Mother   . Heart disease Mother   . Breast cancer Paternal Grandmother   . Lung cancer Paternal Grandfather     Social History   Social History  . Marital Status: Married    Spouse Name: N/A  . Number of Children: N/A  . Years of Education: N/A   Occupational History  . Not on file.   Social History Main Topics  . Smoking status: Current Every Day Smoker -- 1.00 packs/day for 20 years    Types: Cigarettes  . Smokeless tobacco: Never Used  . Alcohol Use: No     Comment: socially  . Drug Use: No  . Sexual Activity: Yes    Birth Control/ Protection: Surgical   Other Topics Concern  . Not on file   Social History Narrative    Review of Systems:  as in history of present illness Gen: Denies any fever, chills, sweats, anorexia, fatigue, weakness, malaise, weight loss, and sleep disorder CV: Denies chest pain, angina, palpitations, syncope, orthopnea, PND, peripheral edema, and claudication. GI: Denies vomiting blood, jaundice, and fecal incontinence.   Denies dysphagia or odynophagia.   Physical Exam: Vital signs in last 24 hours: Temp:  [98.7 F (37.1 C)-99.8 F (37.7 C)] 99.8 F (37.7 C) (04/29 0618) Pulse Rate:  [86-108] 91 (04/29 0618) Resp:  [19-20] 20 (04/29 0618) BP: (92-112)/(48-84) 95/48 mmHg (04/29 0618) SpO2:  [92 %-100 %] 98 % (04/29 0618) Weight:  [149 lb 4.8 oz (67.722 kg)] 149 lb 4.8 oz (67.722 kg) (04/28 2034) Last BM Date: 10/18/15 General:   Alert,  pleasant and cooperative in NAD Eyes:  Sclera clear, no icterus.   Conjunctiva pink. Neck:  Supple; no masses or thyromegaly. Lungs:  Clear throughout to auscultation.   No wheezes, crackles, or rhonchi. No acute distress. Heart:  Regular rate and rhythm; no murmurs, clicks, rubs,  or  gallops. Abdomen:  Nondistended bowel sounds present somewhat infrequent. Minimal diffuse tenderness diffusely. No appreciable mass or organomegaly Extremities:  Without clubbing or edema.  Intake/Output from previous day:   Intake/Output this shift:    Lab Results:  Recent Labs  10/18/15 1230 10/19/15 0555  WBC 17.4* 8.6  HGB 14.0 11.2*  HCT 40.4 32.6*  PLT 231 157   BMET  Recent Labs  10/18/15 1230 10/19/15 0555  NA 139 134*  K 3.3* 3.5  CL 106 106  CO2 21* 23  GLUCOSE 109* 98  BUN 16 10  CREATININE 0.88 0.90  CALCIUM 9.1 8.0*   LFT  Recent Labs  10/18/15 1230  PROT 7.4  ALBUMIN 4.7  AST 27  ALT 33  ALKPHOS 64  BILITOT 0.7    Studies/Results: Ct Abdomen Pelvis W Contrast  10/18/2015  CLINICAL DATA:  Abdominal pain, nausea, vomiting and diarrhea since this morning. History of IBS, ulcerative colitis, hysterectomy, appendectomy, and cholecystectomy. EXAM: CT ABDOMEN AND PELVIS WITH CONTRAST TECHNIQUE: Multidetector CT imaging of the abdomen and pelvis was performed using the standard protocol following bolus administration of intravenous contrast. CONTRAST:  ISOVUE-300 IOPAMIDOL (ISOVUE-300) INJECTION 61% COMPARISON:  CT abdomen dated 03/01/2015. FINDINGS: Lower chest:  Minimal dependent atelectasis at each lung base. Hepatobiliary: Status post cholecystectomy. Probable small cyst within the right liver lobe, too small to definitively characterize, stable compared to earlier CT of 03/01/2015. Pancreas: No mass, inflammatory changes, or other significant abnormality. Spleen: Within normal limits in size and appearance. Adrenals/Urinary Tract: No masses identified. No evidence of hydronephrosis. Stomach/Bowel: At least mild thickening of the walls of the transverse colon, descending colon and sigmoid colon. Walls of the right colon appear relatively normal. Fluid is seen throughout the majority of the nondistended colon. No large bowel or small bowel dilatation.  Vascular/Lymphatic: Scattered small lymph nodes within the abdomen and pelvis. No enlarged lymph nodes. Mild atherosclerotic changes along the walls of the normal- caliber abdominal aorta. IVC is decompressed suggesting hypovolemia. Reproductive: No mass or other significant abnormality. Other: Trace free fluid in the pelvis. No abscess collection seen. No free intraperitoneal air. No evidence of pneumatosis intestinalis. Musculoskeletal: Mild degenerative change in the lower lumbar spine, with associated mild disc bulges at the L4-5 and L5-S1 levels. No acute or suspicious osseous lesion. IMPRESSION: 1. At least mild thickening of the walls of the transverse colon, descending colon and sigmoid colon. This is almost certainly related to patient's history of ulcerative colitis, possibly acute on chronic bowel wall thickening. Fluid is present throughout the majority of the colon suggesting at least some degree of acute bowel wall inflammation. No associated bowel obstruction. 2. Small amount of free fluid in the pelvis is likely related to the adjacent bowel wall thickening/inflammation. No abscess collection. No free intraperitoneal air. 3. IVC is decompressed suggesting some degree of hypovolemia. 4. Status post cholecystectomy. 5. Mild degenerative change in the lumbar spine, as described above. Electronically Signed   By: Bary Richard M.D.   On: 10/18/2015 16:24   Impression:  Pleasant 45 year old lady with long-standing chronic abdominal pain / constipation  admitted to the hospital with acute onset generalized abdominal pain with nausea and vomiting yesterday. CT demonstrated changes consistent with patchy inflammation of the colon.  Some loose stools the day of admission. No blood per rectum.  Leukocytosis on admission  -  resolved on CBC today. Hemoglobin has gone from 14-11.2. IV Cipro and Flagyl ongoing therapy.  Does not appear toxic at this time.   There is no substantiated history of ulcerative  colitis in the old medical record reviewed here.  I suspect the patient most likely developed acute gastroenteritis from an infectious process. C. difficile toxin assay has come back negative. GI pathogen panel pending.   Recommendations:  For now, continue IV Cipro and Flagyl,, supportive measures as you are doing. Clear liquid diet.  Follow up on the pending studies.  Further recommendations to follow.      Notice:  This dictation was prepared with Dragon dictation along with smaller phrase technology. Any transcriptional errors that result from this process are unintentional and may not be corrected upon review.

## 2015-10-19 NOTE — Progress Notes (Signed)
PROGRESS NOTE    [name]  ZOX:[id number] DOB: [birth year] DOA: 10/18/2015 PCP: Alyssa Conrad, MD    Outpatient Specialists:Dr [name])    Brief Narrative:  Patient is a 45 year old woman with a history of diverticulitis, IBS, hypothyroidism, and tobacco use, who presented to the emergency department on 10/18/2015 with a chief complaint of abdominal pain, nausea, vomiting, and diarrhea. In the ED, CT scan of her abdomen and pelvis revealed mild thickening of the walls of the transverse colon, descending colon, and sigmoid colon; small amount of fluid in the pelvis; depressed IVC suggesting hypovolemia; status post cholecystectomy. She was admitted for further evaluation and management of extensive colitis.   Assessment & Plan:   Active Problems:   Tobacco abuse   Colitis   Hypokalemia   Hypothyroidism, adult   1. Diffuse colitis with associated vomiting, diarrhea, and abdominal pain. -Patient was started on IV fluids, Cipro, and Flagyl. GI pathogen panel was ordered. C. difficile was added for quicker results. -Due to the patient's diffuse colitis, will consult GI. -IV Dilaudid dosing increased. -Continue nothing by mouth status, but will try to decrease her pill burden.  Hypovolemia. IV fluids restarted with potassium and dextrose added.  Hypokalemia. Patient serum potassium 3.3 on admission. It has improved to 3.5 following supplementation. We'll continue potassium chloride in IV fluids. Etiology likely from diarrhea.  Tobacco abuse. Patient was instructed to stop smoking. Nicotine patch ordered.  Depression with anxiety. -Prozac is being continued by mouth, but will give diazepam as needed IV.  Hypothyroidism. She was continued on Synthroid, but it was changed to IV until she is no longer nothing by mouth. We'll order a TSH and other labs for tomorrow.     DVT prophylaxis: Lovenox Code Status: Full code Family Communication: Discussed with  husband Disposition Plan: Discharged home when clinically appropriate, likely in a few days   Consultants:   Gastroenterology  Procedures:    None  Antimicrobials:   Cipro 10/18/15>>  Flagyl 10/18/15>>   Subjective: Patient complains of 10 over 10 abdominal pain "all over". Her last bowel movement was early this morning without evidence of blood.  Objective: Filed Vitals:   10/18/15 1830 10/18/15 1915 10/18/15 2034 10/19/15 0618  BP: 98/58 111/63 104/56 95/48  Pulse: 93 93 100 91  Temp:  98.7 F (37.1 C) 99 F (37.2 C) 99.8 F (37.7 C)  TempSrc:  Oral Oral Oral  Resp:  Height:    (1.676 m)   Weight:   67.722 kg (149 lb 4.8 oz)   SpO2: 95% 97% 99% 98%   No intake or output data in the 24 hours ending 10/19/15 0921 Filed Weights   10/18/15 1154 10/18/15 2034  Weight: 63.504 kg (140 lb) 67.722 kg (149 lb 4.8 oz)    Examination:  General exam: 45 year old woman who appears ill and in pain.  Respiratory system: Clear to auscultation. Respiratory effort normal. Cardiovascular system: S1 & S2 heard, RRR. No JVD, murmurs, rubs, gallops or clicks. No pedal edema. Gastrointestinal system: Abdomen is distended, hypoactive bowel sounds, diffusely moderately tender; some voluntary guarding. Central nervous system: Alert and oriented. No focal neurological deficits. Extremities: Symmetric 5 x 5 power. Skin: No rashes, lesions or ulcers Psychiatry: Judgement and insight appear normal. Mood & affect appropriate.     Data Reviewed: I have personally reviewed following labs and imaging studies  CBC:  Recent Labs Lab 10/18/15 1230 10/19/15 0555  WBC 17.4* 8.6  NEUTROABS 15.3*  --  HGB 14.0 11.2*  HCT 40.4 32.6*  MCV 92.0 93.9  PLT 231 157   Basic Metabolic Panel:  Recent Labs Lab 10/18/15 1230 10/19/15 0555  NA 139 134*  K 3.3* 3.5  CL 106 106  CO2 21* 23  GLUCOSE 109* 98  BUN 16 10  CREATININE 0.88 0.90  CALCIUM 9.1 8.0*    GFR: Estimated Creatinine Clearance: 75.5 mL/min (by C-G formula based on Cr of 0.9). Liver Function Tests:  Recent Labs Lab 10/18/15 1230  AST 27  ALT 33  ALKPHOS 64  BILITOT 0.7  PROT 7.4  ALBUMIN 4.7   No results for input(s): LIPASE, AMYLASE in the last 168 hours. No results for input(s): AMMONIA in the last 168 hours. Coagulation Profile: No results for input(s): INR, PROTIME in the last 168 hours. Cardiac Enzymes: No results for input(s): CKTOTAL, CKMB, CKMBINDEX, TROPONINI in the last 168 hours. BNP (last 3 results) No results for input(s): PROBNP in the last 8760 hours. HbA1C: No results for input(s): HGBA1C in the last 72 hours. CBG: No results for input(s): GLUCAP in the last 168 hours. Lipid Profile: No results for input(s): CHOL, HDL, LDLCALC, TRIG, CHOLHDL, LDLDIRECT in the last 72 hours. Thyroid Function Tests: No results for input(s): TSH, T4TOTAL, FREET4, T3FREE, THYROIDAB in the last 72 hours. Anemia Panel: No results for input(s): VITAMINB12, FOLATE, FERRITIN, TIBC, IRON, RETICCTPCT in the last 72 hours. Urine analysis:    Component Value Date/Time   COLORURINE YELLOW 10/18/2015 1230   APPEARANCEUR CLEAR 10/18/2015 1230   LABSPEC 1.015 10/18/2015 1230   PHURINE 6.0 10/18/2015 1230   GLUCOSEU NEGATIVE 10/18/2015 1230   HGBUR NEGATIVE 10/18/2015 1230   BILIRUBINUR NEGATIVE 10/18/2015 1230   KETONESUR NEGATIVE 10/18/2015 1230   PROTEINUR NEGATIVE 10/18/2015 1230   UROBILINOGEN 0.2 02/28/2015 2315   NITRITE NEGATIVE 10/18/2015 1230   LEUKOCYTESUR NEGATIVE 10/18/2015 1230   Sepsis Labs: @LABRCNTIP (procalcitonin:4,lacticidven:4)  )No results found for this or any previous visit (from the past 240 hour(s)).       Radiology Studies: Ct Abdomen Pelvis W Contrast  10/18/2015  CLINICAL DATA:  Abdominal pain, nausea, vomiting and diarrhea since this morning. History of IBS, ulcerative colitis, hysterectomy, appendectomy, and cholecystectomy. EXAM:  CT ABDOMEN AND PELVIS WITH CONTRAST TECHNIQUE: Multidetector CT imaging of the abdomen and pelvis was performed using the standard protocol following bolus administration of intravenous contrast. CONTRAST:  100mL ISOVUE-300 IOPAMIDOL (ISOVUE-300) INJECTION 61% COMPARISON:  CT abdomen dated 03/01/2015. FINDINGS: Lower chest:  Minimal dependent atelectasis at each lung base. Hepatobiliary: Status post cholecystectomy. Probable small cyst within the right liver lobe, too small to definitively characterize, stable compared to earlier CT of 03/01/2015. Pancreas: No mass, inflammatory changes, or other significant abnormality. Spleen: Within normal limits in size and appearance. Adrenals/Urinary Tract: No masses identified. No evidence of hydronephrosis. Stomach/Bowel: At least mild thickening of the walls of the transverse colon, descending colon and sigmoid colon. Walls of the right colon appear relatively normal. Fluid is seen throughout the majority of the nondistended colon. No large bowel or small bowel dilatation. Vascular/Lymphatic: Scattered small lymph nodes within the abdomen and pelvis. No enlarged lymph nodes. Mild atherosclerotic changes along the walls of the normal- caliber abdominal aorta. IVC is decompressed suggesting hypovolemia. Reproductive: No mass or other significant abnormality. Other: Trace free fluid in the pelvis. No abscess collection seen. No free intraperitoneal air. No evidence of pneumatosis intestinalis. Musculoskeletal: Mild degenerative change in the lower lumbar spine, with associated mild disc bulges at the L4-5  and L5-S1 levels. No acute or suspicious osseous lesion. IMPRESSION: 1. At least mild thickening of the walls of the transverse colon, descending colon and sigmoid colon. This is almost certainly related to patient's history of ulcerative colitis, possibly acute on chronic bowel wall thickening. Fluid is present throughout the majority of the colon suggesting at least some  degree of acute bowel wall inflammation. No associated bowel obstruction. 2. Small amount of free fluid in the pelvis is likely related to the adjacent bowel wall thickening/inflammation. No abscess collection. No free intraperitoneal air. 3. IVC is decompressed suggesting some degree of hypovolemia. 4. Status post cholecystectomy. 5. Mild degenerative change in the lumbar spine, as described above. Electronically Signed   By: Bary Richard M.D.   On: 10/18/2015 16:24        Scheduled Meds: . ciprofloxacin  400 mg Intravenous Q12H  . enoxaparin (LOVENOX) injection  40 mg Subcutaneous Q24H  . FLUoxetine  40 mg Oral Daily  . levothyroxine  112 mcg Oral QAC breakfast  . metronidazole  500 mg Intravenous Q8H  . vitamin B-12  100 mcg Oral Daily   Continuous Infusions:    LOS: 1 day    Time spent:35 minutes    Elliot Cousin, MD [facility] Pager [phone]  If 7PM-7AM, please contact night-coverage [email or web address] Password [facility] 10/19/2015, 9:21 AM

## 2015-10-20 DIAGNOSIS — K529 Noninfective gastroenteritis and colitis, unspecified: Secondary | ICD-10-CM

## 2015-10-20 LAB — GASTROINTESTINAL PANEL BY PCR, STOOL (REPLACES STOOL CULTURE)
Adenovirus F40/41: NOT DETECTED
Astrovirus: NOT DETECTED
CRYPTOSPORIDIUM: NOT DETECTED
Campylobacter species: NOT DETECTED
Cyclospora cayetanensis: NOT DETECTED
E. COLI O157: NOT DETECTED
ENTAMOEBA HISTOLYTICA: NOT DETECTED
Enteroaggregative E coli (EAEC): NOT DETECTED
Enteropathogenic E coli (EPEC): NOT DETECTED
Enterotoxigenic E coli (ETEC): NOT DETECTED
Giardia lamblia: NOT DETECTED
NOROVIRUS GI/GII: NOT DETECTED
PLESIMONAS SHIGELLOIDES: NOT DETECTED
Rotavirus A: NOT DETECTED
SALMONELLA SPECIES: NOT DETECTED
SAPOVIRUS (I, II, IV, AND V): NOT DETECTED
SHIGELLA/ENTEROINVASIVE E COLI (EIEC): NOT DETECTED
Shiga like toxin producing E coli (STEC): NOT DETECTED
VIBRIO CHOLERAE: NOT DETECTED
Vibrio species: NOT DETECTED
YERSINIA ENTEROCOLITICA: NOT DETECTED

## 2015-10-20 LAB — BASIC METABOLIC PANEL
Anion gap: 5 (ref 5–15)
BUN: 7 mg/dL (ref 6–20)
CHLORIDE: 108 mmol/L (ref 101–111)
CO2: 23 mmol/L (ref 22–32)
Calcium: 8.4 mg/dL — ABNORMAL LOW (ref 8.9–10.3)
Creatinine, Ser: 0.91 mg/dL (ref 0.44–1.00)
GFR calc non Af Amer: >60 mL/min (ref >60)
Glucose, Bld: 134 mg/dL — ABNORMAL HIGH (ref 65–99)
POTASSIUM: 4.2 mmol/L (ref 3.5–5.1)
SODIUM: 136 mmol/L (ref 135–145)

## 2015-10-20 LAB — CBC
HEMATOCRIT: 31.1 % — AB (ref 36.0–46.0)
Hemoglobin: 10.7 g/dL — ABNORMAL LOW (ref 12.0–15.0)
MCH: 32.6 pg (ref 26.0–34.0)
MCHC: 34.4 g/dL (ref 30.0–36.0)
MCV: 94.8 fL (ref 78.0–100.0)
Platelets: 146 10*3/uL — ABNORMAL LOW (ref 150–400)
RBC: 3.28 MIL/uL — AB (ref 3.87–5.11)
RDW: 13.1 % (ref 11.5–15.5)
WBC: 6.6 10*3/uL (ref 4.0–10.5)

## 2015-10-20 LAB — TSH: TSH: 0.417 u[IU]/mL (ref 0.350–4.500)

## 2015-10-20 MED ORDER — LEVOTHYROXINE SODIUM 112 MCG PO TABS
112.0000 ug | ORAL_TABLET | Freq: Every day | ORAL | Status: DC
  Administered 2015-10-21 – 2015-10-23 (×3): 112 ug via ORAL
  Filled 2015-10-20 (×3): qty 1

## 2015-10-20 MED ORDER — SODIUM CHLORIDE 0.9 % IV BOLUS (SEPSIS)
250.0000 mL | Freq: Once | INTRAVENOUS | Status: AC
  Administered 2015-10-20: 250 mL via INTRAVENOUS

## 2015-10-20 MED ORDER — POTASSIUM CHLORIDE IN NACL 20-0.9 MEQ/L-% IV SOLN
INTRAVENOUS | Status: DC
  Administered 2015-10-20 – 2015-10-23 (×5): via INTRAVENOUS

## 2015-10-20 NOTE — Progress Notes (Signed)
Patient feels much better today. Denies nausea vomiting, diarrhea. Abdominal pain lessened significantly from yesterday. White count 6.6 today. C. difficile negative. GI pathogen panel remains pending.   Vital signs in last 24 hours: Temp:  [99 F (37.2 C)-102.6 F (39.2 C)] 99 F (37.2 C) (04/30 0854) Pulse Rate:  [73-103] 80 (04/30 0854) Resp:  [18-24] 18 (04/30 0854) BP: (81-123)/(43-79) 109/57 mmHg (04/30 0854) SpO2:  [93 %-99 %] 99 % (04/30 0854) Last BM Date: 10/18/15 General:   Cheerful. pleasant and cooperative in NAD Abdomen:  Flat positive bowel sounds soft very minimal diffuse tenderness to palpation. Extremities:  Without clubbing or edema.    Intake/Output from previous day: 04/29 0701 - 04/30 0700 In: 0  Out: 3 [Urine:3] Intake/Output this shift: Total I/O In: -  Out: 2 [Urine:2]  Lab Results:  Recent Labs  10/18/15 1230 10/19/15 0555 10/20/15 0634  WBC 17.4* 8.6 6.6  HGB 14.0 11.2* 10.7*  HCT 40.4 32.6* 31.1*  PLT 231 157 146*   BMET  Recent Labs  10/18/15 1230 10/19/15 0555 10/20/15 0634  NA 139 134* 136  K 3.3* 3.5 4.2  CL 106 106 108  CO2 21* 23 23  GLUCOSE 109* 98 134*  BUN 16 10 7   CREATININE 0.88 0.90 0.91  CALCIUM 9.1 8.0* 8.4*   LFT  Recent Labs  10/18/15 1230  PROT 7.4  ALBUMIN 4.7  AST 27  ALT 33  ALKPHOS 64  BILITOT 0.7   PT/INR No results for input(s): LABPROT, INR in the last 72 hours. Hepatitis Panel No results for input(s): HEPBSAG, HCVAB, HEPAIGM, HEPBIGM in the last 72 hours. C-Diff  Recent Labs  10/19/15 0700  CDIFFTOX NEGATIVE    Studies/Results: Ct Abdomen Pelvis W Contrast  10/18/2015  CLINICAL DATA:  Abdominal pain, nausea, vomiting and diarrhea since this morning. History of IBS, ulcerative colitis, hysterectomy, appendectomy, and cholecystectomy. EXAM: CT ABDOMEN AND PELVIS WITH CONTRAST TECHNIQUE: Multidetector CT imaging of the abdomen and pelvis was performed using the standard protocol  following bolus administration of intravenous contrast. CONTRAST:  100mL ISOVUE-300 IOPAMIDOL (ISOVUE-300) INJECTION 61% COMPARISON:  CT abdomen dated 03/01/2015. FINDINGS: Lower chest:  Minimal dependent atelectasis at each lung base. Hepatobiliary: Status post cholecystectomy. Probable small cyst within the right liver lobe, too small to definitively characterize, stable compared to earlier CT of 03/01/2015. Pancreas: No mass, inflammatory changes, or other significant abnormality. Spleen: Within normal limits in size and appearance. Adrenals/Urinary Tract: No masses identified. No evidence of hydronephrosis. Stomach/Bowel: At least mild thickening of the walls of the transverse colon, descending colon and sigmoid colon. Walls of the right colon appear relatively normal. Fluid is seen throughout the majority of the nondistended colon. No large bowel or small bowel dilatation. Vascular/Lymphatic: Scattered small lymph nodes within the abdomen and pelvis. No enlarged lymph nodes. Mild atherosclerotic changes along the walls of the normal- caliber abdominal aorta. IVC is decompressed suggesting hypovolemia. Reproductive: No mass or other significant abnormality. Other: Trace free fluid in the pelvis. No abscess collection seen. No free intraperitoneal air. No evidence of pneumatosis intestinalis. Musculoskeletal: Mild degenerative change in the lower lumbar spine, with associated mild disc bulges at the L4-5 and L5-S1 levels. No acute or suspicious osseous lesion. IMPRESSION: 1. At least mild thickening of the walls of the transverse colon, descending colon and sigmoid colon. This is almost certainly related to patient's history of ulcerative colitis, possibly acute on chronic bowel wall thickening. Fluid is present throughout the majority of the colon suggesting  at least some degree of acute bowel wall inflammation. No associated bowel obstruction. 2. Small amount of free fluid in the pelvis is likely related to the  adjacent bowel wall thickening/inflammation. No abscess collection. No free intraperitoneal air. 3. IVC is decompressed suggesting some degree of hypovolemia. 4. Status post cholecystectomy. 5. Mild degenerative change in the lumbar spine, as described above. Electronically Signed   By: Bary Richard M.D.   On: 10/18/2015 16:24   Impression:  Presentation most consistent with an infectious gastroenteritis - much improved with antibiotics             GI pathogen panel remains pending.  Recommendations: Continue IV and Cipro for 1 more day; hopefully, transition to oral therapy tomorrow morning.              Advance to a low-fat diet today.              Dr. [name] will reassess tomorrow morning.

## 2015-10-20 NOTE — Progress Notes (Addendum)
PROGRESS NOTE    [name]  ZOX:[id number] DOB: [birth year] DOA: 10/18/2015 PCP: [name], MD    Outpatient Specialists:Dr [name])    Brief Narrative:  Patient is a 45 year old woman with a history of diverticulitis, IBS, hypothyroidism, and tobacco use, who presented to the emergency department on 10/18/2015 with a chief complaint of abdominal pain, nausea, vomiting, and diarrhea. In the ED, CT scan of her abdomen and pelvis revealed mild thickening of the walls of the transverse colon, descending colon, and sigmoid colon; small amount of fluid in the pelvis; depressed IVC suggesting hypovolemia; status post cholecystectomy. She was admitted for further evaluation and management of extensive colitis.   Assessment & Plan:   Principal Problem:   Colitis Active Problems:   Tobacco abuse   Hypokalemia   Hypothyroidism, adult   Hypovolemia   1. Diffuse colitis with associated vomiting, diarrhea, and abdominal pain. -Patient was started on IV fluids, Cipro, and Flagyl. GI pathogen panel was ordered. Patient was made to be virtually nothing by mouth. Zofran changed to scheduled every 6 hours. IV Dilaudid was ordered and titrated up for pain. C. difficile was added for quicker results. It was negative. GI pathogen panel is still pending. Gastroenterology consulted. Dr. [name] provided the assessment and recommendations which was appreciated. -Patient's pain and abdominal distention has subsided. Her white blood cell count has normalized, but she is still intermittently febrile. - Her diet is being advanced by GI.  Hypovolemia. IV fluids were restarted with potassium and dextrose added. Now that her diet has been advised, will discontinue dextrose in the IV fluids.  Hypokalemia. Patient serum potassium 3.3 on admission. It has improved to 3.5 following supplementation. She was continued on IV fluids with potassium chloride added. Her potassium continues to improve.  Tobacco  abuse. Patient was instructed to stop smoking. Nicotine patch was ordered ordered.  Depression with anxiety. -Prozac was continued by mouth. Diazepam was changed to IV as needed. Continue current management.   Hypothyroidism. She was continued on Synthroid, but it was changed to IV when she was nothing by mouth. Her TSH was within normal limits. -We'll change Synthroid back to by mouth.  Anemia and thrombocytopenia. Her counts have decreased, likely from the dilutional effects of IV fluids. We'll continue to monitor.     DVT prophylaxis: Lovenox Code Status: Full code Family Communication: Discussed with husband Disposition Plan: Discharged home when clinically appropriate, likely in a few days   Consultants:   Gastroenterology  Procedures:    None  Antimicrobials:   Cipro 10/18/15>>  Flagyl 10/18/15>>   Subjective: Patient says that she feels much better, less abdominal pain, no vomiting. She had 2 loose bowel movements early this morning.  Objective: Filed Vitals:   10/19/15 2106 10/19/15 2227 10/20/15 0448 10/20/15 0854  BP:  86/46 81/43 109/57  Pulse:  80 73 80  Temp:  100.1 F (37.8 C) 100.4 F (38 C) 99 F (37.2 C)  TempSrc:  Other (Comment) Oral Oral  Resp:  Height:      Weight:      SpO2: 95% 93% 94% 99%    Intake/Output Summary (Last 24 hours) at 10/20/15 1243 Last data filed at 10/20/15 1000  Gross per 24 hour  Intake      0 ml  Output      2 ml  Net     -2 ml   Filed Weights   10/18/15 1154 10/18/15 2034  Weight: 63.504 kg (140 lb)  67.722 kg (149 lb 4.8 oz)    Examination:  General exam: 45 year old woman In no acute distress. Respiratory system: Clear to auscultation. Respiratory effort normal. Cardiovascular system: S1 & S2 heard, RRR. No JVD, murmurs, rubs, gallops or clicks. No pedal edema. Gastrointestinal system: Abdomen is soft with resolution of distention; mild tenderness diffusely. Central nervous system: Alert and  oriented. No focal neurological deficits. Extremities: Symmetric 5 x 5 power. Skin: No rashes, lesions or ulcers Psychiatry: Judgement and insight appear normal. Mood & affect appropriate.     Data Reviewed: I have personally reviewed following labs and imaging studies  CBC:  Recent Labs Lab 10/18/15 1230 10/19/15 0555 10/20/15 0634  WBC 17.4* 8.6 6.6  NEUTROABS 15.3*  --   --   HGB 14.0 11.2* 10.7*  HCT 40.4 32.6* 31.1*  MCV 92.0 93.9 94.8  PLT 231 157 146*   Basic Metabolic Panel:  Recent Labs Lab 10/18/15 1230 10/19/15 0555 10/20/15 0634  NA 139 134* 136  K 3.3* 3.5 4.2  CL 106 106 108  CO2 21* 23 23  GLUCOSE 109* 98 134*  BUN 16 10 7   CREATININE 0.88 0.90 0.91  CALCIUM 9.1 8.0* 8.4*   GFR: Estimated Creatinine Clearance: 74.6 mL/min (by C-G formula based on Cr of 0.91). Liver Function Tests:  Recent Labs Lab 10/18/15 1230  AST 27  ALT 33  ALKPHOS 64  BILITOT 0.7  PROT 7.4  ALBUMIN 4.7   No results for input(s): LIPASE, AMYLASE in the last 168 hours. No results for input(s): AMMONIA in the last 168 hours. Coagulation Profile: No results for input(s): INR, PROTIME in the last 168 hours. Cardiac Enzymes: No results for input(s): CKTOTAL, CKMB, CKMBINDEX, TROPONINI in the last 168 hours. BNP (last 3 results) No results for input(s): PROBNP in the last 8760 hours. HbA1C: No results for input(s): HGBA1C in the last 72 hours. CBG: No results for input(s): GLUCAP in the last 168 hours. Lipid Profile: No results for input(s): CHOL, HDL, LDLCALC, TRIG, CHOLHDL, LDLDIRECT in the last 72 hours. Thyroid Function Tests:  Recent Labs  10/20/15 0634  TSH 0.417   Anemia Panel: No results for input(s): VITAMINB12, FOLATE, FERRITIN, TIBC, IRON, RETICCTPCT in the last 72 hours. Urine analysis:    Component Value Date/Time   COLORURINE YELLOW 10/18/2015 1230   APPEARANCEUR CLEAR 10/18/2015 1230   LABSPEC 1.015 10/18/2015 1230   PHURINE 6.0 10/18/2015  1230   GLUCOSEU NEGATIVE 10/18/2015 1230   HGBUR NEGATIVE 10/18/2015 1230   BILIRUBINUR NEGATIVE 10/18/2015 1230   KETONESUR NEGATIVE 10/18/2015 1230   PROTEINUR NEGATIVE 10/18/2015 1230   UROBILINOGEN 0.2 02/28/2015 2315   NITRITE NEGATIVE 10/18/2015 1230   LEUKOCYTESUR NEGATIVE 10/18/2015 1230   Sepsis Labs: @LABRCNTIP (procalcitonin:4,lacticidven:4)  ) Recent Results (from the past 240 hour(s))  C difficile quick scan w PCR reflex     Status: None   Collection Time: 10/19/15  7:00 AM  Result Value Ref Range Status   C Diff antigen NEGATIVE NEGATIVE Final   C Diff toxin NEGATIVE NEGATIVE Final   C Diff interpretation Negative for toxigenic C. difficile  Final         Radiology Studies: Ct Abdomen Pelvis W Contrast  10/18/2015  CLINICAL DATA:  Abdominal pain, nausea, vomiting and diarrhea since this morning. History of IBS, ulcerative colitis, hysterectomy, appendectomy, and cholecystectomy. EXAM: CT ABDOMEN AND PELVIS WITH CONTRAST TECHNIQUE: Multidetector CT imaging of the abdomen and pelvis was performed using the standard protocol following bolus administration of intravenous  contrast. CONTRAST:  ISOVUE-300 IOPAMIDOL (ISOVUE-300) INJECTION 61% COMPARISON:  CT abdomen dated 03/01/2015. FINDINGS: Lower chest:  Minimal dependent atelectasis at each lung base. Hepatobiliary: Status post cholecystectomy. Probable small cyst within the right liver lobe, too small to definitively characterize, stable compared to earlier CT of 03/01/2015. Pancreas: No mass, inflammatory changes, or other significant abnormality. Spleen: Within normal limits in size and appearance. Adrenals/Urinary Tract: No masses identified. No evidence of hydronephrosis. Stomach/Bowel: At least mild thickening of the walls of the transverse colon, descending colon and sigmoid colon. Walls of the right colon appear relatively normal. Fluid is seen throughout the majority of the nondistended colon. No large bowel or  small bowel dilatation. Vascular/Lymphatic: Scattered small lymph nodes within the abdomen and pelvis. No enlarged lymph nodes. Mild atherosclerotic changes along the walls of the normal- caliber abdominal aorta. IVC is decompressed suggesting hypovolemia. Reproductive: No mass or other significant abnormality. Other: Trace free fluid in the pelvis. No abscess collection seen. No free intraperitoneal air. No evidence of pneumatosis intestinalis. Musculoskeletal: Mild degenerative change in the lower lumbar spine, with associated mild disc bulges at the L4-5 and L5-S1 levels. No acute or suspicious osseous lesion. IMPRESSION: 1. At least mild thickening of the walls of the transverse colon, descending colon and sigmoid colon. This is almost certainly related to patient's history of ulcerative colitis, possibly acute on chronic bowel wall thickening. Fluid is present throughout the majority of the colon suggesting at least some degree of acute bowel wall inflammation. No associated bowel obstruction. 2. Small amount of free fluid in the pelvis is likely related to the adjacent bowel wall thickening/inflammation. No abscess collection. No free intraperitoneal air. 3. IVC is decompressed suggesting some degree of hypovolemia. 4. Status post cholecystectomy. 5. Mild degenerative change in the lumbar spine, as described above. Electronically Signed   By: Bary Richard M.D.   On: 10/18/2015 16:24        Scheduled Meds: . ciprofloxacin  400 mg Intravenous Q12H  . enoxaparin (LOVENOX) injection  40 mg Subcutaneous Q24H  . FLUoxetine  40 mg Oral Daily  . levothyroxine  75 mcg Intravenous QAC breakfast  . metronidazole  500 mg Intravenous Q8H  . nicotine  14 mg Transdermal Daily  . ondansetron (ZOFRAN) IV  4 mg Intravenous Q6H   Continuous Infusions: . dextrose 5 % and 0.9 % NaCl with KCl 40 mEq/L 125 mL/hr at 10/20/15 0612     LOS: 2 days    Time spent:30 minutes    Elliot Cousin, MD [facility] Pager [phone]  If 7PM-7AM, please contact night-coverage [email or web address] Password [facility] 10/20/2015, 12:43 PM

## 2015-10-21 DIAGNOSIS — D696 Thrombocytopenia, unspecified: Secondary | ICD-10-CM | POA: Diagnosis not present

## 2015-10-21 DIAGNOSIS — K529 Noninfective gastroenteritis and colitis, unspecified: Secondary | ICD-10-CM

## 2015-10-21 DIAGNOSIS — R1310 Dysphagia, unspecified: Secondary | ICD-10-CM

## 2015-10-21 DIAGNOSIS — K5904 Chronic idiopathic constipation: Secondary | ICD-10-CM

## 2015-10-21 LAB — BASIC METABOLIC PANEL
ANION GAP: 8 (ref 5–15)
BUN: 6 mg/dL (ref 6–20)
CALCIUM: 8.5 mg/dL — AB (ref 8.9–10.3)
CO2: 21 mmol/L — AB (ref 22–32)
CREATININE: 0.73 mg/dL (ref 0.44–1.00)
Chloride: 110 mmol/L (ref 101–111)
GFR calc Af Amer: >60 mL/min (ref >60)
GLUCOSE: 127 mg/dL — AB (ref 65–99)
Potassium: 4.4 mmol/L (ref 3.5–5.1)
Sodium: 139 mmol/L (ref 135–145)

## 2015-10-21 LAB — CBC
HEMATOCRIT: 30.2 % — AB (ref 36.0–46.0)
Hemoglobin: 10.5 g/dL — ABNORMAL LOW (ref 12.0–15.0)
MCH: 32.5 pg (ref 26.0–34.0)
MCHC: 34.8 g/dL (ref 30.0–36.0)
MCV: 93.5 fL (ref 78.0–100.0)
PLATELETS: 134 10*3/uL — AB (ref 150–400)
RBC: 3.23 MIL/uL — ABNORMAL LOW (ref 3.87–5.11)
RDW: 12.6 % (ref 11.5–15.5)
WBC: 5.6 10*3/uL (ref 4.0–10.5)

## 2015-10-21 MED ORDER — PANTOPRAZOLE SODIUM 40 MG IV SOLR
40.0000 mg | Freq: Two times a day (BID) | INTRAVENOUS | Status: DC
  Administered 2015-10-21: 40 mg via INTRAVENOUS
  Filled 2015-10-21 (×2): qty 40

## 2015-10-21 MED ORDER — PANTOPRAZOLE SODIUM 40 MG IV SOLR
40.0000 mg | INTRAVENOUS | Status: DC
  Administered 2015-10-21: 40 mg via INTRAVENOUS
  Filled 2015-10-21: qty 40

## 2015-10-21 NOTE — Progress Notes (Signed)
PROGRESS NOTE    [name]  WUJ:[id number] DOB: [birth year] DOA: 10/18/2015 PCP: [name], MD    Outpatient Specialists:Dr [name])    Brief Narrative:  Patient is a 45 year old woman with a history of diverticulitis, IBS, hypothyroidism, and tobacco use, who presented to the emergency department on 10/18/2015 with a chief complaint of abdominal pain, nausea, vomiting, and diarrhea. In the ED, CT scan of her abdomen and pelvis revealed mild thickening of the walls of the transverse colon, descending colon, and sigmoid colon; small amount of fluid in the pelvis; depressed IVC suggesting hypovolemia; status post cholecystectomy. She was admitted for further evaluation and management of extensive colitis.   Assessment & Plan:   Principal Problem:   Colitis Active Problems:   Tobacco abuse   Hypokalemia   Hypothyroidism, adult   Hypovolemia   1. Diffuse colitis with associated vomiting, diarrhea, and abdominal pain. -Patient was started on IV fluids, Cipro, and Flagyl. GI pathogen panel was ordered. Patient was made to be virtually nothing by mouth. Zofran changed to scheduled every 6 hours. IV Dilaudid was ordered and titrated up for pain. C. difficile was added for quicker results. It was negative. GI pathogen panel was negative. Gastroenterology consulted. Dr. [name] provided the initial assessment and recommendations which was appreciated. -Patient's pain and abdominal distention did subside, but with advancement of her diet, she has a bit more abdominal pain and had an episode of emesis this morning. She is now afebrile and her white blood cell count has normalized. - We'll downgrade her diet to full liquids. Will add Protonix IV in light of the negative GI pathogen panel. Continue antiemetics scheduled and as needed. -GI, Dr. [name] will provide the follow-up recommendations.  Hypovolemia. She was started on vigorous IV fluids. Her blood pressure has been on the  low-normal side, but she says that her blood pressures do run on the lower than normal side chronically. She has been virtually asymptomatic with her systolic blood pressure decreased into the 80s. -We'll continue IV fluid hydration.  Hypokalemia. Patient serum potassium 3.3 on admission. It has improved to 3.5 following supplementation. She was continued on IV fluids with potassium chloride added. Her potassium continues to improve.  Tobacco abuse. Patient was instructed to stop smoking. Nicotine patch was ordered and place.  Depression with anxiety. -Prozac was continued by mouth. Diazepam was changed to IV as needed. Continue current management.   Hypothyroidism. She was continued on Synthroid, but it was changed to IV when she was nothing by mouth. Her TSH was within normal limits. -Synthroid was changed to the oral route.  Anemia and thrombocytopenia. Her counts have decreased, likely from the dilutional effects of IV fluids. Also consider thrombocytopenia from Cipro and Flagyl, less likely Lovenox. Will gently decrease her IV fluids and continue to monitor.     DVT prophylaxis: Lovenox Code Status: Full code Family Communication: Discussed with husband Disposition Plan: Discharged home when clinically appropriate, likely in a few days   Consultants:   Gastroenterology  Procedures:    None  Antimicrobials:   Cipro 10/18/15>>  Flagyl 10/18/15>>   Subjective: Patient says that she feels worse after her diet was advanced. She says that the food was too salty. She had an episode of nausea and vomiting this morning. Her abdominal pain, but she feels that her abdomen is a bit more bloated than yesterday.  Objective: Filed Vitals:   10/20/15 2038 10/20/15 2040 10/21/15 0031 10/21/15 0410  BP:  96/40 103/59 98/56  Pulse:  59 57 75  Temp:  97.5 F (36.4 C) 97.7 F (36.5 C) 97.9 F (36.6 C)  TempSrc:  Oral Oral Oral  Resp:  Height:      Weight:      SpO2:  99% 100% 100% 100%    Intake/Output Summary (Last 24 hours) at 10/21/15 1016 Last data filed at 10/21/15 0800  Gross per 24 hour  Intake    240 ml  Output      4 ml  Net    236 ml   Filed Weights   10/18/15 1154 10/18/15 2034  Weight: 63.504 kg (140 lb) 67.722 kg (149 lb 4.8 oz)    Examination:  General exam: 45 year old woman, In no acute distress. Respiratory system: Clear to auscultation. Respiratory effort normal. Cardiovascular system: S1 & S2 heard, RRR. No JVD, murmurs, rubs, gallops or clicks. No pedal edema. Gastrointestinal system: Abdomen is soft with minimal distention; mild tenderness diffusely. Central nervous system: Alert and oriented. No focal neurological deficits. Extremities: Symmetric 5 x 5 power. Skin: No rashes, lesions or ulcers Psychiatry: Judgement and insight appear normal. Mood & affect appropriate.     Data Reviewed: I have personally reviewed following labs and imaging studies  CBC:  Recent Labs Lab 10/18/15 1230 10/19/15 0555 10/20/15 0634 10/21/15 0650  WBC 17.4* 8.6 6.6 5.6  NEUTROABS 15.3*  --   --   --   HGB 14.0 11.2* 10.7* 10.5*  HCT 40.4 32.6* 31.1* 30.2*  MCV 92.0 93.9 94.8 93.5  PLT 231 157 146* 134*   Basic Metabolic Panel:  Recent Labs Lab 10/18/15 1230 10/19/15 0555 10/20/15 0634 10/21/15 0609  NA 139 134* 136 139  K 3.3* 3.5 4.2 4.4  CL 106 106 108 110  CO2 21* 23 23 21*  GLUCOSE 109* 98 134* 127*  BUN CREATININE 0.88 0.90 0.91 0.73  CALCIUM 9.1 8.0* 8.4* 8.5*   GFR: Estimated Creatinine Clearance: 84.9 mL/min (by C-G formula based on Cr of 0.73). Liver Function Tests:  Recent Labs Lab 10/18/15 1230  AST 27  ALT 33  ALKPHOS 64  BILITOT 0.7  PROT 7.4  ALBUMIN 4.7   No results for input(s): LIPASE, AMYLASE in the last 168 hours. No results for input(s): AMMONIA in the last 168 hours. Coagulation Profile: No results for input(s): INR, PROTIME in the last 168 hours. Cardiac Enzymes: No  results for input(s): CKTOTAL, CKMB, CKMBINDEX, TROPONINI in the last 168 hours. BNP (last 3 results) No results for input(s): PROBNP in the last 8760 hours. HbA1C: No results for input(s): HGBA1C in the last 72 hours. CBG: No results for input(s): GLUCAP in the last 168 hours. Lipid Profile: No results for input(s): CHOL, HDL, LDLCALC, TRIG, CHOLHDL, LDLDIRECT in the last 72 hours. Thyroid Function Tests:  Recent Labs  10/20/15 0634  TSH 0.417   Anemia Panel: No results for input(s): VITAMINB12, FOLATE, FERRITIN, TIBC, IRON, RETICCTPCT in the last 72 hours. Urine analysis:    Component Value Date/Time   COLORURINE YELLOW 10/18/2015 1230   APPEARANCEUR CLEAR 10/18/2015 1230   LABSPEC 1.015 10/18/2015 1230   PHURINE 6.0 10/18/2015 1230   GLUCOSEU NEGATIVE 10/18/2015 1230   HGBUR NEGATIVE 10/18/2015 1230   BILIRUBINUR NEGATIVE 10/18/2015 1230   KETONESUR NEGATIVE 10/18/2015 1230   PROTEINUR NEGATIVE 10/18/2015 1230   UROBILINOGEN 0.2 02/28/2015 2315   NITRITE NEGATIVE 10/18/2015 1230   LEUKOCYTESUR NEGATIVE 10/18/2015 1230   Sepsis Labs: (procalcitonin:4,lacticidven:4)  )  Recent Results (from the past 240 hour(s))  Gastrointestinal Panel by PCR , Stool     Status: None   Collection Time: 10/19/15  7:00 AM  Result Value Ref Range Status   Campylobacter species NOT DETECTED NOT DETECTED Final   Plesimonas shigelloides NOT DETECTED NOT DETECTED Final   Salmonella species NOT DETECTED NOT DETECTED Final   Yersinia enterocolitica NOT DETECTED NOT DETECTED Final   Vibrio species NOT DETECTED NOT DETECTED Final   Vibrio cholerae NOT DETECTED NOT DETECTED Final   Enteroaggregative E coli (EAEC) NOT DETECTED NOT DETECTED Final   Enteropathogenic E coli (EPEC) NOT DETECTED NOT DETECTED Final   Enterotoxigenic E coli (ETEC) NOT DETECTED NOT DETECTED Final   Shiga like toxin producing E coli (STEC) NOT DETECTED NOT DETECTED Final   E. coli O157 NOT DETECTED NOT  DETECTED Final   Shigella/Enteroinvasive E coli (EIEC) NOT DETECTED NOT DETECTED Final   Cryptosporidium NOT DETECTED NOT DETECTED Final   Cyclospora cayetanensis NOT DETECTED NOT DETECTED Final   Entamoeba histolytica NOT DETECTED NOT DETECTED Final   Giardia lamblia NOT DETECTED NOT DETECTED Final   Adenovirus F40/41 NOT DETECTED NOT DETECTED Final   Astrovirus NOT DETECTED NOT DETECTED Final   Norovirus GI/GII NOT DETECTED NOT DETECTED Final   Rotavirus A NOT DETECTED NOT DETECTED Final   Sapovirus (I, II, IV, and V) NOT DETECTED NOT DETECTED Final  C difficile quick scan w PCR reflex     Status: None   Collection Time: 10/19/15  7:00 AM  Result Value Ref Range Status   C Diff antigen NEGATIVE NEGATIVE Final   C Diff toxin NEGATIVE NEGATIVE Final   C Diff interpretation Negative for toxigenic C. difficile  Final         Radiology Studies: No results found.      Scheduled Meds: . ciprofloxacin  400 mg Intravenous Q12H  . enoxaparin (LOVENOX) injection  40 mg Subcutaneous Q24H  . FLUoxetine  40 mg Oral Daily  . levothyroxine  112 mcg Oral QAC breakfast  . metronidazole  500 mg Intravenous Q8H  . nicotine  14 mg Transdermal Daily  . ondansetron (ZOFRAN) IV  4 mg Intravenous Q6H  . pantoprazole (PROTONIX) IV  40 mg Intravenous Q24H   Continuous Infusions: . 0.9 % NaCl with KCl 20 mEq / L 125 mL/hr at 10/21/15 0205     LOS: 3 days    Time spent:30 minutes    Elliot CousinFISHER,Vetra Shinall, MD [facility] Pager [phone]  If 7PM-7AM, please contact night-coverage [email or web address] Password [email or web address] 10/21/2015, 10:16 AM

## 2015-10-21 NOTE — Plan of Care (Signed)
Problem: Bowel/Gastric: Goal: Will not experience complications related to bowel motility Outcome: Not Progressing Pt had 3 bowel movements and 2 episodes of vomiting. PRN and scheduled anti-emetic medications given. Some relief. Pt still not tolerating diet

## 2015-10-21 NOTE — Progress Notes (Signed)
  Subjective:  Patient states she had 5 loose stools yesterday and 3 today. She denies melena or rectal bleeding. She vomited 3 times today. She did not vomit blood or coffee-ground material. She also complains of swallowing difficulty which states started couple of days before onset of her acute symptoms.  Objective: Blood pressure 98/56, pulse 75, temperature 97.9 F (36.6 C), temperature source Oral, resp. rate 20, height 5\' 6"  (1.676 m), weight 149 lb 4.8 oz (67.722 kg), SpO2 100 %. Patient is alert and in no acute distress. Abdomen abdomen is symmetrical. Bowel sounds are hyperactive. On palpation abdomen is soft with mild tenderness in both flanks. No organomegaly or masses. No LE edema or clubbing noted.  Labs/studies Results:   Recent Labs  10/19/15 0555 10/20/15 0634 10/21/15 0650  WBC 8.6 6.6 5.6  HGB 11.2* 10.7* 10.5*  HCT 32.6* 31.1* 30.2*  PLT 157 146* 134*    BMET   Recent Labs  10/19/15 0555 10/20/15 0634 10/21/15 0609  NA 134* 136 139  K 3.5 4.2 4.4  CL 106 108 110  CO2 23 23 21*  GLUCOSE 98 134* 127*  BUN 10 7 6   CREATININE 0.90 0.91 0.73  CALCIUM 8.0* 8.4* 8.5*    LFT   Recent Labs  10/18/15 1230  PROT 7.4  ALBUMIN 4.7  AST 27  ALT 33  ALKPHOS 64  BILITOT 0.7    GI pathogen panel is negative. CT films reviewed with patient and her husband.  Assessment:  #1. Acute syndrome with nausea vomiting and diarrhea secondary to gastroenteritis or enterocolitis. Imaging studies reviewed with Dr. Tyron RussellBoles. There is focal stranding to mesentery around a group of small bowel. Is fluid in the colon but no wall thickening. Stool studies are negative.   #2.Dysphagia of recent onset ossa bleed due to reflux esophagitis. Doubt Candida esophagitis.   #3. Chronic constipation. Once acute symptoms have resolved she may need to be on MiraLAX or other agents on schedule rather than when necessary.  Recommendations:  Continue full liquids. Increase  pantoprazole to 40 mg IV every 12 hours.

## 2015-10-21 NOTE — Plan of Care (Signed)
Problem: Pain Managment: Goal: General experience of comfort will improve Outcome: Progressing PT still complains of severe mid abdominal pain and requesting pain medication

## 2015-10-22 DIAGNOSIS — D696 Thrombocytopenia, unspecified: Secondary | ICD-10-CM

## 2015-10-22 LAB — CBC
HEMATOCRIT: 29.9 % — AB (ref 36.0–46.0)
HEMOGLOBIN: 10.5 g/dL — AB (ref 12.0–15.0)
MCH: 32.3 pg (ref 26.0–34.0)
MCHC: 35.1 g/dL (ref 30.0–36.0)
MCV: 92 fL (ref 78.0–100.0)
Platelets: 166 10*3/uL (ref 150–400)
RBC: 3.25 MIL/uL — ABNORMAL LOW (ref 3.87–5.11)
RDW: 12.6 % (ref 11.5–15.5)
WBC: 5.4 10*3/uL (ref 4.0–10.5)

## 2015-10-22 LAB — BASIC METABOLIC PANEL
ANION GAP: 6 (ref 5–15)
BUN: 6 mg/dL (ref 6–20)
CALCIUM: 8.7 mg/dL — AB (ref 8.9–10.3)
CO2: 25 mmol/L (ref 22–32)
Chloride: 111 mmol/L (ref 101–111)
Creatinine, Ser: 0.78 mg/dL (ref 0.44–1.00)
GFR calc non Af Amer: >60 mL/min (ref >60)
Glucose, Bld: 91 mg/dL (ref 65–99)
Potassium: 4 mmol/L (ref 3.5–5.1)
SODIUM: 142 mmol/L (ref 135–145)

## 2015-10-22 MED ORDER — PROMETHAZINE HCL 12.5 MG PO TABS
25.0000 mg | ORAL_TABLET | Freq: Three times a day (TID) | ORAL | Status: DC | PRN
  Administered 2015-10-22 (×2): 25 mg via ORAL
  Filled 2015-10-22 (×2): qty 2

## 2015-10-22 MED ORDER — PANTOPRAZOLE SODIUM 40 MG PO TBEC
40.0000 mg | DELAYED_RELEASE_TABLET | Freq: Two times a day (BID) | ORAL | Status: DC
  Administered 2015-10-22 – 2015-10-23 (×3): 40 mg via ORAL
  Filled 2015-10-22 (×3): qty 1

## 2015-10-22 MED ORDER — METRONIDAZOLE 500 MG PO TABS
250.0000 mg | ORAL_TABLET | Freq: Three times a day (TID) | ORAL | Status: DC
  Administered 2015-10-22 – 2015-10-23 (×4): 250 mg via ORAL
  Filled 2015-10-22 (×4): qty 1

## 2015-10-22 MED ORDER — CIPROFLOXACIN HCL 250 MG PO TABS
500.0000 mg | ORAL_TABLET | Freq: Two times a day (BID) | ORAL | Status: DC
  Administered 2015-10-22: 500 mg via ORAL
  Filled 2015-10-22 (×2): qty 2

## 2015-10-22 NOTE — Progress Notes (Signed)
  Subjective:  Patient states she vomited her breakfast and 30 minutes of eating. She did not experience hematemesis. She is having mild abdominal pain primarily in the mid and upper abdomen. She had 7 bowel movements yesterday but none today. Her appetite is not back. She is not having any heartburn today. She states ondansetron does not control her nausea.   Objective: Blood pressure 100/65, pulse 64, temperature 98 F (36.7 C), temperature source Oral, resp. rate 18, height 5\' 6"  (1.676 m), weight 149 lb 4.8 oz (67.722 kg), SpO2 96 %. Patient is alert and in no acute distress. Abdomen is symmetrical. Bowel sounds are hyperactive. On palpation abdomen is soft with mild peri-emboli: Midepigastric tenderness. No organomegaly or masses. No LE edema or clubbing noted.  Labs/studies Results:   Recent Labs  10/20/15 0634 10/21/15 0650 10/22/15 0531  WBC 6.6 5.6 5.4  HGB 10.7* 10.5* 10.5*  HCT 31.1* 30.2* 29.9*  PLT 146* 134* 166    BMET   Recent Labs  10/20/15 0634 10/21/15 0609 10/22/15 0531  NA 136 139 142  K 4.2 4.4 4.0  CL 108 110 111  CO2 23 21* 25  GLUCOSE 134* 127* 91  BUN 7 6 6   CREATININE 0.91 0.73 0.78  CALCIUM 8.4* 8.5* 8.7*      Assessment:  #1. 2 gastroenteritis possibly foodborne illness. GI pathogen panel and C. difficile negative. She is on empiric antibiotic therapy and now transitioned to oral route. Diarrhea has improved but she remains with nausea and vomiting. #2. Anemia secondary to acute illness. No evidence of overt GI bleed. #3. GERD. GERD symptoms appear to be due to acute illness. She is better with double dose PPI.  Recommendations:  Promethazine 25 mg by mouth twice a day when necessary. Will reevaluate patient early in a.m. and determine if she should undergo esophagogastroduodenoscopy.

## 2015-10-22 NOTE — Progress Notes (Signed)
PROGRESS NOTE    [name]  ZOX:[id number] DOB: 1971/02/19 DOA: 10/18/2015 PCP: Alyssa Conrad, MD    Outpatient Specialists:Dr Rehman [name])    Brief Narrative:  Patient is a 45 year old woman with a history of diverticulitis, IBS, hypothyroidism, and tobacco use, who presented to the emergency department on 10/18/2015 with a chief complaint of abdominal pain, nausea, vomiting, and diarrhea. In the ED, CT scan of her abdomen and pelvis revealed mild thickening of the walls of the transverse colon, descending colon, and sigmoid colon; small amount of fluid in the pelvis; depressed IVC suggesting hypovolemia; status post cholecystectomy. She was febrile and her white blood cell count was elevated at 17.4. She was admitted for further evaluation and management of colitis.  Gastroenterologist, Dr. Karilyn [name] was consulted and he believes the patient has more of a gastroenteritis or enterocolitis. Her symptoms have subsided. Her GI pathogen panel is C. difficile PCR negative. Advancement of her diet will be deferred to Dr. Karilyn [name]. She will likely need another day or 2 of hospitalization.   Assessment & Plan:   Principal Problem:   Enterocolitis Active Problems:   Tobacco abuse   Hypokalemia   Hypothyroidism, adult   Hypovolemia   Thrombocytopenia (HCC)   1. Enterocolitis with associated vomiting, diarrhea, and abdominal pain. -Patient was started on IV fluids, Cipro, and Flagyl. GI pathogen panel was ordered. Patient was made to be virtually nothing by mouth. Zofran changed to scheduled every 6 hours. IV Dilaudid was ordered and titrated up for pain. C. Difficile PCR was added for quicker results. It was negative. GI pathogen panel was negative. Gastroenterology consulted. Dr. Jena [name] provided the initial assessment and recommendations followed by Dr. Karilyn [name]. -Patient's pain and abdominal distention did subside, but with advancement of her diet, she has a bit more abdominal pain and had an  episode of emesis on 5/1. Her diet was downgraded to full liquids again. IV Protonix was added. She is now afebrile and her white blood cell count has normalized. -GI, Dr. Karilyn [name] reviewed the CT results with radiology and he believes her GI symptoms are more consistent with acute gastroenteritis or enterocolitis. -We'll change Cipro and Flagyl to by mouth and deferred advancing diet to Dr. Karilyn [name].  Reflux esophagitis. IV Protonix started and increased to twice a day per Dr. Karilyn [name].  Hypovolemia. She was started on vigorous IV fluids. Her blood pressure has been on the low-normal side, but she says that her blood pressures do run on the lower than normal side chronically. She was virtually asymptomatic when her systolic blood pressure decreased into the 80s. -We'll continue IV fluids with decreasing the rate as tolerated.  Hypokalemia. Patient serum potassium 3.3 on admission. It has improved following supplementation.  Tobacco abuse. Patient was instructed to stop smoking. Nicotine patch was ordered and place.  Depression with anxiety. -Prozac was continued by mouth. Diazepam was changed to IV as needed-will change back to by mouth. Continue current management.   Hypothyroidism. She was continued on Synthroid, but it was changed to IV when she was nothing by mouth. Her TSH was within normal limits. -Synthroid was changed to the oral route.  Anemia and thrombocytopenia. Her counts have decreased, likely from the dilutional effects of IV fluids. Also consider thrombocytopenia from Cipro and Flagyl, less likely Lovenox. Will gently decrease her IV fluids and continue to monitor. -Her platelet count has normalized. Her hemoglobin is stabilizing at 10.5.     DVT prophylaxis: Lovenox Code Status: Full code Family Communication: Discussed with husband  Disposition Plan: Discharged home when clinically appropriate, likely in a few days   Consultants:   Gastroenterology  Procedures:      None  Antimicrobials:   Cipro 10/18/15>>  Flagyl 10/18/15>>   Subjective: Patient's last bowel movement was loose and was last night. She denies vomiting overnight or this morning. She is tolerating her full liquids better. Her abdominal bloating and pain have subsided.  Objective: Filed Vitals:   10/21/15 1300 10/21/15 1837 10/21/15 2200 10/22/15 0506  BP: 105/60 97/73 84/47  100/58  Pulse: 62 60 61 60  Temp: 98 F (36.7 C) 97.9 F (36.6 C) 97.5 F (36.4 C) 98.2 F (36.8 C)  TempSrc: Oral Oral Oral Oral  Resp: 18 18 20 18   Height:      Weight:      SpO2: 100% 99% 100% 100%    Intake/Output Summary (Last 24 hours) at 10/22/15 1049 Last data filed at 10/22/15 0800  Gross per 24 hour  Intake 3522.92 ml  Output    401 ml  Net 3121.92 ml   Filed Weights   10/18/15 1154 10/18/15 2034  Weight: 63.504 kg (140 lb) 67.722 kg (149 lb 4.8 oz)    Examination:  General exam: 45 year old woman, in no acute distress. Respiratory system: Clear to auscultation. Respiratory effort normal. Cardiovascular system: S1 & S2 heard, RRR. No JVD, murmurs, rubs, gallops or clicks. No pedal edema. Gastrointestinal system: Abdomen is soft with resolved distention and no appreciable tenderness. Central nervous system: Alert and oriented. No focal neurological deficits. Extremities: Symmetric 5 x 5 power. Skin: No rashes, lesions or ulcers Psychiatry: Judgement and insight appear normal. Mood & affect appropriate.     Data Reviewed: I have personally reviewed following labs and imaging studies  CBC:  Recent Labs Lab 10/18/15 1230 10/19/15 0555 10/20/15 0634 10/21/15 0650 10/22/15 0531  WBC 17.4* 8.6 6.6 5.6 5.4  NEUTROABS 15.3*  --   --   --   --   HGB 14.0 11.2* 10.7* 10.5* 10.5*  HCT 40.4 32.6* 31.1* 30.2* 29.9*  MCV 92.0 93.9 94.8 93.5 92.0  PLT 231 157 146* 134* 166   Basic Metabolic Panel:  Recent Labs Lab 10/18/15 1230 10/19/15 0555 10/20/15 0634 10/21/15 0609  10/22/15 0531  NA 139 134* 136 139 142  K 3.3* 3.5 4.2 4.4 4.0  CL 106 106 108 110 111  CO2 21* 23 23 21* 25  GLUCOSE 109* 98 134* 127* 91  BUN 16 10 7 6 6   CREATININE 0.88 0.90 0.91 0.73 0.78  CALCIUM 9.1 8.0* 8.4* 8.5* 8.7*   GFR: Estimated Creatinine Clearance: 84.9 mL/min (by C-G formula based on Cr of 0.78). Liver Function Tests:  Recent Labs Lab 10/18/15 1230  AST 27  ALT 33  ALKPHOS 64  BILITOT 0.7  PROT 7.4  ALBUMIN 4.7   No results for input(s): LIPASE, AMYLASE in the last 168 hours. No results for input(s): AMMONIA in the last 168 hours. Coagulation Profile: No results for input(s): INR, PROTIME in the last 168 hours. Cardiac Enzymes: No results for input(s): CKTOTAL, CKMB, CKMBINDEX, TROPONINI in the last 168 hours. BNP (last 3 results) No results for input(s): PROBNP in the last 8760 hours. HbA1C: No results for input(s): HGBA1C in the last 72 hours. CBG: No results for input(s): GLUCAP in the last 168 hours. Lipid Profile: No results for input(s): CHOL, HDL, LDLCALC, TRIG, CHOLHDL, LDLDIRECT in the last 72 hours. Thyroid Function Tests:  Recent Labs  10/20/15 0634  TSH 0.417  Anemia Panel: No results for input(s): VITAMINB12, FOLATE, FERRITIN, TIBC, IRON, RETICCTPCT in the last 72 hours. Urine analysis:    Component Value Date/Time   COLORURINE YELLOW 10/18/2015 1230   APPEARANCEUR CLEAR 10/18/2015 1230   LABSPEC 1.015 10/18/2015 1230   PHURINE 6.0 10/18/2015 1230   GLUCOSEU NEGATIVE 10/18/2015 1230   HGBUR NEGATIVE 10/18/2015 1230   BILIRUBINUR NEGATIVE 10/18/2015 1230   KETONESUR NEGATIVE 10/18/2015 1230   PROTEINUR NEGATIVE 10/18/2015 1230   UROBILINOGEN 0.2 02/28/2015 2315   NITRITE NEGATIVE 10/18/2015 1230   LEUKOCYTESUR NEGATIVE 10/18/2015 1230   Sepsis Labs: @LABRCNTIP (procalcitonin:4,lacticidven:4)  ) Recent Results (from the past 240 hour(s))  Gastrointestinal Panel by PCR , Stool     Status: None   Collection Time: 10/19/15   7:00 AM  Result Value Ref Range Status   Campylobacter species NOT DETECTED NOT DETECTED Final   Plesimonas shigelloides NOT DETECTED NOT DETECTED Final   Salmonella species NOT DETECTED NOT DETECTED Final   Yersinia enterocolitica NOT DETECTED NOT DETECTED Final   Vibrio species NOT DETECTED NOT DETECTED Final   Vibrio cholerae NOT DETECTED NOT DETECTED Final   Enteroaggregative E coli (EAEC) NOT DETECTED NOT DETECTED Final   Enteropathogenic E coli (EPEC) NOT DETECTED NOT DETECTED Final   Enterotoxigenic E coli (ETEC) NOT DETECTED NOT DETECTED Final   Shiga like toxin producing E coli (STEC) NOT DETECTED NOT DETECTED Final   E. coli O157 NOT DETECTED NOT DETECTED Final   Shigella/Enteroinvasive E coli (EIEC) NOT DETECTED NOT DETECTED Final   Cryptosporidium NOT DETECTED NOT DETECTED Final   Cyclospora cayetanensis NOT DETECTED NOT DETECTED Final   Entamoeba histolytica NOT DETECTED NOT DETECTED Final   Giardia lamblia NOT DETECTED NOT DETECTED Final   Adenovirus F40/41 NOT DETECTED NOT DETECTED Final   Astrovirus NOT DETECTED NOT DETECTED Final   Norovirus GI/GII NOT DETECTED NOT DETECTED Final   Rotavirus A NOT DETECTED NOT DETECTED Final   Sapovirus (I, II, IV, and V) NOT DETECTED NOT DETECTED Final  C difficile quick scan w PCR reflex     Status: None   Collection Time: 10/19/15  7:00 AM  Result Value Ref Range Status   C Diff antigen NEGATIVE NEGATIVE Final   C Diff toxin NEGATIVE NEGATIVE Final   C Diff interpretation Negative for toxigenic C. difficile  Final         Radiology Studies: No results found.      Scheduled Meds: . ciprofloxacin  400 mg Intravenous Q12H  . enoxaparin (LOVENOX) injection  40 mg Subcutaneous Q24H  . FLUoxetine  40 mg Oral Daily  . levothyroxine  112 mcg Oral QAC breakfast  . metronidazole  500 mg Intravenous Q8H  . nicotine  14 mg Transdermal Daily  . ondansetron (ZOFRAN) IV  4 mg Intravenous Q6H  . pantoprazole  40 mg Oral BID    Continuous Infusions: . 0.9 % NaCl with KCl 20 mEq / L 75 mL/hr at 10/22/15 0949     LOS: 4 days    Time spent:30 minutes    Elliot Cousin, MD [facility] Pager [phone]  If 7PM-7AM, please contact night-coverage [email or web address] Password [facility] 10/22/2015, 10:49 AM

## 2015-10-22 NOTE — Care Management Note (Signed)
Case Management Note  Patient Details  Name: [name] MRN: [id number] Date of Birth: 06/16/1971  Subjective/Objective:                  Pt is from home, lives with spouse and is ind with ADL's. Pt has transportation, PCP, and no difficulty affording medications.   Action/Plan: Anticipate DC home tomorrow. No CM needs anticipated.  Expected Discharge Date:    10/23/2015              Expected Discharge Plan:  Home/Self Care  In-House Referral:  NA  Discharge planning Services  CM Consult  Post Acute Care Choice:  NA Choice offered to:  NA  DME Arranged:    DME Agency:     HH Arranged:    HH Agency:     Status of Service:  Completed, signed off  Medicare Important Message Given:    Date Medicare IM Given:    Medicare IM give by:    Date Additional Medicare IM Given:    Additional Medicare Important Message give by:     If discussed at Long Length of Stay Meetings, dates discussed:    Additional Comments:  Malcolm MetroChildress, Clark Cuff Demske, RN 10/22/2015, 11:29 AM

## 2015-10-23 ENCOUNTER — Encounter (HOSPITAL_COMMUNITY): Payer: Self-pay | Admitting: *Deleted

## 2015-10-23 ENCOUNTER — Encounter (HOSPITAL_COMMUNITY)
Admission: EM | Disposition: A | Discharge status: Home / Self Care | Payer: Self-pay | Source: Home / Self Care | Attending: Internal Medicine

## 2015-10-23 DIAGNOSIS — R1310 Dysphagia, unspecified: Secondary | ICD-10-CM

## 2015-10-23 DIAGNOSIS — K2960 Other gastritis without bleeding: Secondary | ICD-10-CM

## 2015-10-23 DIAGNOSIS — K3189 Other diseases of stomach and duodenum: Secondary | ICD-10-CM | POA: Diagnosis not present

## 2015-10-23 DIAGNOSIS — K5900 Constipation, unspecified: Secondary | ICD-10-CM

## 2015-10-23 HISTORY — PX: ESOPHAGOGASTRODUODENOSCOPY: SHX5428

## 2015-10-23 SURGERY — EGD (ESOPHAGOGASTRODUODENOSCOPY)
Anesthesia: Moderate Sedation

## 2015-10-23 MED ORDER — CIPROFLOXACIN HCL 500 MG PO TABS: 500.0000 mg | ORAL_TABLET | Freq: Two times a day (BID) | ORAL | Status: DC

## 2015-10-23 MED ORDER — MEPERIDINE HCL 50 MG/ML IJ SOLN
INTRAMUSCULAR | Status: DC | PRN
  Administered 2015-10-23 (×2): 25 mg via INTRAVENOUS

## 2015-10-23 MED ORDER — BUTAMBEN-TETRACAINE-BENZOCAINE 2-2-14 % EX AERO
INHALATION_SPRAY | CUTANEOUS | Status: DC | PRN
  Administered 2015-10-23: 2 via TOPICAL

## 2015-10-23 MED ORDER — MIDAZOLAM HCL 5 MG/5ML IJ SOLN
INTRAMUSCULAR | Status: AC
  Filled 2015-10-23: qty 10

## 2015-10-23 MED ORDER — PANTOPRAZOLE SODIUM 40 MG PO TBEC: 40.0000 mg | DELAYED_RELEASE_TABLET | Freq: Two times a day (BID) | ORAL | Status: DC

## 2015-10-23 MED ORDER — MIDAZOLAM HCL 5 MG/5ML IJ SOLN
INTRAMUSCULAR | Status: DC | PRN
  Administered 2015-10-23: 2 mg via INTRAVENOUS
  Administered 2015-10-23 (×2): 3 mg via INTRAVENOUS
  Administered 2015-10-23: 2 mg via INTRAVENOUS

## 2015-10-23 MED ORDER — METRONIDAZOLE 250 MG PO TABS: 250.0000 mg | ORAL_TABLET | Freq: Three times a day (TID) | ORAL | Status: DC

## 2015-10-23 MED ORDER — MIDAZOLAM HCL 5 MG/5ML IJ SOLN
INTRAMUSCULAR | Status: AC
  Filled 2015-10-23: qty 5

## 2015-10-23 MED ORDER — SODIUM CHLORIDE 0.9 % IV SOLN
INTRAVENOUS | Status: DC
  Administered 2015-10-23: 11:00:00 via INTRAVENOUS

## 2015-10-23 MED ORDER — SODIUM CHLORIDE 0.9% FLUSH
INTRAVENOUS | Status: AC
  Filled 2015-10-23: qty 10

## 2015-10-23 MED ORDER — MEPERIDINE HCL 50 MG/ML IJ SOLN
INTRAMUSCULAR | Status: DC
Start: 2015-10-23 — End: 2015-10-23
  Filled 2015-10-23: qty 1

## 2015-10-23 MED ORDER — STERILE WATER FOR IRRIGATION IR SOLN
Status: DC | PRN
  Administered 2015-10-23: 2.5 mL

## 2015-10-23 MED ORDER — ONDANSETRON 4 MG PO TBDP: 4.0000 mg | ORAL_TABLET | Freq: Three times a day (TID) | ORAL | Status: DC | PRN

## 2015-10-23 MED ORDER — PROMETHAZINE HCL 25 MG/ML IJ SOLN
INTRAMUSCULAR | Status: AC
  Administered 2015-10-23: 12.5 mg
  Filled 2015-10-23: qty 1

## 2015-10-23 NOTE — Op Note (Signed)
[facility] Patient Name: [name] Procedure Date: 10/23/2015 11:14 AM MRN: [id number] Date of Birth: [birth year] Attending MD: Lionel December , MD CSN: [id number] Age: 45 Admit Type: Inpatient Procedure:                Upper GI endoscopy Indications:              Failure to respond to medical treatment Providers:                Lionel December, MD, Nena Polio, RN, Birder Robson,                            Technician Referring MD:             Elliot Cousin, MD Medicines:                Cetacaine spray, Meperidine 50 mg IV, Midazolam 10                            mg IV, Promethazine 12.5 mg IV Complications:            No immediate complications. Estimated Blood Loss:     Estimated blood loss was minimal. Procedure:                Pre-Anesthesia Assessment:                           - Prior to the procedure, a History and Physical                            was performed, and patient medications and                            allergies were reviewed. The patient's tolerance of                            previous anesthesia was also reviewed. The risks                            and benefits of the procedure and the sedation                            options and risks were discussed with the patient.                            All questions were answered, and informed consent                            was obtained. Prior Anticoagulants: The patient                            last took Lovenox (enoxaparin) 1 day prior to the                            procedure. ASA Grade Assessment: II - A patient  with mild systemic disease. After reviewing the                            risks and benefits, the patient was deemed in                            satisfactory condition to undergo the procedure.                           After obtaining informed consent, the endoscope was                            passed under direct vision. Throughout the                procedure, the patient's blood pressure, pulse, and                            oxygen saturations were monitored continuously. The                            [id number] ([id number]) scope was introduced through the                            mouth, and advanced to the second part of duodenum.                            The upper GI endoscopy was accomplished without                            difficulty. The patient tolerated the procedure                            well. Scope In: 11:30:58 AM Scope Out: 11:37:58 AM Total Procedure Duration: 0 hours 7 minutes 0 seconds  Findings:      The gastroesophageal junction and examined esophagus were normal.      Patchy inflammation characterized by congestion (edema), erosions,       erythema and friability was found in the gastric antrum. Biopsies were       taken with a cold forceps for histology.      The exam of the stomach was otherwise normal.      The duodenal bulb and first portion of the duodenum were normal. Impression:               - Normal gastroesophageal junction and esophagus.                           - Acute gastritis. Biopsied.                           - Normal duodenal bulb and first portion of the                            duodenum. Moderate Sedation:      Moderate (conscious) sedation was administered by the endoscopy nurse  and supervised by the endoscopist. The following parameters were       monitored: oxygen saturation, heart rate, blood pressure, CO2       capnography and response to care. Total physician intraservice time was       20 minutes. Recommendation:           - Return patient to hospital ward for ongoing care.                           - Mechanical soft diet today.                           - Resume Lovenox (enoxaparin) at prior dose                            tomorrow.                           - Await pathology results. Procedure Code(s):        --- Professional ---                            [phone], Esophagogastroduodenoscopy, flexible,                            transoral; with biopsy, single or multiple                           99152, Moderate sedation services provided by the                            same physician or other qualified health care                            professional performing the diagnostic or                            therapeutic service that the sedation supports,                            requiring the presence of an independent trained                            observer to assist in the monitoring of the                            patient's level of consciousness and physiological                            status; initial 15 minutes of intraservice time,                            patient age 3 years or older Diagnosis Code(s):        --- Professional ---  K29.00, Acute gastritis without bleeding CPT copyright 2016 [employer]. All rights reserved. The codes documented in this report are preliminary and upon coder review may  be revised to meet current compliance requirements. Lionel DecemberNajeeb Rehman, MD Lionel DecemberNajeeb Rehman, MD 10/23/2015 11:48:59 AM This report has been signed electronically. Number of Addenda: 0

## 2015-10-23 NOTE — Progress Notes (Signed)
Discharge instructions read to patient and her family.  Both verbalized understanding of all instructions. Discharged to home with family 

## 2015-10-23 NOTE — Discharge Summary (Addendum)
Physician Discharge Summary  [name] WUJ:[id number] DOB: [birth year] DOA: 10/18/2015  PCP: [name], MD  Admit date: 10/18/2015 Discharge date: 10/23/2015  Time spent: 35 minutes  Recommendations for Outpatient Follow-up:  1. Follow up with GI in the next 2 weeks. Repeat CBC on follow up   Discharge Diagnoses:  Principal Problem:   Infectious Enterocolitis Active Problems:   Tobacco abuse   Hypokalemia   Hypothyroidism, adult   Hypovolemia   Thrombocytopenia (HCC)   Discharge Condition: improved  Diet recommendation: low salt  Filed Weights   10/18/15 1154 10/18/15 2034  Weight: 63.504 kg (140 lb) 67.722 kg (149 lb 4.8 oz)    History of present illness:  This patient presents to the hospital with complaints of abdominal pain, nausea, vomiting and diarrhea. CT scan of the abdomen and pelvis indicated mild thickening of the walls of the transverse colon, descending colon and sigmoid colon. She was found have an elevated WBC count of 17.4. She was admitted for further management of colitis.  Hospital Course:  Patient was treated with intravenous ciprofloxacin and Flagyl. GI pathogen panel and C. difficile were ordered which were found to be negative. She was seen in consultation by gastroenterology who performed EGD that indicate gastritis, but no evidence of peptic ulcer disease. Was recommended to continue antibiotics for a total of 7 days. Clinically, she feels improved. She is only having 1 loose stool this morning. She is no longer having any vomiting. Abdominal pain has improved. Patient will follow up with GI for further management.  Dehydration and hypokalemia improved with IV fluids. Hemodynamics are currently stable. Hypokalemia also improved with repletion. This is likely related to her GI illness. Remainder of her medical issues remained stable.  Procedures: EGD:- Normal gastroesophageal junction and esophagus.  - Acute gastritis.  Biopsied.  - Normal duodenal bulb and first portion of the    duodenum.  Consultations:  gastroenterology  Discharge Exam: Filed Vitals:   10/23/15 1203 10/23/15 1300  BP: 104/69 105/65  Pulse: 56 59  Temp: 97.7 F (36.5 C) 97.9 F (36.6 C)  Resp: 16 18    General: NAD Cardiovascular: S1, S2 RRR Respiratory: CTA B  Discharge Instructions   Discharge Instructions    Diet - low sodium heart healthy    Complete by:  As directed      Increase activity slowly    Complete by:  As directed           Current Discharge Medication List    START taking these medications   Details  ciprofloxacin (CIPRO) 500 MG tablet Take 1 tablet (500 mg total) by mouth 2 (two) times daily. Qty: 4 tablet, Refills: 0    metroNIDAZOLE (FLAGYL) 250 MG tablet Take 1 tablet (250 mg total) by mouth every 8 (eight) hours. Qty: 6 tablet, Refills: 0    ondansetron (ZOFRAN ODT) 4 MG disintegrating tablet Take 1 tablet (4 mg total) by mouth every 8 (eight) hours as needed for nausea or vomiting. Qty: 20 tablet, Refills: 0    pantoprazole (PROTONIX) 40 MG tablet Take 1 tablet (40 mg total) by mouth 2 (two) times daily. Qty: 60 tablet, Refills: 1      CONTINUE these medications which have NOT CHANGED   Details  diazepam (VALIUM) 10 MG tablet Refills: 4    FLUoxetine (PROZAC) 40 MG capsule TK 1 C PO QD Refills: 11    levothyroxine (SYNTHROID, LEVOTHROID) 112 MCG tablet Take 112 mcg by mouth daily. Refills: 10  vitamin B-12 (CYANOCOBALAMIN) 100 MCG tablet Take 100 mcg by mouth daily.      STOP taking these medications     ibuprofen (ADVIL,MOTRIN) 200 MG tablet        Allergies  Allergen Reactions  . Morphine And Related Itching and Rash   Follow-up Information    Follow up with Malissa HippoEHMAN,NAJEEB U, MD.   Specialty:  Gastroenterology   Why:  call for appoinment in 2 weeks   Contact information:   [address], [address] [place]  [place] [id number] [phone]        The results of significant diagnostics from this hospitalization (including imaging, microbiology, ancillary and laboratory) are listed below for reference.    Significant Diagnostic Studies: Ct Abdomen Pelvis W Contrast  10/18/2015  CLINICAL DATA:  Abdominal pain, nausea, vomiting and diarrhea since this morning. History of IBS, ulcerative colitis, hysterectomy, appendectomy, and cholecystectomy. EXAM: CT ABDOMEN AND PELVIS WITH CONTRAST TECHNIQUE: Multidetector CT imaging of the abdomen and pelvis was performed using the standard protocol following bolus administration of intravenous contrast. CONTRAST:  100mL ISOVUE-300 IOPAMIDOL (ISOVUE-300) INJECTION 61% COMPARISON:  CT abdomen dated 03/01/2015. FINDINGS: Lower chest:  Minimal dependent atelectasis at each lung base. Hepatobiliary: Status post cholecystectomy. Probable small cyst within the right liver lobe, too small to definitively characterize, stable compared to earlier CT of 03/01/2015. Pancreas: No mass, inflammatory changes, or other significant abnormality. Spleen: Within normal limits in size and appearance. Adrenals/Urinary Tract: No masses identified. No evidence of hydronephrosis. Stomach/Bowel: At least mild thickening of the walls of the transverse colon, descending colon and sigmoid colon. Walls of the right colon appear relatively normal. Fluid is seen throughout the majority of the nondistended colon. No large bowel or small bowel dilatation. Vascular/Lymphatic: Scattered small lymph nodes within the abdomen and pelvis. No enlarged lymph nodes. Mild atherosclerotic changes along the walls of the normal- caliber abdominal aorta. IVC is decompressed suggesting hypovolemia. Reproductive: No mass or other significant abnormality. Other: Trace free fluid in the pelvis. No abscess collection seen. No free intraperitoneal air. No evidence of pneumatosis intestinalis. Musculoskeletal: Mild degenerative change in  the lower lumbar spine, with associated mild disc bulges at the L4-5 and L5-S1 levels. No acute or suspicious osseous lesion. IMPRESSION: 1. At least mild thickening of the walls of the transverse colon, descending colon and sigmoid colon. This is almost certainly related to patient's history of ulcerative colitis, possibly acute on chronic bowel wall thickening. Fluid is present throughout the majority of the colon suggesting at least some degree of acute bowel wall inflammation. No associated bowel obstruction. 2. Small amount of free fluid in the pelvis is likely related to the adjacent bowel wall thickening/inflammation. No abscess collection. No free intraperitoneal air. 3. IVC is decompressed suggesting some degree of hypovolemia. 4. Status post cholecystectomy. 5. Mild degenerative change in the lumbar spine, as described above. Electronically Signed   By: Bary RichardStan  Maynard M.D.   On: 10/18/2015 16:24    Microbiology: Recent Results (from the past 240 hour(s))  Gastrointestinal Panel by PCR , Stool     Status: None   Collection Time: 10/19/15  7:00 AM  Result Value Ref Range Status   Campylobacter species NOT DETECTED NOT DETECTED Final   Plesimonas shigelloides NOT DETECTED NOT DETECTED Final   Salmonella species NOT DETECTED NOT DETECTED Final   Yersinia enterocolitica NOT DETECTED NOT DETECTED Final   Vibrio species NOT DETECTED NOT DETECTED Final   Vibrio cholerae NOT DETECTED NOT DETECTED Final  Enteroaggregative E coli (EAEC) NOT DETECTED NOT DETECTED Final   Enteropathogenic E coli (EPEC) NOT DETECTED NOT DETECTED Final   Enterotoxigenic E coli (ETEC) NOT DETECTED NOT DETECTED Final   Shiga like toxin producing E coli (STEC) NOT DETECTED NOT DETECTED Final   E. coli O157 NOT DETECTED NOT DETECTED Final   Shigella/Enteroinvasive E coli (EIEC) NOT DETECTED NOT DETECTED Final   Cryptosporidium NOT DETECTED NOT DETECTED Final   Cyclospora cayetanensis NOT DETECTED NOT DETECTED Final    Entamoeba histolytica NOT DETECTED NOT DETECTED Final   Giardia lamblia NOT DETECTED NOT DETECTED Final   Adenovirus F40/41 NOT DETECTED NOT DETECTED Final   Astrovirus NOT DETECTED NOT DETECTED Final   Norovirus GI/GII NOT DETECTED NOT DETECTED Final   Rotavirus A NOT DETECTED NOT DETECTED Final   Sapovirus (I, II, IV, and V) NOT DETECTED NOT DETECTED Final  C difficile quick scan w PCR reflex     Status: None   Collection Time: 10/19/15  7:00 AM  Result Value Ref Range Status   C Diff antigen NEGATIVE NEGATIVE Final   C Diff toxin NEGATIVE NEGATIVE Final   C Diff interpretation Negative for toxigenic C. difficile  Final     Labs: Basic Metabolic Panel:  Recent Labs Lab 10/18/15 1230 10/19/15 0555 10/20/15 0634 10/21/15 0609 10/22/15 0531  NA 139 134* 136 139 142  K 3.3* 3.5 4.2 4.4 4.0  CL 106 106 108 110 111  CO2 21* 23 23 21* 25  GLUCOSE 109* 98 134* 127* 91  BUN 16 10 7 6 6   CREATININE 0.88 0.90 0.91 0.73 0.78  CALCIUM 9.1 8.0* 8.4* 8.5* 8.7*   Liver Function Tests:  Recent Labs Lab 10/18/15 1230  AST 27  ALT 33  ALKPHOS 64  BILITOT 0.7  PROT 7.4  ALBUMIN 4.7   No results for input(s): LIPASE, AMYLASE in the last 168 hours. No results for input(s): AMMONIA in the last 168 hours. CBC:  Recent Labs Lab 10/18/15 1230 10/19/15 0555 10/20/15 0634 10/21/15 0650 10/22/15 0531  WBC 17.4* 8.6 6.6 5.6 5.4  NEUTROABS 15.3*  --   --   --   --   HGB 14.0 11.2* 10.7* 10.5* 10.5*  HCT 40.4 32.6* 31.1* 30.2* 29.9*  MCV 92.0 93.9 94.8 93.5 92.0  PLT 231 157 146* 134* 166   Cardiac Enzymes: No results for input(s): CKTOTAL, CKMB, CKMBINDEX, TROPONINI in the last 168 hours. BNP: BNP (last 3 results) No results for input(s): BNP in the last 8760 hours.  ProBNP (last 3 results) No results for input(s): PROBNP in the last 8760 hours.  CBG: No results for input(s): GLUCAP in the last 168 hours.     SignedErick Blinks MD.  [facility] 10/23/2015, 3:42 PM

## 2015-10-23 NOTE — Progress Notes (Signed)
Patient ID: [name], female   DOB: 12/22/1970, 45 y.o.   MRN: [id number] She says she does not feel good today. She is nauseated however when I entered room she was drinking her coffee. She says she is not going to eat her breakfast. She says the breakfast does not look good. No vomiting. She had a BM about 45 minutes and was loose. She continues to have epigastric pain. Rates pain at a 4/10. She says the Phenergan is helping. GI pathogen negative. C-diff negative. She denies any acid reflux.  Covered with Protonix.  Blood pressure 100/71, pulse 70, temperature 98.2 F (36.8 C), temperature source Oral, resp. rate 20, height 5\' 6"  (1.676 m), weight 149 lb 4.8 oz (67.722 kg), SpO2 100 %.  EGD in 2009 by . Dr. Darrick PennaFields;  DIAGNOSES: 1. Mild gastritis. 2. Mild duodenitis. 3. No obvious source for [name]'s nausea, vomiting and abdominal  pain identified on endoscopic exam.   Assessment:  GERD/nausea: PUD needs to be ruled out. Patient would like to proceed with EGD. Patient advised she is NPO.

## 2015-10-28 ENCOUNTER — Encounter (HOSPITAL_COMMUNITY): Payer: Self-pay | Admitting: Internal Medicine

## 2015-10-29 ENCOUNTER — Telehealth (INDEPENDENT_AMBULATORY_CARE_PROVIDER_SITE_OTHER): Payer: Self-pay | Admitting: Internal Medicine

## 2015-10-29 DIAGNOSIS — D649 Anemia, unspecified: Secondary | ICD-10-CM | POA: Diagnosis not present

## 2015-10-29 DIAGNOSIS — N63 Unspecified lump in breast: Secondary | ICD-10-CM | POA: Diagnosis not present

## 2015-10-29 DIAGNOSIS — F17200 Nicotine dependence, unspecified, uncomplicated: Secondary | ICD-10-CM | POA: Diagnosis not present

## 2015-10-29 MED ORDER — DICYCLOMINE HCL 10 MG PO CAPS: 10.0000 mg | ORAL_CAPSULE | Freq: Three times a day (TID) | ORAL | Status: DC | PRN

## 2015-10-30 DIAGNOSIS — N63 Unspecified lump in breast: Secondary | ICD-10-CM | POA: Diagnosis not present

## 2015-10-30 DIAGNOSIS — N644 Mastodynia: Secondary | ICD-10-CM | POA: Diagnosis not present

## 2015-11-01 NOTE — Telephone Encounter (Signed)
Results reviewed with patient

## 2015-11-06 ENCOUNTER — Observation Stay (HOSPITAL_COMMUNITY)
Admission: EM | Admit: 2015-11-06 | Discharge: 2015-11-10 | DRG: 392 | Disposition: A | Discharge status: Home / Self Care | Payer: BLUE CROSS/BLUE SHIELD | Attending: Internal Medicine | Admitting: Internal Medicine

## 2015-11-06 ENCOUNTER — Emergency Department (HOSPITAL_COMMUNITY): Payer: BLUE CROSS/BLUE SHIELD

## 2015-11-06 ENCOUNTER — Encounter (HOSPITAL_COMMUNITY): Payer: Self-pay

## 2015-11-06 DIAGNOSIS — E876 Hypokalemia: Secondary | ICD-10-CM | POA: Diagnosis not present

## 2015-11-06 DIAGNOSIS — Z833 Family history of diabetes mellitus: Secondary | ICD-10-CM | POA: Diagnosis not present

## 2015-11-06 DIAGNOSIS — F419 Anxiety disorder, unspecified: Secondary | ICD-10-CM | POA: Diagnosis not present

## 2015-11-06 DIAGNOSIS — K529 Noninfective gastroenteritis and colitis, unspecified: Principal | ICD-10-CM | POA: Diagnosis present

## 2015-11-06 DIAGNOSIS — R1110 Vomiting, unspecified: Secondary | ICD-10-CM | POA: Diagnosis present

## 2015-11-06 DIAGNOSIS — Z888 Allergy status to other drugs, medicaments and biological substances status: Secondary | ICD-10-CM | POA: Diagnosis not present

## 2015-11-06 DIAGNOSIS — Z825 Family history of asthma and other chronic lower respiratory diseases: Secondary | ICD-10-CM | POA: Insufficient documentation

## 2015-11-06 DIAGNOSIS — F17210 Nicotine dependence, cigarettes, uncomplicated: Secondary | ICD-10-CM | POA: Insufficient documentation

## 2015-11-06 DIAGNOSIS — Z90710 Acquired absence of both cervix and uterus: Secondary | ICD-10-CM | POA: Diagnosis not present

## 2015-11-06 DIAGNOSIS — D649 Anemia, unspecified: Secondary | ICD-10-CM | POA: Diagnosis not present

## 2015-11-06 DIAGNOSIS — K635 Polyp of colon: Secondary | ICD-10-CM | POA: Diagnosis not present

## 2015-11-06 DIAGNOSIS — K219 Gastro-esophageal reflux disease without esophagitis: Secondary | ICD-10-CM | POA: Diagnosis present

## 2015-11-06 DIAGNOSIS — E872 Acidosis: Secondary | ICD-10-CM | POA: Insufficient documentation

## 2015-11-06 DIAGNOSIS — R197 Diarrhea, unspecified: Secondary | ICD-10-CM | POA: Diagnosis not present

## 2015-11-06 DIAGNOSIS — K210 Gastro-esophageal reflux disease with esophagitis: Secondary | ICD-10-CM | POA: Diagnosis not present

## 2015-11-06 DIAGNOSIS — E039 Hypothyroidism, unspecified: Secondary | ICD-10-CM | POA: Diagnosis present

## 2015-11-06 DIAGNOSIS — D125 Benign neoplasm of sigmoid colon: Secondary | ICD-10-CM | POA: Diagnosis not present

## 2015-11-06 DIAGNOSIS — R112 Nausea with vomiting, unspecified: Secondary | ICD-10-CM | POA: Diagnosis not present

## 2015-11-06 DIAGNOSIS — Z79899 Other long term (current) drug therapy: Secondary | ICD-10-CM | POA: Diagnosis not present

## 2015-11-06 DIAGNOSIS — K2900 Acute gastritis without bleeding: Secondary | ICD-10-CM | POA: Diagnosis not present

## 2015-11-06 DIAGNOSIS — Z9889 Other specified postprocedural states: Secondary | ICD-10-CM | POA: Diagnosis not present

## 2015-11-06 DIAGNOSIS — Z9049 Acquired absence of other specified parts of digestive tract: Secondary | ICD-10-CM | POA: Diagnosis not present

## 2015-11-06 DIAGNOSIS — Z801 Family history of malignant neoplasm of trachea, bronchus and lung: Secondary | ICD-10-CM | POA: Insufficient documentation

## 2015-11-06 DIAGNOSIS — I10 Essential (primary) hypertension: Secondary | ICD-10-CM | POA: Diagnosis not present

## 2015-11-06 DIAGNOSIS — Z803 Family history of malignant neoplasm of breast: Secondary | ICD-10-CM | POA: Diagnosis not present

## 2015-11-06 DIAGNOSIS — Z8249 Family history of ischemic heart disease and other diseases of the circulatory system: Secondary | ICD-10-CM | POA: Diagnosis not present

## 2015-11-06 DIAGNOSIS — R109 Unspecified abdominal pain: Secondary | ICD-10-CM | POA: Diagnosis not present

## 2015-11-06 LAB — COMPREHENSIVE METABOLIC PANEL
ALK PHOS: 69 U/L (ref 38–126)
ALT: 40 U/L (ref 14–54)
ANION GAP: 9 (ref 5–15)
AST: 27 U/L (ref 15–41)
Albumin: 4.4 g/dL (ref 3.5–5.0)
BUN: 20 mg/dL (ref 6–20)
CALCIUM: 9.2 mg/dL (ref 8.9–10.3)
CO2: 19 mmol/L — ABNORMAL LOW (ref 22–32)
Chloride: 111 mmol/L (ref 101–111)
Creatinine, Ser: 0.9 mg/dL (ref 0.44–1.00)
Glucose, Bld: 117 mg/dL — ABNORMAL HIGH (ref 65–99)
Potassium: 3.2 mmol/L — ABNORMAL LOW (ref 3.5–5.1)
Sodium: 139 mmol/L (ref 135–145)
TOTAL PROTEIN: 7.2 g/dL (ref 6.5–8.1)
Total Bilirubin: 0.6 mg/dL (ref 0.3–1.2)

## 2015-11-06 LAB — URINALYSIS, ROUTINE W REFLEX MICROSCOPIC
BILIRUBIN URINE: NEGATIVE
Glucose, UA: NEGATIVE mg/dL
Hgb urine dipstick: NEGATIVE
KETONES UR: NEGATIVE mg/dL
Leukocytes, UA: NEGATIVE
NITRITE: NEGATIVE
Protein, ur: NEGATIVE mg/dL
Specific Gravity, Urine: 1.02 (ref 1.005–1.030)
pH: 5.5 (ref 5.0–8.0)

## 2015-11-06 LAB — CBC
HCT: 33.6 % — ABNORMAL LOW (ref 36.0–46.0)
HEMOGLOBIN: 11.5 g/dL — AB (ref 12.0–15.0)
MCH: 32.3 pg (ref 26.0–34.0)
MCHC: 34.2 g/dL (ref 30.0–36.0)
MCV: 94.4 fL (ref 78.0–100.0)
PLATELETS: 239 10*3/uL (ref 150–400)
RBC: 3.56 MIL/uL — AB (ref 3.87–5.11)
RDW: 13.1 % (ref 11.5–15.5)
WBC: 7.2 10*3/uL (ref 4.0–10.5)

## 2015-11-06 LAB — CBC WITH DIFFERENTIAL/PLATELET
Basophils Absolute: 0 10*3/uL (ref 0.0–0.1)
Basophils Relative: 0 %
EOS PCT: 1 %
Eosinophils Absolute: 0.2 10*3/uL (ref 0.0–0.7)
HCT: 37.3 % (ref 36.0–46.0)
Hemoglobin: 12.8 g/dL (ref 12.0–15.0)
LYMPHS ABS: 1.8 10*3/uL (ref 0.7–4.0)
LYMPHS PCT: 11 %
MCH: 31.7 pg (ref 26.0–34.0)
MCHC: 34.3 g/dL (ref 30.0–36.0)
MCV: 92.3 fL (ref 78.0–100.0)
MONO ABS: 0.8 10*3/uL (ref 0.1–1.0)
Monocytes Relative: 5 %
Neutro Abs: 12.6 10*3/uL — ABNORMAL HIGH (ref 1.7–7.7)
Neutrophils Relative %: 82 %
Platelets: 276 10*3/uL (ref 150–400)
RBC: 4.04 MIL/uL (ref 3.87–5.11)
RDW: 13 % (ref 11.5–15.5)
WBC: 15.4 10*3/uL — ABNORMAL HIGH (ref 4.0–10.5)

## 2015-11-06 LAB — I-STAT BETA HCG BLOOD, ED (MC, WL, AP ONLY)

## 2015-11-06 LAB — CREATININE, SERUM
CREATININE: 0.84 mg/dL (ref 0.44–1.00)
GFR calc Af Amer: >60 mL/min (ref >60)

## 2015-11-06 LAB — LIPASE, BLOOD: Lipase: 54 U/L — ABNORMAL HIGH (ref 11–51)

## 2015-11-06 MED ORDER — POTASSIUM CHLORIDE IN NACL 40-0.9 MEQ/L-% IV SOLN
INTRAVENOUS | Status: DC
  Administered 2015-11-06 – 2015-11-08 (×2): 100 mL/h via INTRAVENOUS

## 2015-11-06 MED ORDER — PANTOPRAZOLE SODIUM 40 MG PO TBEC
40.0000 mg | DELAYED_RELEASE_TABLET | Freq: Two times a day (BID) | ORAL | Status: DC
  Administered 2015-11-06 – 2015-11-09 (×5): 40 mg via ORAL
  Filled 2015-11-06 (×5): qty 1

## 2015-11-06 MED ORDER — ONDANSETRON HCL 4 MG/2ML IJ SOLN
4.0000 mg | INTRAMUSCULAR | Status: DC | PRN
  Administered 2015-11-07 – 2015-11-08 (×7): 4 mg via INTRAVENOUS
  Filled 2015-11-06 (×7): qty 2

## 2015-11-06 MED ORDER — HYDROMORPHONE HCL 1 MG/ML IJ SOLN
1.0000 mg | INTRAMUSCULAR | Status: DC | PRN
  Administered 2015-11-06 – 2015-11-08 (×10): 1 mg via INTRAVENOUS
  Filled 2015-11-06 (×11): qty 1

## 2015-11-06 MED ORDER — SODIUM CHLORIDE 0.9 % IV BOLUS (SEPSIS)
1000.0000 mL | Freq: Once | INTRAVENOUS | Status: AC
  Administered 2015-11-06: 1000 mL via INTRAVENOUS

## 2015-11-06 MED ORDER — FLEET ENEMA 7-19 GM/118ML RE ENEM
2.0000 | ENEMA | Freq: Once | RECTAL | Status: AC
  Administered 2015-11-07: 2 via RECTAL

## 2015-11-06 MED ORDER — ENOXAPARIN SODIUM 40 MG/0.4ML ~~LOC~~ SOLN
40.0000 mg | SUBCUTANEOUS | Status: DC
  Administered 2015-11-06 – 2015-11-09 (×4): 40 mg via SUBCUTANEOUS
  Filled 2015-11-06 (×4): qty 0.4

## 2015-11-06 MED ORDER — ACETAMINOPHEN 650 MG RE SUPP: 650.0000 mg | Freq: Four times a day (QID) | RECTAL | Status: DC | PRN

## 2015-11-06 MED ORDER — CIPROFLOXACIN IN D5W 400 MG/200ML IV SOLN
400.0000 mg | Freq: Two times a day (BID) | INTRAVENOUS | Status: DC
  Administered 2015-11-06 – 2015-11-09 (×6): 400 mg via INTRAVENOUS
  Filled 2015-11-06 (×6): qty 200

## 2015-11-06 MED ORDER — HYDROMORPHONE HCL 1 MG/ML IJ SOLN
1.0000 mg | INTRAMUSCULAR | Status: DC | PRN
  Administered 2015-11-06: 1 mg via INTRAVENOUS
  Filled 2015-11-06: qty 1

## 2015-11-06 MED ORDER — ACETAMINOPHEN 325 MG PO TABS
650.0000 mg | ORAL_TABLET | Freq: Four times a day (QID) | ORAL | Status: DC | PRN
  Administered 2015-11-09: 650 mg via ORAL
  Filled 2015-11-06: qty 2

## 2015-11-06 MED ORDER — ONDANSETRON HCL 4 MG/2ML IJ SOLN
4.0000 mg | Freq: Once | INTRAMUSCULAR | Status: AC
  Administered 2015-11-06: 4 mg via INTRAVENOUS
  Filled 2015-11-06: qty 2

## 2015-11-06 MED ORDER — HYDROMORPHONE HCL 1 MG/ML IJ SOLN
0.5000 mg | Freq: Once | INTRAMUSCULAR | Status: AC
Start: 2015-11-06 — End: 2015-11-06
  Administered 2015-11-06: 0.5 mg via INTRAVENOUS
  Filled 2015-11-06: qty 1

## 2015-11-06 MED ORDER — ONDANSETRON HCL 4 MG/2ML IJ SOLN
4.0000 mg | Freq: Three times a day (TID) | INTRAMUSCULAR | Status: DC | PRN
  Filled 2015-11-06: qty 2

## 2015-11-06 MED ORDER — IOPAMIDOL (ISOVUE-300) INJECTION 61%
100.0000 mL | Freq: Once | INTRAVENOUS | Status: AC | PRN
  Administered 2015-11-06: 100 mL via INTRAVENOUS

## 2015-11-06 MED ORDER — HYDROMORPHONE HCL 1 MG/ML IJ SOLN
1.0000 mg | Freq: Once | INTRAMUSCULAR | Status: AC
  Administered 2015-11-06: 1 mg via INTRAVENOUS
  Filled 2015-11-06: qty 1

## 2015-11-06 MED ORDER — DIAZEPAM 5 MG PO TABS
10.0000 mg | ORAL_TABLET | Freq: Four times a day (QID) | ORAL | Status: DC | PRN
  Administered 2015-11-06 – 2015-11-09 (×4): 10 mg via ORAL
  Filled 2015-11-06 (×5): qty 2

## 2015-11-06 MED ORDER — FLUOXETINE HCL 20 MG PO CAPS
40.0000 mg | ORAL_CAPSULE | Freq: Every day | ORAL | Status: DC
  Administered 2015-11-07 – 2015-11-10 (×4): 40 mg via ORAL
  Filled 2015-11-06 (×4): qty 2

## 2015-11-06 MED ORDER — VITAMIN B-12 100 MCG PO TABS
100.0000 ug | ORAL_TABLET | Freq: Every day | ORAL | Status: DC
  Administered 2015-11-08 – 2015-11-09 (×2): 100 ug via ORAL
  Filled 2015-11-06 (×5): qty 1

## 2015-11-06 MED ORDER — METRONIDAZOLE IN NACL 5-0.79 MG/ML-% IV SOLN
500.0000 mg | Freq: Three times a day (TID) | INTRAVENOUS | Status: DC
  Administered 2015-11-06 – 2015-11-09 (×9): 500 mg via INTRAVENOUS
  Filled 2015-11-06 (×8): qty 100

## 2015-11-06 MED ORDER — HYDROMORPHONE HCL 1 MG/ML IJ SOLN
0.5000 mg | Freq: Once | INTRAMUSCULAR | Status: AC
  Administered 2015-11-06: 0.5 mg via INTRAVENOUS
  Filled 2015-11-06: qty 1

## 2015-11-06 MED ORDER — LEVOTHYROXINE SODIUM 112 MCG PO TABS
112.0000 ug | ORAL_TABLET | Freq: Every day | ORAL | Status: DC
  Administered 2015-11-07 – 2015-11-10 (×4): 112 ug via ORAL
  Filled 2015-11-06 (×4): qty 1

## 2015-11-06 NOTE — H&P (Signed)
History and Physical    [name] [id number] DOB: 07/22/1970 DOA: 11/06/2015  PCP: Ernestine ConradBLUTH, KIRK, MD  Patient coming from: home  Chief Complaint: vomiting, diarrhea  HPI: [name] is a 45 y.o. female with medical history significant of hypertension, hypothyroidism, was in her usual state of health yesterday when she had gone to bed. She denied any vomiting, abdominal pain or diarrhea at that time. She reports waking up around 3:30 AM with fecal incontinence. She reports having persistent loose stools since then. She also had associated vomiting. Within a few hours, she reports abdominal cramping started. She's been afebrile. She is feeling very weak and lightheaded. She came to the ER for evaluation.  ED Course: In the ED, she is currently hemodynamically stable. She was mildly hypokalemic and had a mild metabolic acidosis. CT scan indicated possible colitis. She's required frequent doses of IV pain medicine to control her symptoms. She's been referred for admission.  Review of Systems: as per HPI, otherwise negative  Past Medical History  Diagnosis Date  . Anxiety   . IBS (irritable bowel syndrome)   . Thyroid disease   . Diverticula of intestine   . Ulcerative colitis   . Migraines   . Vertigo     Past Surgical History  Procedure Laterality Date  . Abdominal hysterectomy    . Appendectomy    . Cholecystectomy    . Knee arthroscopy    . Esophagogastroduodenoscopy N/A 10/23/2015    Procedure: ESOPHAGOGASTRODUODENOSCOPY (EGD);  Surgeon: Malissa HippoNajeeb U Rehman, MD;  Location: [facility];  Service: Endoscopy;  Laterality: N/A;     reports that she has been smoking Cigarettes.  She has a 20 pack-year smoking history. She has never used smokeless tobacco. She reports that she does not drink alcohol or use illicit drugs.  Allergies  Allergen Reactions  . Morphine And Related Itching and Rash    Family History  Problem Relation Age of Onset  . Diabetes Mother   . COPD  Mother   . Heart disease Mother   . Breast cancer Paternal Grandmother   . Lung cancer Paternal Grandfather      Prior to Admission medications   Medication Sig Start Date End Date Taking? Authorizing Provider  diazepam (VALIUM) 10 MG tablet Take 10 mg by mouth every 6 (six) hours as needed for anxiety.  10/07/15  Yes Historical Provider, MD  FLUoxetine (PROZAC) 40 MG capsule TK 1 C PO QD 10/02/15  Yes Historical Provider, MD  levothyroxine (SYNTHROID, LEVOTHROID) 112 MCG tablet Take 112 mcg by mouth daily. 04/17/15  Yes Historical Provider, MD  pantoprazole (PROTONIX) 40 MG tablet Take 1 tablet (40 mg total) by mouth 2 (two) times daily. 10/23/15  Yes Erick BlinksJehanzeb Memon, MD  vitamin B-12 (CYANOCOBALAMIN) 100 MCG tablet Take 100 mcg by mouth daily.   Yes Historical Provider, MD    Physical Exam: Filed Vitals:   11/06/15 1400 11/06/15 1430 11/06/15 1500 11/06/15 1600  BP: 101/70 104/78 116/87 120/84  Pulse: 73 69 67 72  Temp:    98.4 F (36.9 C)  TempSrc:      Resp:      Height:    5\' 6"  (1.676 m)  Weight:    66.543 kg (146 lb 11.2 oz)  SpO2: 99% 99% 97% 100%      Constitutional: NAD, calm, comfortable Filed Vitals:   11/06/15 1400 11/06/15 1430 11/06/15 1500 11/06/15 1600  BP: 101/70 104/78 116/87 120/84  Pulse: 73 69 67 72  Temp:  98.4 F (36.9 C)  TempSrc:      Resp:      Height:    5\' 6"  (1.676 m)  Weight:    66.543 kg (146 lb 11.2 oz)  SpO2: 99% 99% 97% 100%   Eyes: PERRL, lids and conjunctivae normal ENMT: Mucous membranes are moist. Posterior pharynx clear of any exudate or lesions.Normal dentition.  Neck: normal, supple, no masses, no thyromegaly Respiratory: clear to auscultation bilaterally, no wheezing, no crackles. Normal respiratory effort. No accessory muscle use.  Cardiovascular: Regular rate and rhythm, no murmurs / rubs / gallops. No extremity edema. 2+ pedal pulses. No carotid bruits.  Abdomen: mild tenderness on left side, no masses palpated. No  hepatosplenomegaly. Bowel sounds positive.  Musculoskeletal: no clubbing / cyanosis. No joint deformity upper and lower extremities. Good ROM, no contractures. Normal muscle tone.  Skin: no rashes, lesions, ulcers. No induration Neurologic: CN 2-12 grossly intact. Sensation intact, DTR normal. Strength 5/5 in all 4.  Psychiatric: Normal judgment and insight. Alert and oriented x 3. Normal mood.   Labs on Admission: I have personally reviewed following labs and imaging studies  CBC:  Recent Labs Lab 11/06/15 0900  WBC 15.4*  NEUTROABS 12.6*  HGB 12.8  HCT 37.3  MCV 92.3  PLT 276   Basic Metabolic Panel:  Recent Labs Lab 11/06/15 0900  NA 139  K 3.2*  CL 111  CO2 19*  GLUCOSE 117*  BUN 20  CREATININE 0.90  CALCIUM 9.2   GFR: Estimated Creatinine Clearance: 75.5 mL/min (by C-G formula based on Cr of 0.9). Liver Function Tests:  Recent Labs Lab 11/06/15 0900  AST 27  ALT 40  ALKPHOS 69  BILITOT 0.6  PROT 7.2  ALBUMIN 4.4    Recent Labs Lab 11/06/15 0900  LIPASE 54*   No results for input(s): AMMONIA in the last 168 hours. Coagulation Profile: No results for input(s): INR, PROTIME in the last 168 hours. Cardiac Enzymes: No results for input(s): CKTOTAL, CKMB, CKMBINDEX, TROPONINI in the last 168 hours. BNP (last 3 results) No results for input(s): PROBNP in the last 8760 hours. HbA1C: No results for input(s): HGBA1C in the last 72 hours. CBG: No results for input(s): GLUCAP in the last 168 hours. Lipid Profile: No results for input(s): CHOL, HDL, LDLCALC, TRIG, CHOLHDL, LDLDIRECT in the last 72 hours. Thyroid Function Tests: No results for input(s): TSH, T4TOTAL, FREET4, T3FREE, THYROIDAB in the last 72 hours. Anemia Panel: No results for input(s): VITAMINB12, FOLATE, FERRITIN, TIBC, IRON, RETICCTPCT in the last 72 hours. Urine analysis:    Component Value Date/Time   COLORURINE YELLOW 11/06/2015 1108   APPEARANCEUR CLEAR 11/06/2015 1108    LABSPEC 1.020 11/06/2015 1108   PHURINE 5.5 11/06/2015 1108   GLUCOSEU NEGATIVE 11/06/2015 1108   HGBUR NEGATIVE 11/06/2015 1108   BILIRUBINUR NEGATIVE 11/06/2015 1108   KETONESUR NEGATIVE 11/06/2015 1108   PROTEINUR NEGATIVE 11/06/2015 1108   UROBILINOGEN 0.2 02/28/2015 2315   NITRITE NEGATIVE 11/06/2015 1108   LEUKOCYTESUR NEGATIVE 11/06/2015 1108   Sepsis Labs: !!!!!!!!!!!!!!!!!!!!!!!!!!!!!!!!!!!!!!!!!!!! @LABRCNTIP (procalcitonin:4,lacticidven:4) )No results found for this or any previous visit (from the past 240 hour(s)).   Radiological Exams on Admission: Ct Abdomen Pelvis W Contrast  11/06/2015  CLINICAL DATA:  Left-sided abdominal pain EXAM: CT ABDOMEN AND PELVIS WITH CONTRAST TECHNIQUE: Multidetector CT imaging of the abdomen and pelvis was performed using the standard protocol following bolus administration of intravenous contrast. CONTRAST:  ISOVUE-300 IOPAMIDOL (ISOVUE-300) INJECTION 61% COMPARISON:  10/18/2015 FINDINGS: Lower chest:  Lung bases are clear.  Normal heart size. Hepatobiliary: Normal liver.  Prior cholecystectomy. Pancreas: Normal. Spleen: Normal. Adrenals/Urinary Tract: Normal adrenal glands. Normal kidneys. No urolithiasis or obstructive uropathy. Normal bladder. Stomach/Bowel: No bowel dilatation to suggest obstruction. Relative bowel wall thickening involving the descending and sigmoid colon concerning for mild colitis. Multiple air-fluid levels in the transverse colon. No pneumatosis, pneumoperitoneum or portal venous gas. Vascular/Lymphatic: Normal caliber abdominal aorta. No lymphadenopathy. Reproductive: Prior hysterectomy.  No adnexal mass. Other: No fluid collection or hematoma. Musculoskeletal: No acute osseous abnormality. No lytic or sclerotic osseous lesion. Small central disc protrusion at L5-S1. Bilateral facet arthropathy at L5-S1. IMPRESSION: 1. Relative bowel wall thickening involving the descending and sigmoid colon concerning for mild colitis which  may be secondary to an infectious or inflammatory etiology. Electronically Signed   By: Elige Ko   On: 11/06/2015 13:32      Assessment/Plan Active Problems:   Anxiety disorder   Hypokalemia   Hypothyroidism, adult   Colitis   Vomiting and diarrhea   GERD (gastroesophageal reflux disease)  1. Colitis, likely infectious. Start the patient on ciprofloxacin and Flagyl. GI pathogen panel has been ordered. We'll continue contact precautions. Gastroenterology has been consulted and will likely perform sigmoidoscopy in a.m . will keep patient on clear liquids for now  2. Nausea, vomiting, diarrhea. Treat supportively with antiemetics. Concerta on clear liquids and advance diet as tolerated.  3. GERD. Continue PPI.  4. Hypothyroidism. Continue Synthroid  5. Hypokalemia. Likely related to GI losses. Will add potassium to IV fluids and check magnesium.  6. Anxiety disorder. Continue home dose of Valium.  DVT prophylaxis: lovenox Code Status: full code Family Communication: discussed with patient Disposition Plan: discharge home once abdominal pain has improved Consults called: GI, Dr. [name] Admission status: inpatient/ med/surg   MEMON,JEHANZEB MD [facility] Pager [phone]  If 7PM-7AM, please contact night-coverage [email or web address] Password [email or web address]  11/06/2015, 7:10 PM

## 2015-11-06 NOTE — Consult Note (Signed)
Referring Provider: Erick Blinks, MD Primary Care Physician:  [name], MD Primary Gastroenterologist:  Dr. [name]  Reason for Consultation:    Recurrent nausea vomiting and diarrhea.  HPI:   Patient is 45 year old Caucasian female who was easily hospitalized between 10/18/2015 and 10/23/2015. She presented with nausea vomiting epigastric pain and nonbloody diarrhea. She was felt to have gastroenteritis but stool studies were negative. She responded to empiric antibiotic therapy. She also underwent esophagogastroduodenoscopy which revealed gastritis but no evidence of H. pylori or eosinophilic gastritis. Patient was begun on dicyclomine and I called her with results of biopsy. On her last admission she was also noted to have anemia felt to be secondary to acute illness. Patient states she started to feel better. She had good yesterday. She has been passing thin caliber formed stool daily. She has history of constipation. She woke up around 3:30 AM this morning with severe cramping and had an accident even before she made to the bathroom. She had 12 or 13 more loose watery stools along with multiple episodes of nausea and vomiting. She did not experience hematemesis melena or rectal bleeding. She felt cold and clammy but did not have fever. She felt weak. She began to have severe cramping across lower abdomen as well as in left lower quadrant which doubled her over. She states that her last admission pain was in epigastric region but this time it was an different location. Patient came to emergency room. She was evaluated and noted to have leukocytosis. Abdominopelvic CT was repeated and revealed mild thickening to descending and sigmoid colon suggestive of colitis. She was hospitalized and begun on IV fluids as well as ondansetron and an analgesic. Patient has not taken laxatives since her last admission. She did finish course of Cipro and metronidazole about 10 days ago. She denies skin rash  joint pain or shortness of breath. She smokes cigarettes. She is trying to quit cigarette smoking and now down to half a pack per day. She does not drink alcohol.    Past Medical History  Diagnosis Date  . Anxiety   . IBS (irritable bowel syndrome)   .  hypothyroidism    .    .    . Migraines   . Vertigo     Past Surgical History  Procedure Laterality Date  . Abdominal hysterectomy    . Appendectomy    . Cholecystectomy    . Knee arthroscopy    . Esophagogastroduodenoscopy N/A 10/23/2015    Procedure: ESOPHAGOGASTRODUODENOSCOPY (EGD);  Surgeon: Malissa Hippo, MD;  Location: [facility];  Service: Endoscopy;  Laterality: N/A;    Prior to Admission medications   Medication Sig Start Date End Date Taking? Authorizing Provider  diazepam (VALIUM) 10 MG tablet Take 10 mg by mouth every 6 (six) hours as needed for anxiety.  10/07/15  Yes Historical Provider, MD  FLUoxetine (PROZAC) 40 MG capsule TK 1 C PO QD 10/02/15  Yes Historical Provider, MD  levothyroxine (SYNTHROID, LEVOTHROID) 112 MCG tablet Take 112 mcg by mouth daily. 04/17/15  Yes Historical Provider, MD  pantoprazole (PROTONIX) 40 MG tablet Take 1 tablet (40 mg total) by mouth 2 (two) times daily. 10/23/15  Yes Erick Blinks, MD  vitamin B-12 (CYANOCOBALAMIN) 100 MCG tablet Take 100 mcg by mouth daily.   Yes Historical Provider, MD    Current Facility-Administered Medications  Medication Dose Route Frequency Provider Last Rate Last Dose  . HYDROmorphone (DILAUDID) injection 1 mg  1 mg Intravenous Q4H PRN Annabelle Harman  Gearldine Bienenstock, MD   1 mg at 11/06/15 1505  . ondansetron (ZOFRAN) injection 4 mg  4 mg Intravenous Q8H PRN Lavera Guise, MD        Allergies as of 11/06/2015 - Review Complete 11/06/2015  Allergen Reaction Noted  . Morphine and related Itching and Rash 01/19/2011    Family History  Problem Relation Age of Onset  . Diabetes Mother   . COPD Mother   . Heart disease Mother   . Breast cancer Paternal Grandmother   .  Lung cancer Paternal Grandfather     Social History   Social History  . Marital Status: Married    Spouse Name: N/A  . Number of Children: N/A  . Years of Education: N/A   Occupational History  . Not on file.   Social History Main Topics  . Smoking status: Current Every Day Smoker -- 1.00 packs/day for 20 years    Types: Cigarettes  . Smokeless tobacco: Never Used  . Alcohol Use: No     Comment: socially  . Drug Use: No  . Sexual Activity: Yes    Birth Control/ Protection: Surgical   Other Topics Concern  . Not on file   Social History Narrative    Review of Systems: See HPI, otherwise normal ROS  Physical Exam: Temp:  [97.7 F (36.5 C)-98.4 F (36.9 C)] 98.4 F (36.9 C) (05/17 1600) Pulse Rate:  [67-98] 72 (05/17 1600) Resp:  [18-22] 18 (05/17 1107) BP: (99-123)/(65-93) 120/84 mmHg (05/17 1600) SpO2:  [95 %-100 %] 100 % (05/17 1600) Weight:  [140 lb (63.504 kg)-146 lb 11.2 oz (66.543 kg)] 146 lb 11.2 oz (66.543 kg) (05/17 1600) Last BM Date: 11/06/15 Patient is alert and appears to be in some pain. Conjunctiva is pink. Sclera is nonicteric. Oropharyngeal mucosa is unremarkable. Neck without masses or thyromegaly. Cardiac exam with regular rhythm normal S1 and S2. No murmur or gallop noted. Lungs are clear to auscultation. Abdomen is symmetrical. Bowel sounds are hyperactive. On palpation abdomen is soft with mild to moderate tenderness at LLQ and LUQ without guarding or rebound. No organomegaly or masses. No peripheral edema or clubbing noted.    Lab Results:  Recent Labs  11/06/15 0900  WBC 15.4*  HGB 12.8  HCT 37.3  PLT 276   BMET  Recent Labs  11/06/15 0900  NA 139  K 3.2*  CL 111  CO2 19*  GLUCOSE 117*  BUN 20  CREATININE 0.90  CALCIUM 9.2   LFT  Recent Labs  11/06/15 0900  PROT 7.2  ALBUMIN 4.4  AST 27  ALT 40  ALKPHOS 69  BILITOT 0.6    Studies/Results: Ct Abdomen Pelvis W Contrast  11/06/2015  CLINICAL DATA:   Left-sided abdominal pain EXAM: CT ABDOMEN AND PELVIS WITH CONTRAST TECHNIQUE: Multidetector CT imaging of the abdomen and pelvis was performed using the standard protocol following bolus administration of intravenous contrast. CONTRAST:  ISOVUE-300 IOPAMIDOL (ISOVUE-300) INJECTION 61% COMPARISON:  10/18/2015 FINDINGS: Lower chest:  Lung bases are clear.  Normal heart size. Hepatobiliary: Normal liver.  Prior cholecystectomy. Pancreas: Normal. Spleen: Normal. Adrenals/Urinary Tract: Normal adrenal glands. Normal kidneys. No urolithiasis or obstructive uropathy. Normal bladder. Stomach/Bowel: No bowel dilatation to suggest obstruction. Relative bowel wall thickening involving the descending and sigmoid colon concerning for mild colitis. Multiple air-fluid levels in the transverse colon. No pneumatosis, pneumoperitoneum or portal venous gas. Vascular/Lymphatic: Normal caliber abdominal aorta. No lymphadenopathy. Reproductive: Prior hysterectomy.  No adnexal mass. Other: No fluid  collection or hematoma. Musculoskeletal: No acute osseous abnormality. No lytic or sclerotic osseous lesion. Small central disc protrusion at L5-S1. Bilateral facet arthropathy at L5-S1. IMPRESSION: 1. Relative bowel wall thickening involving the descending and sigmoid colon concerning for mild colitis which may be secondary to an infectious or inflammatory etiology. Electronically Signed   By: Elige KoHetal  Patel   On: 11/06/2015 13:32   I have reviewed current abdominopelvic CT with Dr. Grace IsaacWatts. There is fluid in the colon and several wall thickening to mucosa of descending and sigmoid colon.  Assessment;  Patient is 45 year old Caucasian female who presents with acute onset of nonbloody diarrhea and nausea vomiting and cramping abdominal pain. She has leukocytosis and CT suggest subtle changes of wall thickening involving descending and sigmoid colon. Acute symptoms are suggestive of gastroenteritis or infectious colitis. She was  discharged about 2 weeks ago from this facility for more or less similar symptoms except most of her pain was in epigastric region and EGD was negative for peptic ulcer disease or H. pylori gastritis. I believe it is near coincidence that her symptoms have relapsed within 2 weeks. Ischemic colitis is likes likely given CT findings. GI pathogen panel is pending.  Recommendations;  Will proceed with flexible sigmoidoscopy tomorrow unless GI pathogen panel is positive. I have taken the liberty of changing ondansetron schedule to every 4 hours as needed.   LOS: 0 days   Yarieliz Wasser U  11/06/2015, 6:33 PM

## 2015-11-06 NOTE — ED Notes (Signed)
MD at bedside. 

## 2015-11-06 NOTE — ED Notes (Signed)
Paged Dr.Rehmann to ext. [phone]

## 2015-11-06 NOTE — ED Provider Notes (Signed)
CSN: [id number]     Arrival date & time 11/06/15  0802 History  By signing my name below, I, Placido Sou, attest that this documentation has been prepared under the direction and in the presence of Lavera Guise, MD. Electronically Signed: Placido Sou, ED Scribe. 11/06/2015. 8:19 AM.   Chief Complaint  Patient presents with  . Emesis   The history is provided by the patient. No language interpreter was used.   HPI Comments: [name] is a 45 y.o. female with a PMHx of IBS, diverticula of intestine who presents to the Emergency Department complaining of worsening, moderate, diarrhea onset at 6:00 am. Pt was recently admitted for Colitis with similar associated symptoms and was d/c on 10/23/2015. She states that since being d/c she has continued to experience, waxing and waning, mild, abd swelling and beginning this morning states that she has began to experience 1x incontinence of her bowels upon waking and worsening, watery, diarrhea and vomiting. Pt states in her prior occurrence her pain was located in her epigastric region and is now more focused in her suprapubic region. She denies taking any medications for her ulcerative colitis but states that she was recently rx Protonix. She denies any sick contacts. She denies bloody stools, hematemesis, fever, chills, dysuria and increased urinary frequency.    Past Medical History  Diagnosis Date  . Anxiety   . IBS (irritable bowel syndrome)   . Thyroid disease   . Diverticula of intestine   . Ulcerative colitis   . Migraines   . Vertigo    Past Surgical History  Procedure Laterality Date  . Abdominal hysterectomy    . Appendectomy    . Cholecystectomy    . Knee arthroscopy    . Esophagogastroduodenoscopy N/A 10/23/2015    Procedure: ESOPHAGOGASTRODUODENOSCOPY (EGD);  Surgeon: Malissa Hippo, MD;  Location: [facility];  Service: Endoscopy;  Laterality: N/A;   Family History  Problem Relation Age of Onset  . Diabetes Mother    . COPD Mother   . Heart disease Mother   . Breast cancer Paternal Grandmother   . Lung cancer Paternal Grandfather    Social History  Substance Use Topics  . Smoking status: Current Every Day Smoker -- 1.00 packs/day for 20 years    Types: Cigarettes  . Smokeless tobacco: Never Used  . Alcohol Use: No     Comment: socially   OB History    No data available     Review of Systems  Constitutional: Negative for fever and chills.  Gastrointestinal: Positive for nausea, vomiting, abdominal pain and diarrhea. Negative for constipation, blood in stool and anal bleeding.  Genitourinary: Negative for dysuria and frequency.  All other systems reviewed and are negative.  Allergies  Morphine and related  Home Medications   Prior to Admission medications   Medication Sig Start Date End Date Taking? Authorizing Provider  diazepam (VALIUM) 10 MG tablet Take 10 mg by mouth every 6 (six) hours as needed for anxiety.  10/07/15  Yes Historical Provider, MD  FLUoxetine (PROZAC) 40 MG capsule TK 1 C PO QD 10/02/15  Yes Historical Provider, MD  levothyroxine (SYNTHROID, LEVOTHROID) 112 MCG tablet Take 112 mcg by mouth daily. 04/17/15  Yes Historical Provider, MD  pantoprazole (PROTONIX) 40 MG tablet Take 1 tablet (40 mg total) by mouth 2 (two) times daily. 10/23/15  Yes Erick Blinks, MD  vitamin B-12 (CYANOCOBALAMIN) 100 MCG tablet Take 100 mcg by mouth daily.   Yes Historical Provider, MD  BP 117/93 mmHg  Pulse 71  Temp(Src) 97.7 F (36.5 C) (Oral)  Resp 18  Ht 5\' 6"  (1.676 m)  Wt 140 lb (63.504 kg)  BMI 22.61 kg/m2  SpO2 98% Physical Exam Nursing note and vitals reviewed. Constitutional: Well developed, well nourished, non-toxic, and in mild distress secondary to pain.  Head: Normocephalic and atraumatic.  Mouth/Throat: Oropharynx is clear and moist.  Neck: Normal range of motion. Neck supple.  Cardiovascular: Normal rate and regular rhythm.   Pulmonary/Chest: Effort normal and breath  sounds normal.  Abdominal: Soft. There is suprapubic and left sided tenderness. There is no rebound and no guarding. No distension.  Musculoskeletal: Normal range of motion.  Neurological: Alert, no facial droop, fluent speech, moves all extremities symmetrically Skin: Skin is warm and dry.  Psychiatric: Cooperative ED Course  Procedures  DIAGNOSTIC STUDIES: Oxygen Saturation is 97% on RA, normal by my interpretation.    COORDINATION OF CARE: 8:17 AM Discussed next steps with pt. She verbalized understanding and is agreeable with the plan.   Labs Review Labs Reviewed  CBC WITH DIFFERENTIAL/PLATELET - Abnormal; Notable for the following:    WBC 15.4 (*)    Neutro Abs 12.6 (*)    All other components within normal limits  COMPREHENSIVE METABOLIC PANEL - Abnormal; Notable for the following:    Potassium 3.2 (*)    CO2 19 (*)    Glucose, Bld 117 (*)    All other components within normal limits  LIPASE, BLOOD - Abnormal; Notable for the following:    Lipase 54 (*)    All other components within normal limits  GASTROINTESTINAL PANEL BY PCR, STOOL (REPLACES STOOL CULTURE)  URINALYSIS, ROUTINE W REFLEX MICROSCOPIC (NOT AT [facility])  I-STAT BETA HCG BLOOD, ED ([facility], [facility], [facility] ONLY)    Imaging Review Ct Abdomen Pelvis W Contrast  11/06/2015  CLINICAL DATA:  Left-sided abdominal pain EXAM: CT ABDOMEN AND PELVIS WITH CONTRAST TECHNIQUE: Multidetector CT imaging of the abdomen and pelvis was performed using the standard protocol following bolus administration of intravenous contrast. CONTRAST:  100mL ISOVUE-300 IOPAMIDOL (ISOVUE-300) INJECTION 61% COMPARISON:  10/18/2015 FINDINGS: Lower chest:  Lung bases are clear.  Normal heart size. Hepatobiliary: Normal liver.  Prior cholecystectomy. Pancreas: Normal. Spleen: Normal. Adrenals/Urinary Tract: Normal adrenal glands. Normal kidneys. No urolithiasis or obstructive uropathy. Normal bladder. Stomach/Bowel: No bowel dilatation to suggest obstruction.  Relative bowel wall thickening involving the descending and sigmoid colon concerning for mild colitis. Multiple air-fluid levels in the transverse colon. No pneumatosis, pneumoperitoneum or portal venous gas. Vascular/Lymphatic: Normal caliber abdominal aorta. No lymphadenopathy. Reproductive: Prior hysterectomy.  No adnexal mass. Other: No fluid collection or hematoma. Musculoskeletal: No acute osseous abnormality. No lytic or sclerotic osseous lesion. Small central disc protrusion at L5-S1. Bilateral facet arthropathy at L5-S1. IMPRESSION: 1. Relative bowel wall thickening involving the descending and sigmoid colon concerning for mild colitis which may be secondary to an infectious or inflammatory etiology. Electronically Signed   By: Elige KoHetal  Patel   On: 11/06/2015 13:32   I have personally reviewed and evaluated these images and lab results as part of my medical decision-making.   EKG Interpretation None      MDM   Final diagnoses:  Colitis   45 year old female with recent history of infectious colitis who presents with nausea, vomiting, diarrhea and abdominal pain since this morning. Vital signs are within normal limits on arrival, she appears well-hydrated. Abdomen very soft, but she seems to have significant pain over left hemiabdomen. Blood work  notable for new leukocytosis of 15. Suspect recurrent colitis, and discussed with Dr. [name] initially who recommended CT abd/pelvis. This still shows inflammation involving the descending and sigmoid colon concerning for mild colitis. At this time, Dr. [name] recommends repeat stool studies and admission for potential colonoscopy given that she has significant pain and symptoms. Did no recommend antibiotics at this time. Discussed with Dr. Kerry Hough who will admit for ongoing GI management.    I personally performed the services described in this documentation, which was scribed in my presence. The recorded information has been reviewed and is  accurate.   Lavera Guise, MD 11/06/15 1452

## 2015-11-06 NOTE — ED Notes (Signed)
Pt reports n/v/d and abd pain since 0330 this morning.  Reports was admitted for same 2 weeks ago.

## 2015-11-06 NOTE — ED Notes (Signed)
MD notified that pt is having pain under right rib.

## 2015-11-07 ENCOUNTER — Encounter (HOSPITAL_COMMUNITY)
Admission: EM | Disposition: A | Discharge status: Home / Self Care | Payer: Self-pay | Source: Home / Self Care | Attending: Emergency Medicine

## 2015-11-07 ENCOUNTER — Encounter (HOSPITAL_COMMUNITY): Payer: Self-pay

## 2015-11-07 DIAGNOSIS — K219 Gastro-esophageal reflux disease without esophagitis: Secondary | ICD-10-CM | POA: Diagnosis not present

## 2015-11-07 DIAGNOSIS — K529 Noninfective gastroenteritis and colitis, unspecified: Secondary | ICD-10-CM | POA: Diagnosis not present

## 2015-11-07 DIAGNOSIS — E876 Hypokalemia: Secondary | ICD-10-CM | POA: Diagnosis not present

## 2015-11-07 DIAGNOSIS — K210 Gastro-esophageal reflux disease with esophagitis: Secondary | ICD-10-CM | POA: Diagnosis not present

## 2015-11-07 DIAGNOSIS — K2900 Acute gastritis without bleeding: Secondary | ICD-10-CM | POA: Diagnosis not present

## 2015-11-07 DIAGNOSIS — R112 Nausea with vomiting, unspecified: Secondary | ICD-10-CM | POA: Diagnosis not present

## 2015-11-07 DIAGNOSIS — R197 Diarrhea, unspecified: Secondary | ICD-10-CM | POA: Diagnosis not present

## 2015-11-07 DIAGNOSIS — D125 Benign neoplasm of sigmoid colon: Secondary | ICD-10-CM | POA: Diagnosis not present

## 2015-11-07 DIAGNOSIS — E039 Hypothyroidism, unspecified: Secondary | ICD-10-CM | POA: Diagnosis not present

## 2015-11-07 HISTORY — PX: FLEXIBLE SIGMOIDOSCOPY: SHX5431

## 2015-11-07 LAB — BASIC METABOLIC PANEL
Anion gap: 5 (ref 5–15)
BUN: 11 mg/dL (ref 6–20)
CALCIUM: 8.4 mg/dL — AB (ref 8.9–10.3)
CHLORIDE: 113 mmol/L — AB (ref 101–111)
CO2: 22 mmol/L (ref 22–32)
CREATININE: 0.82 mg/dL (ref 0.44–1.00)
GFR calc Af Amer: >60 mL/min (ref >60)
GFR calc non Af Amer: >60 mL/min (ref >60)
GLUCOSE: 93 mg/dL (ref 65–99)
Potassium: 3.7 mmol/L (ref 3.5–5.1)
Sodium: 140 mmol/L (ref 135–145)

## 2015-11-07 LAB — CBC
HEMATOCRIT: 32.6 % — AB (ref 36.0–46.0)
HEMOGLOBIN: 10.8 g/dL — AB (ref 12.0–15.0)
MCH: 31.9 pg (ref 26.0–34.0)
MCHC: 33.1 g/dL (ref 30.0–36.0)
MCV: 96.2 fL (ref 78.0–100.0)
Platelets: 222 10*3/uL (ref 150–400)
RBC: 3.39 MIL/uL — AB (ref 3.87–5.11)
RDW: 12.9 % (ref 11.5–15.5)
WBC: 6.6 10*3/uL (ref 4.0–10.5)

## 2015-11-07 LAB — MAGNESIUM: Magnesium: 1.9 mg/dL (ref 1.7–2.4)

## 2015-11-07 SURGERY — SIGMOIDOSCOPY, FLEXIBLE
Anesthesia: Moderate Sedation

## 2015-11-07 MED ORDER — MIDAZOLAM HCL 5 MG/5ML IJ SOLN
INTRAMUSCULAR | Status: AC
  Filled 2015-11-07: qty 10

## 2015-11-07 MED ORDER — MEPERIDINE HCL 50 MG/ML IJ SOLN
INTRAMUSCULAR | Status: AC
  Filled 2015-11-07: qty 1

## 2015-11-07 MED ORDER — STERILE WATER FOR IRRIGATION IR SOLN
Status: DC | PRN
  Administered 2015-11-07: 16:00:00

## 2015-11-07 MED ORDER — SODIUM CHLORIDE 0.9 % IV SOLN: INTRAVENOUS | Status: DC

## 2015-11-07 MED ORDER — DICYCLOMINE HCL 10 MG PO CAPS
10.0000 mg | ORAL_CAPSULE | Freq: Three times a day (TID) | ORAL | Status: DC
  Administered 2015-11-07 – 2015-11-10 (×8): 10 mg via ORAL
  Filled 2015-11-07 (×9): qty 1

## 2015-11-07 MED ORDER — PROMETHAZINE HCL 25 MG/ML IJ SOLN
INTRAMUSCULAR | Status: AC
  Filled 2015-11-07: qty 1

## 2015-11-07 MED ORDER — MIDAZOLAM HCL 5 MG/5ML IJ SOLN
INTRAMUSCULAR | Status: DC | PRN
  Administered 2015-11-07 (×4): 2 mg via INTRAVENOUS

## 2015-11-07 MED ORDER — MEPERIDINE HCL 25 MG/ML IJ SOLN
INTRAMUSCULAR | Status: DC | PRN
  Administered 2015-11-07: 15 mg via INTRAVENOUS
  Administered 2015-11-07: 10 mg via INTRAVENOUS
  Administered 2015-11-07: 25 mg via INTRAVENOUS

## 2015-11-07 NOTE — Progress Notes (Signed)
Pt. Received back to her room. Alert,oriented x 4. Denies any discomfort at this time. Up to bathroom to void without difficulty. Family at bedside.

## 2015-11-07 NOTE — Progress Notes (Signed)
Pt. To Endo via wheelchair.

## 2015-11-07 NOTE — Progress Notes (Signed)
PROGRESS NOTE    Alyssa FinnerChristal [name]  BJY:[id number] DOB: 06/13/1971 DOA: 11/06/2015 PCP: Ernestine ConradBLUTH, KIRK, MD  Brief Narrative49:  45 yof with medical history of HTN, hypothyroidism presented with complaints of fecal incontinence with persistent stools and associated vomiting and abdominal cramping. In the ED noted to have mild hypokalemia and mild metabolic acidosis. CT scan revealed possible colitis. She has been referred for admission.    Assessment & Plan: Active Problems:   Anxiety disorder   Hypokalemia   Hypothyroidism, adult   Colitis   Vomiting and diarrhea   GERD (gastroesophageal reflux disease)  1. Colitis, likely infectious. Initially started on ciprofloxacin and Flagyl. GI pathogen panel in process. Continue contact precautions. Gastroenterology has consulted. Follow up sigmoidoscopy results.  2. Nausea, vomiting, diarrhea. Nausea improving. Vomiting and diarrhea resolved. Continue to treat supportively with antiemetics. Resume clear liquids after procedure and advance diet as tolerated. 3. GERD. Continue PPI. 4. Hypothyroidism. Continue Synthroid 5. Hypokalemia. Likely related to GI losses. Resolved. Magnesium within normal limits.  6. Anxiety disorder. Continue home dose of Valium.   DVT prophylaxis:Lovenox Code Status: Full Family Communication: No family bedside Disposition Plan: Discharge home within 1-2 days.    Consultants:   Gastroenterology   Procedures:   None  Antimicrobials:   Flagyl 5/17>>  Cipro 5/17>>   Subjective: Feeling a little better then yesterday multiple stools overnight. Improved with imodium. Still has nausea but no vomiting. Was able to tolerate the small amount of liquids that she was able to drink.  Objective: Filed Vitals:   11/06/15 1430 11/06/15 1500 11/06/15 1600 11/06/15 2201  BP: 104/78 116/87 120/84 119/75  Pulse: 69 67 72 67  Temp:   98.4 F (36.9 C) 97.8 F (36.6 C)  TempSrc:    Oral  Resp:    20  Height:   5\' 6"   (1.676 m)   Weight:   66.543 kg (146 lb 11.2 oz)   SpO2: 99% 97% 100% 100%   No intake or output data in the 24 hours ending 11/07/15 0819 Filed Weights   11/06/15 0808 11/06/15 1600  Weight: 63.504 kg (140 lb) 66.543 kg (146 lb 11.2 oz)    Examination:  General exam: Appears calm and comfortable  Respiratory system: Clear to auscultation. Respiratory effort normal. Cardiovascular system: S1 & S2 heard, RRR. No JVD, murmurs, rubs, gallops or clicks. No pedal edema. Gastrointestinal system: LLQ mild tenderness. nondistended, soft and nontender. No organomegaly or masses felt. Normal bowel sounds heard. Central nervous system: Alert and oriented. No focal neurological deficits. Extremities: Symmetric 5 x 5 power. Skin: No rashes, lesions or ulcers Psychiatry: Judgement and insight appear normal. Mood & affect appropriate.     Data Reviewed: I have personally reviewed following labs and imaging studies  CBC:  Recent Labs Lab 11/06/15 0900 11/06/15 2200 11/07/15 0559  WBC 15.4* 7.2 6.6  NEUTROABS 12.6*  --   --   HGB 12.8 11.5* 10.8*  HCT 37.3 33.6* 32.6*  MCV 92.3 94.4 96.2  PLT 276 239 222   Basic Metabolic Panel:  Recent Labs Lab 11/06/15 0900 11/06/15 2200 11/07/15 0559  NA 139  --  140  K 3.2*  --  3.7  CL 111  --  113*  CO2 19*  --  22  GLUCOSE 117*  --  93  BUN 20  --  11  CREATININE 0.90 0.84 0.82  CALCIUM 9.2  --  8.4*  MG  --   --  1.9  Recent Labs Lab 11/06/15 0900  AST 27  ALT 40  ALKPHOS 69  BILITOT 0.6  PROT 7.2  ALBUMIN 4.4    Recent Labs Lab 11/06/15 0900  LIPASE 54*      Component Value Date/Time   COLORURINE YELLOW 11/06/2015 1108   APPEARANCEUR CLEAR 11/06/2015 1108   LABSPEC 1.020 11/06/2015 1108   PHURINE 5.5 11/06/2015 1108   GLUCOSEU NEGATIVE 11/06/2015 1108   HGBUR NEGATIVE 11/06/2015 1108   BILIRUBINUR NEGATIVE 11/06/2015 1108   KETONESUR NEGATIVE 11/06/2015 1108   PROTEINUR NEGATIVE 11/06/2015 1108    UROBILINOGEN 0.2 02/28/2015 2315   NITRITE NEGATIVE 11/06/2015 1108   LEUKOCYTESUR NEGATIVE 11/06/2015 1108      Radiology Studies: Ct Abdomen Pelvis W Contrast  11/06/2015  CLINICAL DATA:  Left-sided abdominal pain EXAM: CT ABDOMEN AND PELVIS WITH CONTRAST TECHNIQUE: Multidetector CT imaging of the abdomen and pelvis was performed using the standard protocol following bolus administration of intravenous contrast. CONTRAST:  ISOVUE-300 IOPAMIDOL (ISOVUE-300) INJECTION 61% COMPARISON:  10/18/2015 FINDINGS: Lower chest:  Lung bases are clear.  Normal heart size. Hepatobiliary: Normal liver.  Prior cholecystectomy. Pancreas: Normal. Spleen: Normal. Adrenals/Urinary Tract: Normal adrenal glands. Normal kidneys. No urolithiasis or obstructive uropathy. Normal bladder. Stomach/Bowel: No bowel dilatation to suggest obstruction. Relative bowel wall thickening involving the descending and sigmoid colon concerning for mild colitis. Multiple air-fluid levels in the transverse colon. No pneumatosis, pneumoperitoneum or portal venous gas. Vascular/Lymphatic: Normal caliber abdominal aorta. No lymphadenopathy. Reproductive: Prior hysterectomy.  No adnexal mass. Other: No fluid collection or hematoma. Musculoskeletal: No acute osseous abnormality. No lytic or sclerotic osseous lesion. Small central disc protrusion at L5-S1. Bilateral facet arthropathy at L5-S1. IMPRESSION: 1. Relative bowel wall thickening involving the descending and sigmoid colon concerning for mild colitis which may be secondary to an infectious or inflammatory etiology. Electronically Signed   By: Elige Ko   On: 11/06/2015 13:32        Scheduled Meds: . ciprofloxacin  400 mg Intravenous Q12H  . enoxaparin (LOVENOX) injection  40 mg Subcutaneous Q24H  . FLUoxetine  40 mg Oral Daily  . levothyroxine  112 mcg Oral QAC breakfast  . metronidazole  500 mg Intravenous Q8H  . pantoprazole  40 mg Oral BID  . sodium phosphate  2 enema  Rectal Once  . vitamin B-12  100 mcg Oral Daily   Continuous Infusions: . 0.9 % NaCl with KCl 40 mEq / L 100 mL/hr (11/06/15 2026)     LOS: 1 day    Time spent: 20 minutes     Erick Blinks, MD  [facility] Pager [phone]  If 7PM-7AM, please contact night-coverage [email or web address] Password [email or web address] 11/07/2015, 8:19 AM     By signing my name below, I, Zadie Cleverly, attest that this documentation has been prepared under the direction and in the presence of Erick Blinks, MD. Electronically signed: Zadie Cleverly, Scribe. 11/07/2015 1:43pm   I, Dr. Erick Blinks, personally performed the services described in this documentaiton. All medical record entries made by the scribe were at my direction and in my presence. I have reviewed the chart and agree that the record reflects my personal performance and is accurate and complete  Erick Blinks, MD, 11/07/2015 1:51 PM

## 2015-11-07 NOTE — Op Note (Signed)
[facility] Patient Name: Alyssa FinnerChristal Aston Procedure Date: 11/07/2015 4:07 PM MRN: [id number] Date of Birth: 08/02/1970 Attending MD: Lionel DecemberNajeeb  Ducre , MD CSN: [id number] Age: [age] Admit Type: Outpatient Procedure:                Flexible Sigmoidoscopy Indications:              Diarrhea, Abnormal CT of the GI tract Providers:                Lionel DecemberNajeeb Dearia Wilmouth, MD, Edrick Kinsammy Vaught, RN, Calton Dachaylor Lemons,                            Technician Referring MD:             Erick BlinksJehanzeb Memon, MD Medicines:                Meperidine 50 mg IV, Midazolam 8 mg IV Complications:            No immediate complications. Estimated Blood Loss:     Estimated blood loss was minimal. Procedure:                Pre-Anesthesia Assessment:                           - Prior to the procedure, a History and Physical                            was performed, and patient medications and                            allergies were reviewed. The patient's tolerance of                            previous anesthesia was also reviewed. The risks                            and benefits of the procedure and the sedation                            options and risks were discussed with the patient.                            All questions were answered, and informed consent                            was obtained. Prior Anticoagulants: The patient                            last took Lovenox (enoxaparin) 1 day prior to the                            procedure. ASA Grade Assessment: II - A patient                            with mild systemic disease. After reviewing the  risks and benefits, the patient was deemed in                            satisfactory condition to undergo the procedure.                           After obtaining informed consent, the scope was                            passed under direct vision. The was introduced                            through the anus and advanced to the the descending                             colon. The flexible sigmoidoscopy was accomplished                            without difficulty. The patient tolerated the                            procedure fairly well. The quality of the bowel                            preparation was adequate. Scope In: 4:17:01 PM Scope Out: 4:28:37 PM Total Procedure Duration: 0 hours 11 minutes 36 seconds  Findings:      The rectum, recto-sigmoid colon, sigmoid colon, descending colon and       distal descending colon appeared normal.      The sigmoid colon appeared normal. Biopsies were taken with a cold       forceps for histology. Impression:               - The rectum, recto-sigmoid colon, sigmoid colon,                            descending colon and distal descending colon are                            normal.                           - Random biopsies taken from mucosa of sigmoid                            colon. Moderate Sedation:      Moderate (conscious) sedation was administered by the endoscopy nurse       and supervised by the endoscopist. The following parameters were       monitored: oxygen saturation, heart rate, blood pressure, CO2       capnography and response to care. Total physician intraservice time was       18 minutes. Recommendation:           - Return patient to hospital ward for ongoing care.                           -  Full liquid diet today.                           - Use Bentyl (dicyclomine) 10 mg PO TID 30 min AC                            today. Procedure Code(s):        --- Professional ---                           [phone], Sigmoidoscopy, flexible; with biopsy, single                            or multiple                           99152, Moderate sedation services provided by the                            same physician or other qualified health care                            professional performing the diagnostic or                            therapeutic service that the  sedation supports,                            requiring the presence of an independent trained                            observer to assist in the monitoring of the                            patient's level of consciousness and physiological                            status; initial 15 minutes of intraservice time,                            patient age 23 years or older Diagnosis Code(s):        --- Professional ---                           R19.7, Diarrhea, unspecified                           R93.3, Abnormal findings on diagnostic imaging of                            other parts of digestive tract CPT copyright 2016 [employer]. All rights reserved. The codes documented in this report are preliminary and upon coder review may  be revised to meet current compliance requirements. Lionel December, MD Lionel December, MD 11/07/2015 4:41:57 PM This report has been signed electronically. Number of Addenda: 0

## 2015-11-08 DIAGNOSIS — K529 Noninfective gastroenteritis and colitis, unspecified: Secondary | ICD-10-CM | POA: Diagnosis not present

## 2015-11-08 DIAGNOSIS — F419 Anxiety disorder, unspecified: Secondary | ICD-10-CM | POA: Diagnosis not present

## 2015-11-08 DIAGNOSIS — K2900 Acute gastritis without bleeding: Secondary | ICD-10-CM | POA: Diagnosis not present

## 2015-11-08 DIAGNOSIS — R112 Nausea with vomiting, unspecified: Secondary | ICD-10-CM | POA: Diagnosis not present

## 2015-11-08 DIAGNOSIS — R197 Diarrhea, unspecified: Secondary | ICD-10-CM | POA: Diagnosis not present

## 2015-11-08 DIAGNOSIS — K219 Gastro-esophageal reflux disease without esophagitis: Secondary | ICD-10-CM | POA: Diagnosis not present

## 2015-11-08 DIAGNOSIS — K210 Gastro-esophageal reflux disease with esophagitis: Secondary | ICD-10-CM | POA: Diagnosis not present

## 2015-11-08 DIAGNOSIS — E876 Hypokalemia: Secondary | ICD-10-CM | POA: Diagnosis not present

## 2015-11-08 LAB — GASTROINTESTINAL PANEL BY PCR, STOOL (REPLACES STOOL CULTURE)
ADENOVIRUS F40/41: NOT DETECTED
ASTROVIRUS: NOT DETECTED
CAMPYLOBACTER SPECIES: NOT DETECTED
Cryptosporidium: NOT DETECTED
Cyclospora cayetanensis: NOT DETECTED
E. coli O157: NOT DETECTED
ENTEROAGGREGATIVE E COLI (EAEC): NOT DETECTED
ENTEROPATHOGENIC E COLI (EPEC): NOT DETECTED
ENTEROTOXIGENIC E COLI (ETEC): NOT DETECTED
Entamoeba histolytica: NOT DETECTED
GIARDIA LAMBLIA: NOT DETECTED
NOROVIRUS GI/GII: NOT DETECTED
PLESIMONAS SHIGELLOIDES: NOT DETECTED
ROTAVIRUS A: NOT DETECTED
Salmonella species: NOT DETECTED
Sapovirus (I, II, IV, and V): NOT DETECTED
Shiga like toxin producing E coli (STEC): NOT DETECTED
Shigella/Enteroinvasive E coli (EIEC): NOT DETECTED
Vibrio cholerae: NOT DETECTED
Vibrio species: NOT DETECTED
Yersinia enterocolitica: NOT DETECTED

## 2015-11-08 LAB — CBC
HEMATOCRIT: 31.7 % — AB (ref 36.0–46.0)
HEMOGLOBIN: 10.8 g/dL — AB (ref 12.0–15.0)
MCH: 32.1 pg (ref 26.0–34.0)
MCHC: 34.1 g/dL (ref 30.0–36.0)
MCV: 94.3 fL (ref 78.0–100.0)
Platelets: 207 10*3/uL (ref 150–400)
RBC: 3.36 MIL/uL — AB (ref 3.87–5.11)
RDW: 13 % (ref 11.5–15.5)
WBC: 6.9 10*3/uL (ref 4.0–10.5)

## 2015-11-08 LAB — BASIC METABOLIC PANEL
ANION GAP: 6 (ref 5–15)
BUN: 6 mg/dL (ref 6–20)
CHLORIDE: 110 mmol/L (ref 101–111)
CO2: 23 mmol/L (ref 22–32)
Calcium: 8.3 mg/dL — ABNORMAL LOW (ref 8.9–10.3)
Creatinine, Ser: 0.81 mg/dL (ref 0.44–1.00)
GFR calc non Af Amer: >60 mL/min (ref >60)
GLUCOSE: 94 mg/dL (ref 65–99)
POTASSIUM: 3.6 mmol/L (ref 3.5–5.1)
Sodium: 139 mmol/L (ref 135–145)

## 2015-11-08 MED ORDER — PROMETHAZINE HCL 12.5 MG PO TABS
25.0000 mg | ORAL_TABLET | Freq: Three times a day (TID) | ORAL | Status: DC | PRN
  Administered 2015-11-08 – 2015-11-10 (×6): 25 mg via ORAL
  Filled 2015-11-08 (×7): qty 2

## 2015-11-08 MED ORDER — LOPERAMIDE HCL 2 MG PO CAPS
2.0000 mg | ORAL_CAPSULE | Freq: Three times a day (TID) | ORAL | Status: DC
  Administered 2015-11-08 – 2015-11-10 (×6): 2 mg via ORAL
  Filled 2015-11-08 (×8): qty 1

## 2015-11-08 MED ORDER — OXYCODONE-ACETAMINOPHEN 5-325 MG PO TABS
1.0000 | ORAL_TABLET | ORAL | Status: DC | PRN
  Administered 2015-11-08: 2 via ORAL
  Administered 2015-11-08 – 2015-11-09 (×3): 1 via ORAL
  Administered 2015-11-09 – 2015-11-10 (×6): 2 via ORAL
  Administered 2015-11-10: 1 via ORAL
  Filled 2015-11-08: qty 1
  Filled 2015-11-08 (×6): qty 2
  Filled 2015-11-08: qty 1
  Filled 2015-11-08 (×3): qty 2

## 2015-11-08 NOTE — Progress Notes (Signed)
PROGRESS NOTE    [name]  ZOX:[id number] DOB: Mar 13, 1971 DOA: 11/06/2015 PCP: [name], MD  Brief Narrative:  61 yof with medical history of HTN, hypothyroidism presented with complaints of fecal incontinence with persistent stools and associated vomiting and abdominal cramping. In the ED noted to have mild hypokalemia and mild metabolic acidosis. CT scan revealed possible colitis. She has been referred for admission.   Assessment & Plan: Active Problems:   Anxiety disorder   Hypokalemia   Hypothyroidism, adult   Colitis   Vomiting and diarrhea   GERD (gastroesophageal reflux disease)  1. Colitis, likely infectious. Initially started on ciprofloxacin and Flagyl. GI pathogen panel in process. Continue contact precautions. Gastroenterology has consulted and preformed sigmoidoscopy 5/18 which was largely unremarkable. Follow up biopsy results. Per GI recommendation full liquid diet.  2. Nausea, vomiting, diarrhea. Vomiting has resolved but still has moderate nausea. zofran changed to phenergan. Continue to treat supportively with antiemetics. Also started on imodium for diarrhea. 3. GERD. Continue PPI. 4. Hypothyroidism. Continue Synthroid 5. Hypokalemia. Likely related to GI losses. Resolved.   6. Anxiety disorder. Continue home dose of Valium.   DVT prophylaxis:Lovenox Code Status: Full Family Communication: No family bedside Disposition Plan: Discharge home within 1-2 days.    Consultants:   Gastroenterology   Procedures:   Flexible sigmoidoscopy 5/18  Antimicrobials:   Flagyl 5/17>>  Cipro 5/17>>   Subjective: Feels a little better today. Still having diarrhea overnight. No vomiting, but still having nausea. Able to keep down full liquids  Objective: Filed Vitals:   11/07/15 1640 11/07/15 2210 11/08/15 0106 11/08/15 0629  BP:   Pulse: 61 59 62 54  Temp:  98.4 F (36.9 C) 98.5 F (36.9 C) 98 F (36.7 C)  TempSrc:  Oral Oral Oral    Resp: Height:      Weight:      SpO2: 98% 98% 94% 99%    Intake/Output Summary (Last 24 hours) at 11/08/15 0751 Last data filed at 11/07/15 1634  Gross per 24 hour  Intake    300 ml  Output      0 ml  Net    300 ml   Filed Weights   11/06/15 0808 11/06/15 1600  Weight: 63.504 kg (140 lb) 66.543 kg (146 lb 11.2 oz)    Examination:  General exam: Appears calm and comfortable  Respiratory system: Clear to auscultation. Respiratory effort normal. Cardiovascular system: S1 & S2 heard, RRR. No JVD, murmurs, rubs, gallops or clicks. No pedal edema. Gastrointestinal system: LLQ mild tenderness. nondistended, soft and nontender. No organomegaly or masses felt. Normal bowel sounds heard. Central nervous system: Alert and oriented. No focal neurological deficits. Extremities: Symmetric 5 x 5 power. Skin: No rashes, lesions or ulcers Psychiatry: Judgement and insight appear normal. Mood & affect appropriate.     Data Reviewed: I have personally reviewed following labs and imaging studies  CBC:  Recent Labs Lab 11/06/15 0900 11/06/15 2200 11/07/15 0559 11/08/15 0635  WBC 15.4* 7.2 6.6 6.9  NEUTROABS 12.6*  --   --   --   HGB 12.8 11.5* 10.8* 10.8*  HCT 37.3 33.6* 32.6* 31.7*  MCV 92.3 94.4 96.2 94.3  PLT 276 239 222 207   Basic Metabolic Panel:  Recent Labs Lab 11/06/15 0900 11/06/15 2200 11/07/15 0559 11/08/15 0635  NA 139  --  140 139  K 3.2*  --  3.7 3.6  CL 111  --  113* 110  CO2 19*  --  22 23  GLUCOSE 117*  --  93 94  BUN 20  --  11 6  CREATININE 0.90 0.84 0.82 0.81  CALCIUM 9.2  --  8.4* 8.3*  MG  --   --  1.9  --      Recent Labs Lab 11/06/15 0900  AST 27  ALT 40  ALKPHOS 69  BILITOT 0.6  PROT 7.2  ALBUMIN 4.4    Recent Labs Lab 11/06/15 0900  LIPASE 54*      Component Value Date/Time   COLORURINE YELLOW 11/06/2015 1108   APPEARANCEUR CLEAR 11/06/2015 1108   LABSPEC 1.020 11/06/2015 1108   PHURINE 5.5 11/06/2015 1108    GLUCOSEU NEGATIVE 11/06/2015 1108   HGBUR NEGATIVE 11/06/2015 1108   BILIRUBINUR NEGATIVE 11/06/2015 1108   KETONESUR NEGATIVE 11/06/2015 1108   PROTEINUR NEGATIVE 11/06/2015 1108   UROBILINOGEN 0.2 02/28/2015 2315   NITRITE NEGATIVE 11/06/2015 1108   LEUKOCYTESUR NEGATIVE 11/06/2015 1108      Radiology Studies: Ct Abdomen Pelvis W Contrast  11/06/2015  CLINICAL DATA:  Left-sided abdominal pain EXAM: CT ABDOMEN AND PELVIS WITH CONTRAST TECHNIQUE: Multidetector CT imaging of the abdomen and pelvis was performed using the standard protocol following bolus administration of intravenous contrast. CONTRAST:  100mL ISOVUE-300 IOPAMIDOL (ISOVUE-300) INJECTION 61% COMPARISON:  10/18/2015 FINDINGS: Lower chest:  Lung bases are clear.  Normal heart size. Hepatobiliary: Normal liver.  Prior cholecystectomy. Pancreas: Normal. Spleen: Normal. Adrenals/Urinary Tract: Normal adrenal glands. Normal kidneys. No urolithiasis or obstructive uropathy. Normal bladder. Stomach/Bowel: No bowel dilatation to suggest obstruction. Relative bowel wall thickening involving the descending and sigmoid colon concerning for mild colitis. Multiple air-fluid levels in the transverse colon. No pneumatosis, pneumoperitoneum or portal venous gas. Vascular/Lymphatic: Normal caliber abdominal aorta. No lymphadenopathy. Reproductive: Prior hysterectomy.  No adnexal mass. Other: No fluid collection or hematoma. Musculoskeletal: No acute osseous abnormality. No lytic or sclerotic osseous lesion. Small central disc protrusion at L5-S1. Bilateral facet arthropathy at L5-S1. IMPRESSION: 1. Relative bowel wall thickening involving the descending and sigmoid colon concerning for mild colitis which may be secondary to an infectious or inflammatory etiology. Electronically Signed   By: Elige KoHetal  Patel   On: 11/06/2015 13:32        Scheduled Meds: . ciprofloxacin  400 mg Intravenous Q12H  . dicyclomine  10 mg Oral TID AC  . enoxaparin  (LOVENOX) injection  40 mg Subcutaneous Q24H  . FLUoxetine  40 mg Oral Daily  . levothyroxine  112 mcg Oral QAC breakfast  . metronidazole  500 mg Intravenous Q8H  . pantoprazole  40 mg Oral BID  . vitamin B-12  100 mcg Oral Daily   Continuous Infusions: . sodium chloride    . 0.9 % NaCl with KCl 40 mEq / L 100 mL/hr (11/06/15 2026)     LOS: 2 days    Time spent: 25minutes     Nysir Fergusson, MD  [facility] Pager [phone]  If 7PM-7AM, please contact night-coverage [email or web address] Password [email or web address] 11/08/2015, 7:51 AM

## 2015-11-08 NOTE — Progress Notes (Signed)
Patient received 1 pain tablet (Percocet 1651).  Patient reports no pain relief.  Dr. Kerry HoughMemon notified.  Dr. Kerry HoughMemon gave order for patient to receive 1 additional tablet (order is for 1-2 tabs).  Patient not to receive any more until 4 hours from second tablet.  Dr. Kerry HoughMemon also gave order to d/c enteric due to GI panel results (none detected on all).

## 2015-11-08 NOTE — Progress Notes (Signed)
  Subjective:  Patient states she is not vomiting anymore but remains with intense nausea. She states ondansetron has not helped. She continues complain of intermittent cramping across lower abdomen described intense just before she has a bowel movement. She's had 12 small volume loose stools this morning. She denies melena or rectal bleeding   Objective: Blood pressure 93/54, pulse 54, temperature 98 F (36.7 C), temperature source Oral, resp. rate 21, height 5\' 6"  (1.676 m), weight 146 lb 11.2 oz (66.543 kg), SpO2 99 %. Patient is alert and does not appear to be in any distress. Abdomen is symmetrical. Bowel sounds are hyperactive. Abdomen is soft with mild midepigastric tenderness. She has mild to moderate tenderness in LLQ. Organomegaly or masses.  No LE edema or clubbing noted.  Labs/studies Results:   Recent Labs  11/06/15 2200 11/07/15 0559 11/08/15 0635  WBC 7.2 6.6 6.9  HGB 11.5* 10.8* 10.8*  HCT 33.6* 32.6* 31.7*  PLT 239 222 207    BMET   Recent Labs  11/06/15 0900 11/06/15 2200 11/07/15 0559 11/08/15 0635  NA 139  --  140 139  K 3.2*  --  3.7 3.6  CL 111  --  113* 110  CO2 19*  --  22 23  GLUCOSE 117*  --  93 94  BUN 20  --  11 6  CREATININE 0.90 0.84 0.82 0.81  CALCIUM 9.2  --  8.4* 8.3*    LFT   Recent Labs  11/06/15 0900  PROT 7.2  ALBUMIN 4.4  AST 27  ALT 40  ALKPHOS 69  BILITOT 0.6    GI pathogen panel is pending. Sigmoid colon biopsy negative.  Assessment:  #1.Acute syndrome with nausea vomiting abdominal pain and nonbloody diarrhea. Vomiting has stopped but other symptoms persist leukocytosis has resolved. Flexible sigmoidoscopy yesterday was unremarkable. Sigmoid colon biopsy results are pending. Patient is on Cipro and metronidazole IV empirically. #2. Mild anemia secondary to acute illness. .   Recommendations:  Continue full liquids. Continue dicyclomine at 10 mg by mouth before each meal. Discontinue ondansetron as it is not  working. Promethazine 25 mg by mouth 3 times a day when necessary. Loperamide 2 mg by mouth 3 times a day 6 doses.  Dr. Darrick PennaFields will see patient over the weekend.

## 2015-11-09 DIAGNOSIS — K219 Gastro-esophageal reflux disease without esophagitis: Secondary | ICD-10-CM | POA: Diagnosis not present

## 2015-11-09 DIAGNOSIS — K529 Noninfective gastroenteritis and colitis, unspecified: Secondary | ICD-10-CM | POA: Diagnosis not present

## 2015-11-09 DIAGNOSIS — F419 Anxiety disorder, unspecified: Secondary | ICD-10-CM | POA: Diagnosis not present

## 2015-11-09 DIAGNOSIS — E876 Hypokalemia: Secondary | ICD-10-CM | POA: Diagnosis not present

## 2015-11-09 MED ORDER — PANTOPRAZOLE SODIUM 40 MG PO TBEC
40.0000 mg | DELAYED_RELEASE_TABLET | Freq: Two times a day (BID) | ORAL | Status: DC
  Administered 2015-11-09 – 2015-11-10 (×2): 40 mg via ORAL
  Filled 2015-11-09 (×2): qty 1

## 2015-11-09 MED ORDER — RISAQUAD PO CAPS
2.0000 | ORAL_CAPSULE | Freq: Every day | ORAL | Status: DC
Start: 2015-11-09 — End: 2015-11-10
  Administered 2015-11-09 – 2015-11-10 (×2): 2 via ORAL
  Filled 2015-11-09 (×2): qty 2

## 2015-11-09 NOTE — Progress Notes (Addendum)
Patient ID: [name], female   DOB: 04/14/1971, 45 y.o.   MRN: [id number]   Assessment/Plan: ADMITTED WITH NVD. HAD 20 BMs YESTERDAY. TODAY ONLY 3 AND TOLERATING DIET. LAST EGD WITH DUO Bx/TCS W/ Bx-2009-NL DUODENUM AND COLON. DIFFERENTIAL DIAGNOSIS INCLUDES: IBS-D, MICROSCOPIC COLITIS, OR SMALL INTESTINE BACTERIAL OVERGROWTH(SIBO).  PLAN: 1. STOP CIP/FLAGYL. CONSIDER HYDROGEN BREATH TEST TO EVALUATE FOR SIBO. NEEDS TO BE OFF ABX FOR 2 WEEKS PRIOR TO EXAM. 2. CONTINUE BENTYL &PHENERGAN. 3. ADD RISAQUAD. 4. ADVANCE TO LACTOSE FREE DIET   Subjective: Since last evaluated the patient HAS HAD 3 BMs. Her nausea is controlled with PO PHENERGAN. STILL WANDERS WHAT'S GOING ON WITH HER. DENIES NEW MEDS, STRESS, OR CHANGE IN DIET. DIARY INTAKE MINIMAL.   Objective: Vital signs in last 24 hours: Filed Vitals:   11/08/15 2150 11/09/15 0539  BP: 101/42 101/69  Pulse: 60 62  Temp: 98.8 F (37.1 C) 97.6 F (36.4 C)  Resp: 20 18   General appearance: alert, cooperative and no distress Resp: clear to auscultation bilaterally Cardio: regular rate and rhythm GI: soft, MILDLY Tender IN LLQ; bowel sounds normal; NO REBOUND OR GUARDING  Lab Results:   Hb 10.8, GI PATH PANEL-NEG, K 3.6 Cr 0.81  PATH PENDING  Studies/Results: No results found.  Medications: I have reviewed the patient's current medications.   LOS: 5 days   Jonette EvaSandi Danelia Snodgrass 11/30/2013, 2:23 PM

## 2015-11-09 NOTE — Progress Notes (Signed)
PROGRESS NOTE    [name]  JXB:[id number] DOB: 01/18/1971 DOA: 11/06/2015 PCP: Ernestine ConradBLUTH, KIRK, MD  Brief Narrative73:  45 yof with medical history of HTN, hypothyroidism presented with complaints of fecal incontinence with persistent stools and associated vomiting and abdominal cramping. In the ED noted to have mild hypokalemia and mild metabolic acidosis. CT scan revealed possible colitis. She has been referred for admission.   Assessment & Plan: Active Problems:   Anxiety disorder   Hypokalemia   Hypothyroidism, adult   Colitis   Vomiting and diarrhea   GERD (gastroesophageal reflux disease)  1. Colitis, Gi pathogen panel unremarkable. Differential diagnosis includes IBS-D, microscopic colitis, or SIBO. Initially started on ciprofloxacin and Flagyl. Abx discontinued due to negative GI pathogen panel. Gastroenterology has consulted and preformed sigmoidoscopy 5/18 which was largely unremarkable. Follow up biopsy results. GI also recommends lactose free diet. Will advance diet and monitor, if she is able to tolerate solid food will discharge home tomorrow.  2. Nausea, vomiting, diarrhea. Zofran changed to phenergan. Continue to treat supportively with antiemetics. Also started on imodium for diarrhea. Diarrhea and vomiting have improved.  3. GERD. Continue PPI. 4. Hypothyroidism. Continue Synthroid. 5. Hypokalemia. Likely related to GI losses. Resolved.   6. Anxiety disorder. Continue home dose of Valium.  DVT prophylaxis:Lovenox Code Status: Full Family Communication: No family bedside Disposition Plan: Discharge home within 24 hours.    Consultants:   Gastroenterology   Procedures:   Flexible sigmoidoscopy 5/18  Antimicrobials:   Flagyl 5/17>>5/20  Cipro 5/17>>5/20   Subjective: Doing fine. Has not had a bowel movement since this morning.   Objective: Filed Vitals:   11/08/15 0629 11/08/15 1500 11/08/15 2150 11/09/15 0539  BP: 93/54 98/62 101/42 101/69  Pulse:  54 62 60 62  Temp: 98 F (36.7 C) 98.3 F (36.8 C) 98.8 F (37.1 C) 97.6 F (36.4 C)  TempSrc: Oral Oral Oral Oral  Resp: 21 20 20 18   Height:      Weight:      SpO2: 99% 98% 97% 100%    Intake/Output Summary (Last 24 hours) at 11/09/15 0839 Last data filed at 11/08/15 1500  Gross per 24 hour  Intake    120 ml  Output      0 ml  Net    120 ml   Filed Weights   11/06/15 0808 11/06/15 1600  Weight: 63.504 kg (140 lb) 66.543 kg (146 lb 11.2 oz)    Examination:  General exam: Appears calm and comfortable  Respiratory system: Clear to auscultation. Respiratory effort normal. Cardiovascular system: S1 & S2 heard, RRR. No JVD, murmurs, rubs, gallops or clicks. No pedal edema. Gastrointestinal system: LLQ mild tenderness. nondistended, soft and nontender. No organomegaly or masses felt. Normal bowel sounds heard. Central nervous system: Alert and oriented. No focal neurological deficits. Extremities: Symmetric 5 x 5 power. Skin: No rashes, lesions or ulcers Psychiatry: Judgement and insight appear normal. Mood & affect appropriate.     Data Reviewed: I have personally reviewed following labs and imaging studies  CBC:  Recent Labs Lab 11/06/15 0900 11/06/15 2200 11/07/15 0559 11/08/15 0635  WBC 15.4* 7.2 6.6 6.9  NEUTROABS 12.6*  --   --   --   HGB 12.8 11.5* 10.8* 10.8*  HCT 37.3 33.6* 32.6* 31.7*  MCV 92.3 94.4 96.2 94.3  PLT 276 239 222 207   Basic Metabolic Panel:  Recent Labs Lab 11/06/15 0900 11/06/15 2200 11/07/15 0559 11/08/15 0635  NA 139  --  140  139  K 3.2*  --  3.7 3.6  CL 111  --  113* 110  CO2 19*  --  22 23  GLUCOSE 117*  --  93 94  BUN 20  --  11 6  CREATININE 0.90 0.84 0.82 0.81  CALCIUM 9.2  --  8.4* 8.3*  MG  --   --  1.9  --      Recent Labs Lab 11/06/15 0900  AST 27  ALT 40  ALKPHOS 69  BILITOT 0.6  PROT 7.2  ALBUMIN 4.4    Recent Labs Lab 11/06/15 0900  LIPASE 54*      Component Value Date/Time   COLORURINE  YELLOW 11/06/2015 1108   APPEARANCEUR CLEAR 11/06/2015 1108   LABSPEC 1.020 11/06/2015 1108   PHURINE 5.5 11/06/2015 1108   GLUCOSEU NEGATIVE 11/06/2015 1108   HGBUR NEGATIVE 11/06/2015 1108   BILIRUBINUR NEGATIVE 11/06/2015 1108   KETONESUR NEGATIVE 11/06/2015 1108   PROTEINUR NEGATIVE 11/06/2015 1108   UROBILINOGEN 0.2 02/28/2015 2315   NITRITE NEGATIVE 11/06/2015 1108   LEUKOCYTESUR NEGATIVE 11/06/2015 1108      Radiology Studies: No results found.      Scheduled Meds: . ciprofloxacin  400 mg Intravenous Q12H  . dicyclomine  10 mg Oral TID AC  . enoxaparin (LOVENOX) injection  40 mg Subcutaneous Q24H  . FLUoxetine  40 mg Oral Daily  . levothyroxine  112 mcg Oral QAC breakfast  . loperamide  2 mg Oral TID  . metronidazole  500 mg Intravenous Q8H  . pantoprazole  40 mg Oral BID  . vitamin B-12  100 mcg Oral Daily   Continuous Infusions: . 0.9 % NaCl with KCl 40 mEq / L 10 mL/hr (11/08/15 1651)     LOS: 3 days    Time spent:     Fuquan Wilson, MD  [facility] Pager [phone]  If 7PM-7AM, please contact night-coverage [email or web address] Password [email or web address] 11/09/2015, 8:39 AM     By signing my name below, I, Zadie Cleverly, attest that this documentation has been prepared under the direction and in the presence of Erick Blinks, MD. Electronically signed: Zadie Cleverly, Scribe. 11/09/2015 2:08pm   I, Dr. Erick Blinks, personally performed the services described in this documentaiton. All medical record entries made by the scribe were at my direction and in my presence. I have reviewed the chart and agree that the record reflects my personal performance and is accurate and complete  Erick Blinks, MD, 11/09/2015 2:19 PM

## 2015-11-10 DIAGNOSIS — E876 Hypokalemia: Secondary | ICD-10-CM | POA: Diagnosis not present

## 2015-11-10 DIAGNOSIS — F419 Anxiety disorder, unspecified: Secondary | ICD-10-CM | POA: Diagnosis not present

## 2015-11-10 DIAGNOSIS — K219 Gastro-esophageal reflux disease without esophagitis: Secondary | ICD-10-CM | POA: Diagnosis not present

## 2015-11-10 DIAGNOSIS — K529 Noninfective gastroenteritis and colitis, unspecified: Secondary | ICD-10-CM | POA: Diagnosis not present

## 2015-11-10 LAB — BASIC METABOLIC PANEL
ANION GAP: 5 (ref 5–15)
BUN: 9 mg/dL (ref 6–20)
CHLORIDE: 107 mmol/L (ref 101–111)
CO2: 27 mmol/L (ref 22–32)
CREATININE: 1 mg/dL (ref 0.44–1.00)
Calcium: 9 mg/dL (ref 8.9–10.3)
GFR calc non Af Amer: >60 mL/min (ref >60)
Glucose, Bld: 89 mg/dL (ref 65–99)
POTASSIUM: 3.9 mmol/L (ref 3.5–5.1)
SODIUM: 139 mmol/L (ref 135–145)

## 2015-11-10 MED ORDER — RISAQUAD PO CAPS: 2.0000 | ORAL_CAPSULE | Freq: Every day | ORAL | Status: DC

## 2015-11-10 MED ORDER — DICYCLOMINE HCL 10 MG PO CAPS: 10.0000 mg | ORAL_CAPSULE | Freq: Two times a day (BID) | ORAL | Status: DC

## 2015-11-10 MED ORDER — OXYCODONE-ACETAMINOPHEN 5-325 MG PO TABS: 1.0000 | ORAL_TABLET | ORAL | Status: DC | PRN

## 2015-11-10 MED ORDER — PROMETHAZINE HCL 25 MG PO TABS: 25.0000 mg | ORAL_TABLET | Freq: Three times a day (TID) | ORAL | Status: DC | PRN

## 2015-11-10 NOTE — Discharge Summary (Signed)
Physician Discharge Summary  [name] GNF:[id number] DOB: 1970-11-23 DOA: 11/06/2015  PCP: [name], MD  Admit date: 11/06/2015 Discharge date: 11/10/2015  Time spent: 35 minutes  Recommendations for Outpatient Follow-up:  1. Follow up with PCP in 1-2 weeks.  2. Follow up with outpatient GI, Dr. [name] for recommended hydrogen breath test to elevate for SIBO. No abx for two weeks prior to exam.    Discharge Diagnoses:  Active Problems:   Anxiety disorder   Hypokalemia   Hypothyroidism, adult   Colitis   Vomiting and diarrhea   GERD (gastroesophageal reflux disease)   Discharge Condition: Improved.   Diet recommendation: Lactose-free  Filed Weights   11/06/15 0808 11/06/15 1600  Weight: 63.504 kg (140 lb) 66.543 kg (146 lb 11.2 oz)    History of present illness:  41 yof with medical history of HTN, hypothyroidism presented with complaints of fecal incontinence with persistent stools and associated vomiting and abdominal cramping. In the ED noted to have mild hypokalemia and mild metabolic acidosis. CT scan revealed possible colitis. She has been referred for admission.   Hospital Course:  Patient was admitted due to complaints of fecal incontinence with persistent stools and associated vomiting and abdominal cramping. Symptoms found to be due Colitis with differential diagnosis including IBS-D, microscopic colitis, or SIBO. GI followed and performed flexible sigmoidoscopy 5/18 which was largely unremarkable. Biopsy were done and are currently in process. Follow up biopsy results. GI pathogens were negative. Pt was empirically treated with ciro and flagyl which were discontinued when stools studies came back negative. Pt was started on lactose free diet. She was treated supportively with antiemetics and imodium. Vomiting and diarrhea have resolved and she is tolerating a soild diet.  1. Nausea, vomiting, diarrhea. Resolved, with phenergan and supportive treatment. Also  started on imodium for diarrhea.   2. GERD. Continue PPI. 3. Hypothyroidism. Continue Synthroid. 4. Hypokalemia. Likely related to GI losses. Resolved.  5. Anxiety disorder. Continue home dose of Valium.  Consultants:   Gastroenterology  Procedures:   Flexible sigmoidoscopy 5/18  Discharge Exam: Filed Vitals:   11/10/15 0619 11/10/15 1333  BP: 115/50 109/74  Pulse: 58 62  Temp: 98 F (36.7 C) 98.1 F (36.7 C)  Resp: 18 18    General: NAD, looks comfortable Cardiovascular: RRR, S1, S2  Respiratory: clear bilaterally, No wheezing, rales or rhonchi Abdomen: soft, non tender, no distention , bowel sounds normal Musculoskeletal: No edema b/l  Discharge Instructions   Discharge Instructions    Diet - low sodium heart healthy    Complete by:  As directed      Increase activity slowly    Complete by:  As directed           Current Discharge Medication List    START taking these medications   Details  acidophilus (RISAQUAD) CAPS capsule Take 2 capsules by mouth daily. Qty: 60 capsule, Refills: 0    dicyclomine (BENTYL) 10 MG capsule Take 1 capsule (10 mg total) by mouth 2 (two) times daily with a meal. Qty: 60 capsule, Refills: 0    oxyCODONE-acetaminophen (PERCOCET/ROXICET) 5-325 MG tablet Take 1-2 tablets by mouth every 4 (four) hours as needed for severe pain. Qty: 30 tablet, Refills: 0    promethazine (PHENERGAN) 25 MG tablet Take 1 tablet (25 mg total) by mouth every 8 (eight) hours as needed for nausea or vomiting. Qty: 30 tablet, Refills: 0      CONTINUE these medications which have NOT CHANGED   Details  diazepam (VALIUM) 10 MG tablet Take 10 mg by mouth every 6 (six) hours as needed for anxiety.  Refills: 4    FLUoxetine (PROZAC) 40 MG capsule TK 1 C PO QD Refills: 11    levothyroxine (SYNTHROID, LEVOTHROID) 112 MCG tablet Take 112 mcg by mouth daily. Refills: 10    pantoprazole (PROTONIX) 40 MG tablet Take 1 tablet (40 mg total) by mouth 2  (two) times daily. Qty: 60 tablet, Refills: 1    vitamin B-12 (CYANOCOBALAMIN) 100 MCG tablet Take 100 mcg by mouth daily.       Allergies  Allergen Reactions  . Morphine And Related Itching and Rash      The results of significant diagnostics from this hospitalization (including imaging, microbiology, ancillary and laboratory) are listed below for reference.    Significant Diagnostic Studies: Ct Abdomen Pelvis W Contrast  11/06/2015  CLINICAL DATA:  Left-sided abdominal pain EXAM: CT ABDOMEN AND PELVIS WITH CONTRAST TECHNIQUE: Multidetector CT imaging of the abdomen and pelvis was performed using the standard protocol following bolus administration of intravenous contrast. CONTRAST:  ISOVUE-300 IOPAMIDOL (ISOVUE-300) INJECTION 61% COMPARISON:  10/18/2015 FINDINGS: Lower chest:  Lung bases are clear.  Normal heart size. Hepatobiliary: Normal liver.  Prior cholecystectomy. Pancreas: Normal. Spleen: Normal. Adrenals/Urinary Tract: Normal adrenal glands. Normal kidneys. No urolithiasis or obstructive uropathy. Normal bladder. Stomach/Bowel: No bowel dilatation to suggest obstruction. Relative bowel wall thickening involving the descending and sigmoid colon concerning for mild colitis. Multiple air-fluid levels in the transverse colon. No pneumatosis, pneumoperitoneum or portal venous gas. Vascular/Lymphatic: Normal caliber abdominal aorta. No lymphadenopathy. Reproductive: Prior hysterectomy.  No adnexal mass. Other: No fluid collection or hematoma. Musculoskeletal: No acute osseous abnormality. No lytic or sclerotic osseous lesion. Small central disc protrusion at L5-S1. Bilateral facet arthropathy at L5-S1. IMPRESSION: 1. Relative bowel wall thickening involving the descending and sigmoid colon concerning for mild colitis which may be secondary to an infectious or inflammatory etiology. Electronically Signed   By: Elige Ko   On: 11/06/2015 13:32   Ct Abdomen Pelvis W  Contrast  10/18/2015  CLINICAL DATA:  Abdominal pain, nausea, vomiting and diarrhea since this morning. History of IBS, ulcerative colitis, hysterectomy, appendectomy, and cholecystectomy. EXAM: CT ABDOMEN AND PELVIS WITH CONTRAST TECHNIQUE: Multidetector CT imaging of the abdomen and pelvis was performed using the standard protocol following bolus administration of intravenous contrast. CONTRAST:  ISOVUE-300 IOPAMIDOL (ISOVUE-300) INJECTION 61% COMPARISON:  CT abdomen dated 03/01/2015. FINDINGS: Lower chest:  Minimal dependent atelectasis at each lung base. Hepatobiliary: Status post cholecystectomy. Probable small cyst within the right liver lobe, too small to definitively characterize, stable compared to earlier CT of 03/01/2015. Pancreas: No mass, inflammatory changes, or other significant abnormality. Spleen: Within normal limits in size and appearance. Adrenals/Urinary Tract: No masses identified. No evidence of hydronephrosis. Stomach/Bowel: At least mild thickening of the walls of the transverse colon, descending colon and sigmoid colon. Walls of the right colon appear relatively normal. Fluid is seen throughout the majority of the nondistended colon. No large bowel or small bowel dilatation. Vascular/Lymphatic: Scattered small lymph nodes within the abdomen and pelvis. No enlarged lymph nodes. Mild atherosclerotic changes along the walls of the normal- caliber abdominal aorta. IVC is decompressed suggesting hypovolemia. Reproductive: No mass or other significant abnormality. Other: Trace free fluid in the pelvis. No abscess collection seen. No free intraperitoneal air. No evidence of pneumatosis intestinalis. Musculoskeletal: Mild degenerative change in the lower lumbar spine, with associated mild disc bulges at the L4-5 and  L5-S1 levels. No acute or suspicious osseous lesion. IMPRESSION: 1. At least mild thickening of the walls of the transverse colon, descending colon and sigmoid colon. This is  almost certainly related to patient's history of ulcerative colitis, possibly acute on chronic bowel wall thickening. Fluid is present throughout the majority of the colon suggesting at least some degree of acute bowel wall inflammation. No associated bowel obstruction. 2. Small amount of free fluid in the pelvis is likely related to the adjacent bowel wall thickening/inflammation. No abscess collection. No free intraperitoneal air. 3. IVC is decompressed suggesting some degree of hypovolemia. 4. Status post cholecystectomy. 5. Mild degenerative change in the lumbar spine, as described above. Electronically Signed   By: Bary Richard M.D.   On: 10/18/2015 16:24    Microbiology: Recent Results (from the past 240 hour(s))  Gastrointestinal Panel by PCR , Stool     Status: None   Collection Time: 11/06/15  7:45 PM  Result Value Ref Range Status   Campylobacter species NOT DETECTED NOT DETECTED Final   Plesimonas shigelloides NOT DETECTED NOT DETECTED Final   Salmonella species NOT DETECTED NOT DETECTED Final   Yersinia enterocolitica NOT DETECTED NOT DETECTED Final   Vibrio species NOT DETECTED NOT DETECTED Final   Vibrio cholerae NOT DETECTED NOT DETECTED Final   Enteroaggregative E coli (EAEC) NOT DETECTED NOT DETECTED Final   Enteropathogenic E coli (EPEC) NOT DETECTED NOT DETECTED Final   Enterotoxigenic E coli (ETEC) NOT DETECTED NOT DETECTED Final   Shiga like toxin producing E coli (STEC) NOT DETECTED NOT DETECTED Final   E. coli O157 NOT DETECTED NOT DETECTED Final   Shigella/Enteroinvasive E coli (EIEC) NOT DETECTED NOT DETECTED Final   Cryptosporidium NOT DETECTED NOT DETECTED Final   Cyclospora cayetanensis NOT DETECTED NOT DETECTED Final   Entamoeba histolytica NOT DETECTED NOT DETECTED Final   Giardia lamblia NOT DETECTED NOT DETECTED Final   Adenovirus F40/41 NOT DETECTED NOT DETECTED Final   Astrovirus NOT DETECTED NOT DETECTED Final   Norovirus GI/GII NOT DETECTED NOT DETECTED  Final   Rotavirus A NOT DETECTED NOT DETECTED Final   Sapovirus (I, II, IV, and V) NOT DETECTED NOT DETECTED Final     Labs: Basic Metabolic Panel:  Recent Labs Lab 11/06/15 0900 11/06/15 2200 11/07/15 0559 11/08/15 0635 11/10/15 0607  NA 139  --  140 139 139  K 3.2*  --  3.7 3.6 3.9  CL 111  --  113* 110 107  CO2 19*  --  22 23 27   GLUCOSE 117*  --  93 94 89  BUN 20  --  11 6 9   CREATININE 0.90 0.84 0.82 0.81 1.00  CALCIUM 9.2  --  8.4* 8.3* 9.0  MG  --   --  1.9  --   --    Liver Function Tests:  Recent Labs Lab 11/06/15 0900  AST 27  ALT 40  ALKPHOS 69  BILITOT 0.6  PROT 7.2  ALBUMIN 4.4    Recent Labs Lab 11/06/15 0900  LIPASE 54*   No results for input(s): AMMONIA in the last 168 hours. CBC:  Recent Labs Lab 11/06/15 0900 11/06/15 2200 11/07/15 0559 11/08/15 0635  WBC 15.4* 7.2 6.6 6.9  NEUTROABS 12.6*  --   --   --   HGB 12.8 11.5* 10.8* 10.8*  HCT 37.3 33.6* 32.6* 31.7*  MCV 92.3 94.4 96.2 94.3  PLT 276 239 222 207    Signed:  .Erick Blinks, MD [facility] 11/10/2015,  2:59 PM  By signing my name below, I, Zadie CleverlyJessica Augustus, attest that this documentation has been prepared under the direction and in the presence of Erick BlinksJehanzeb Memon, MD. Electronically signed: Zadie CleverlyJessica Augustus, Scribe. 11/10/2015 11:38am  I, Dr. Erick BlinksJehanzeb Memon, personally performed the services described in this documentaiton. All medical record entries made by the scribe were at my direction and in my presence. I have reviewed the chart and agree that the record reflects my personal performance and is accurate and complete  Erick BlinksJehanzeb Memon, MD, 11/10/2015 2:59 PM

## 2015-11-10 NOTE — Progress Notes (Signed)
Patient ID: [name], female   DOB: 12/15/1970, 45 y.o.   MRN: [id number]  Assessment/Plan: ADMITTED WITH NVD. HAD NO BMs SINCE YESTERDAY. TOLERATING DIET.  PLAN: 1. CONSIDER HYDROGEN BREATH TEST TO EVALUATE FOR SIBO. NEEDS TO BE OFF ABX FOR 2 WEEKS PRIOR TO EXAM. 2. CONTINUE LACTOSE FREE DIET & PHENERGAN. REDUCE BENTYL TO BID. 3. OPV IN 4-6 WEEKS WITH DR. Karilyn CotaEHMAN.  Subjective: Since I last evaluated the patient SHE HAS HAD NO BM WHILE TAKING . HER NAUSEA IS CONTROLLED WITH PHENERGAN. STILL HAD MILD LLQ ABDOMINAL PAIN.  Objective: Vital signs in last 24 hours: Filed Vitals:   11/09/15 2152 11/10/15 0619  BP: 115/71 115/50  Pulse: 56 58  Temp: 98.3 F (36.8 C) 98 F (36.7 C)  Resp: 18 18   General appearance: alert, cooperative and no distress Resp: clear to auscultation bilaterally Cardio: regular rate and rhythm GI: soft, MILDLY tender IN LQ; bowel sounds normal; NO REBOUND OR GUARDING  Lab Results:  K 3.9 Cr 1.00   Studies/Results: No results found.  Medications: I have reviewed the patient's current medications.   LOS: 5 days   Jonette EvaSandi Fields 11/30/2013, 2:23 PM

## 2015-11-10 NOTE — Progress Notes (Signed)
Pt. discharged home. IV discontinued, pt. Tolerated well.

## 2015-11-11 ENCOUNTER — Encounter (HOSPITAL_COMMUNITY): Payer: Self-pay | Admitting: Internal Medicine

## 2015-11-21 NOTE — Progress Notes (Signed)
Patient was given an appointment for 12/03/15 at 1:30pm

## 2015-12-03 ENCOUNTER — Encounter (INDEPENDENT_AMBULATORY_CARE_PROVIDER_SITE_OTHER): Payer: Self-pay | Admitting: Internal Medicine

## 2015-12-03 ENCOUNTER — Ambulatory Visit (INDEPENDENT_AMBULATORY_CARE_PROVIDER_SITE_OTHER): Payer: BLUE CROSS/BLUE SHIELD | Admitting: Internal Medicine

## 2015-12-03 VITALS — BP 110/68 | HR 70 | Temp 97.7°F | Resp 18 | Ht 65.0 in | Wt 145.2 lb

## 2015-12-03 DIAGNOSIS — Z8719 Personal history of other diseases of the digestive system: Secondary | ICD-10-CM

## 2015-12-03 DIAGNOSIS — K589 Irritable bowel syndrome without diarrhea: Secondary | ICD-10-CM

## 2015-12-03 DIAGNOSIS — R110 Nausea: Secondary | ICD-10-CM

## 2015-12-03 MED ORDER — DICYCLOMINE HCL 10 MG PO CAPS: 10.0000 mg | ORAL_CAPSULE | Freq: Two times a day (BID) | ORAL | Status: DC | PRN

## 2015-12-03 MED ORDER — BISACODYL 10 MG RE SUPP: 10.0000 mg | RECTAL | Status: DC | PRN

## 2015-12-03 MED ORDER — LINACLOTIDE 72 MCG PO CAPS: 72.0000 ug | ORAL_CAPSULE | Freq: Every day | ORAL | Status: DC

## 2015-12-03 MED ORDER — PROMETHAZINE HCL 25 MG PO TABS: 25.0000 mg | ORAL_TABLET | Freq: Two times a day (BID) | ORAL | Status: DC | PRN

## 2015-12-03 NOTE — Progress Notes (Signed)
Presenting complaint;  Follow-up to recent hospitalization for nausea vomiting diarrhea abdominal pain. Patient complains of nausea.  Subjective:  Patient is 45 year old Caucasian female who is here for scheduled visit accompanied by her husband. She was hospitalized twice last month. First she was admitted with nausea vomiting diarrhea and epigastric pain. Her diarrhea improved with epigastric pain and nausea vomiting did not. She had EGD and noted to have gastritis. She was empirically treated. She felt that was discharged but returned with copious diarrhea. CT suggested sigmoid colitis. Flexible sigmoidoscopy was unremarkable and so was biopsied. She states when she was discharged prescriptions were not signed and she was not able to fill pain medication as well as Phenergan. She feels better. She states she has not had any more "crazy episodes". She is having mild episodic pain in left lower quadrant. She is back to being constipated. She stopped been tell for this reason. While she has had nausea but she has not had any vomiting. She denies fever chills melena or rectal bleeding. She lost about 10 pounds with her sickness but she has gained 5 back. She says her mother is having convulsions and she is being transferred from [facility] to [facility]. She has been under a lot of stress and not eating right.    Current Medications: Outpatient Encounter Prescriptions as of 12/03/2015  Medication Sig  . diazepam (VALIUM) 10 MG tablet Take 10 mg by mouth every 6 (six) hours as needed for anxiety.   Marland Kitchen FLUoxetine (PROZAC) 40 MG capsule TK 1 C PO QD  . levothyroxine (SYNTHROID, LEVOTHROID) 112 MCG tablet Take 112 mcg by mouth daily.  . pantoprazole (PROTONIX) 40 MG tablet Take 1 tablet (40 mg total) by mouth 2 (two) times daily.  . vitamin B-12 (CYANOCOBALAMIN) 100 MCG tablet Take 100 mcg by mouth daily.  Marland Kitchen acidophilus (RISAQUAD) CAPS capsule Take 2 capsules by mouth daily. (Patient not taking: Reported on  12/03/2015)  . dicyclomine (BENTYL) 10 MG capsule Take 1 capsule (10 mg total) by mouth 2 (two) times daily with a meal. (Patient not taking: Reported on 12/03/2015)  . oxyCODONE-acetaminophen (PERCOCET/ROXICET) 5-325 MG tablet Take 1-2 tablets by mouth every 4 (four) hours as needed for severe pain. (Patient not taking: Reported on 12/03/2015)  . promethazine (PHENERGAN) 25 MG tablet Take 1 tablet (25 mg total) by mouth every 8 (eight) hours as needed for nausea or vomiting. (Patient not taking: Reported on 12/03/2015)   No facility-administered encounter medications on file as of 12/03/2015.     Objective: Blood pressure 110/68, pulse 70, temperature 97.7 F (36.5 C), temperature source Oral, resp. rate 18, height  (1.651 m), weight 145 lb 3.2 oz (65.862 kg). Patient is alert and in no acute distress. Conjunctiva is pink. Sclera is nonicteric Oropharyngeal mucosa is normal. No neck masses or thyromegaly noted. Cardiac exam with regular rhythm normal S1 and S2. No murmur or gallop noted. Lungs are clear to auscultation. Abdomen is symmetrical. Bowel sounds are normal. On palpation abdomen is soft with mild tenderness at LLQ. No organomegaly or masses. No LE edema or clubbing noted.   Assessment:  #1. Recent illness appears to be secondary to gastroenteritis or enterocolitis. She has almost fully recovered but still having nausea. #2. IBS-C. she had copious diarrhea during recent hospitalization and GI pathogen panel 2 as well as sigmoidoscopy and sigmoid colon biopsies are negative. She is back to her baseline. She needs to increase fiber intake and she needs to take medications and schedule  rather than when necessary so that she has at least 2 bowel movements a week.   Plan:  Patient advised to use dicyclomine on as-needed basis. High fiber diet. Linzess 72 mcg by mouth every morning. Samples as well as new prescription given. Promethazine 25 mg by mouth twice a day when  necessary. Use Dulcolax suppository every third day on as-needed basis. Stool diary until office visit in 3 months.

## 2015-12-03 NOTE — Patient Instructions (Signed)
High fiber diet. Use Dulcolax suppository every third day unless you've had spontaneous bowel movement. Stool diary as to frequency and consistency of stools and times when use Dulcolax until next visit.

## 2016-01-21 ENCOUNTER — Telehealth (INDEPENDENT_AMBULATORY_CARE_PROVIDER_SITE_OTHER): Payer: Self-pay | Admitting: Internal Medicine

## 2016-01-21 NOTE — Telephone Encounter (Signed)
Patient called and stated that the Linzess 72 didn't work.  She said Dr. [name] called some to [facility], but [facility] says [facility] don't [facility] it.  She would like Linzess other than 72 called in.  [phone]

## 2016-01-23 NOTE — Telephone Encounter (Signed)
TErri - Would you advise what mg of Linzess needs to be called in ,sine the 72 has not worked?

## 2016-01-23 NOTE — Telephone Encounter (Signed)
No answer at home #  Will try again later

## 2016-02-05 NOTE — Telephone Encounter (Signed)
This problem has been resolved. [facility] has found the Rx for Linzess

## 2016-03-03 ENCOUNTER — Encounter (INDEPENDENT_AMBULATORY_CARE_PROVIDER_SITE_OTHER): Payer: Self-pay | Admitting: Internal Medicine

## 2016-03-03 ENCOUNTER — Ambulatory Visit (INDEPENDENT_AMBULATORY_CARE_PROVIDER_SITE_OTHER): Payer: BLUE CROSS/BLUE SHIELD | Admitting: Internal Medicine

## 2016-03-23 DIAGNOSIS — E784 Other hyperlipidemia: Secondary | ICD-10-CM | POA: Diagnosis not present

## 2016-03-23 DIAGNOSIS — J209 Acute bronchitis, unspecified: Secondary | ICD-10-CM | POA: Diagnosis not present

## 2016-03-23 DIAGNOSIS — K219 Gastro-esophageal reflux disease without esophagitis: Secondary | ICD-10-CM | POA: Diagnosis not present

## 2016-03-23 DIAGNOSIS — F17200 Nicotine dependence, unspecified, uncomplicated: Secondary | ICD-10-CM | POA: Diagnosis not present

## 2016-03-23 DIAGNOSIS — Z23 Encounter for immunization: Secondary | ICD-10-CM | POA: Diagnosis not present

## 2016-05-25 ENCOUNTER — Emergency Department (HOSPITAL_COMMUNITY)
Admission: EM | Admit: 2016-05-25 | Discharge: 2016-05-25 | Disposition: A | Discharge status: Home / Self Care | Payer: BLUE CROSS/BLUE SHIELD | Attending: Emergency Medicine | Admitting: Emergency Medicine

## 2016-05-25 ENCOUNTER — Encounter (HOSPITAL_COMMUNITY): Payer: Self-pay | Admitting: Emergency Medicine

## 2016-05-25 DIAGNOSIS — Y929 Unspecified place or not applicable: Secondary | ICD-10-CM | POA: Diagnosis not present

## 2016-05-25 DIAGNOSIS — Y999 Unspecified external cause status: Secondary | ICD-10-CM | POA: Diagnosis not present

## 2016-05-25 DIAGNOSIS — Z79899 Other long term (current) drug therapy: Secondary | ICD-10-CM | POA: Diagnosis not present

## 2016-05-25 DIAGNOSIS — S61231A Puncture wound without foreign body of left index finger without damage to nail, initial encounter: Secondary | ICD-10-CM | POA: Diagnosis not present

## 2016-05-25 DIAGNOSIS — F17210 Nicotine dependence, cigarettes, uncomplicated: Secondary | ICD-10-CM | POA: Insufficient documentation

## 2016-05-25 DIAGNOSIS — Z23 Encounter for immunization: Secondary | ICD-10-CM | POA: Insufficient documentation

## 2016-05-25 DIAGNOSIS — W461XXA Contact with contaminated hypodermic needle, initial encounter: Secondary | ICD-10-CM | POA: Diagnosis not present

## 2016-05-25 DIAGNOSIS — IMO0001 Reserved for inherently not codable concepts without codable children: Secondary | ICD-10-CM

## 2016-05-25 DIAGNOSIS — S6992XA Unspecified injury of left wrist, hand and finger(s), initial encounter: Secondary | ICD-10-CM | POA: Diagnosis present

## 2016-05-25 DIAGNOSIS — E039 Hypothyroidism, unspecified: Secondary | ICD-10-CM | POA: Diagnosis not present

## 2016-05-25 DIAGNOSIS — Y9389 Activity, other specified: Secondary | ICD-10-CM | POA: Insufficient documentation

## 2016-05-25 DIAGNOSIS — W273XXA Contact with needle (sewing), initial encounter: Secondary | ICD-10-CM

## 2016-05-25 DIAGNOSIS — S61239A Puncture wound without foreign body of unspecified finger without damage to nail, initial encounter: Secondary | ICD-10-CM

## 2016-05-25 MED ORDER — TETANUS-DIPHTH-ACELL PERTUSSIS 5-2.5-18.5 LF-MCG/0.5 IM SUSP
0.5000 mL | Freq: Once | INTRAMUSCULAR | Status: AC
  Administered 2016-05-25: 0.5 mL via INTRAMUSCULAR
  Filled 2016-05-25: qty 0.5

## 2016-05-25 MED ORDER — DOXYCYCLINE HYCLATE 100 MG PO TABS
100.0000 mg | ORAL_TABLET | Freq: Once | ORAL | Status: AC
  Administered 2016-05-25: 100 mg via ORAL
  Filled 2016-05-25: qty 1

## 2016-05-25 MED ORDER — DOXYCYCLINE HYCLATE 100 MG PO CAPS: 100.0000 mg | ORAL_CAPSULE | Freq: Two times a day (BID) | ORAL | 0 refills | Status: DC

## 2016-05-25 NOTE — ED Provider Notes (Signed)
[facility] Provider Note   CSN: [id number] Arrival date & time: 05/25/16  1818  By signing my name below, I, Placido SouLogan Joldersma, attest that this documentation has been prepared under the direction and in the presence of Ivery QualeHobson Payal Stanforth, PA-C. Electronically Signed: Placido SouLogan Joldersma, ED Scribe. 05/25/16. 7:42 PM.   History   Chief Complaint Chief Complaint  Patient presents with  . Body Fluid Exposure    HPI HPI Comments: [name] is a 45 y.o. female who presents to the Emergency Department due to exposure of a dirty syringe which occurred earlier today. She states that she found a syringe with a dirty, bent needle and when she grabbed it she was stuck in the left index finger. She denies any bleeding occurred at the time. Pt states it belongs to her daughter who has a known h/o IVDA and is currently incarcerated. Pt is sure that the needle was used by both her daughter as well as her friends. Her tetanus vaccination is not UTD. She has a h/o IDDM. Pt denies a h/o HIV or hepatitis. She has no h/o organ transplants. No other associated symptoms at this time.    The history is provided by the patient and medical records. No language interpreter was used.  Body Fluid Exposure  Type of exposure:  Needle stick Exposure substance: unknown   Exposure location:  Hand Hand exposure location:  L fingers Context:  Family member Tetanus immunization status:  Out of date Patient immunocompromised: no   Known source: yes   Source HIV status:  Unknown Source hepatitis B status:  Unknown Source hepatitis C status:  Unknown   Past Medical History:  Diagnosis Date  . Anxiety   . Diverticula of intestine   . IBS (irritable bowel syndrome)   . Migraines   . Thyroid disease   . Ulcerative colitis   . Vertigo     Patient Active Problem List   Diagnosis Date Noted  . Colitis 11/06/2015  . Vomiting and diarrhea 11/06/2015  . GERD (gastroesophageal reflux disease) 11/06/2015  .  Thrombocytopenia (HCC) 10/21/2015  . Hypokalemia 10/19/2015  . Hypothyroidism, adult 10/19/2015  . Hypovolemia 10/19/2015  . Enterocolitis 10/18/2015  . Headache in front of head 01/17/2013  . Hyperlipidemia 01/17/2013  . Chest pain 01/16/2013  . Tobacco abuse 01/16/2013  . Cellulitis of buttock, left 03/14/2012  . Fatty liver 09/01/2011  . Anxiety disorder 09/01/2011  . Abdominal pain 09/01/2011  . Pancreatitis 08/30/2011  . Leukocytosis 08/30/2011    Past Surgical History:  Procedure Laterality Date  . ABDOMINAL HYSTERECTOMY    . APPENDECTOMY    . CHOLECYSTECTOMY    . ESOPHAGOGASTRODUODENOSCOPY N/A 10/23/2015   Procedure: ESOPHAGOGASTRODUODENOSCOPY (EGD);  Surgeon: Malissa HippoNajeeb U Rehman, MD;  Location: [facility];  Service: Endoscopy;  Laterality: N/A;  . FLEXIBLE SIGMOIDOSCOPY N/A 11/07/2015   Procedure: FLEXIBLE SIGMOIDOSCOPY;  Surgeon: Malissa HippoNajeeb U Rehman, MD;  Location: [facility];  Service: Endoscopy;  Laterality: N/A;  . KNEE ARTHROSCOPY      OB History    No data available       Home Medications    Prior to Admission medications   Medication Sig Start Date End Date Taking? Authorizing Provider  bisacodyl (DULCOLAX) 10 MG suppository Place 1 suppository (10 mg total) rectally as needed for moderate constipation. 12/03/15   Malissa HippoNajeeb U Rehman, MD  diazepam (VALIUM) 10 MG tablet Take 10 mg by mouth every 6 (six) hours as needed for anxiety.  10/07/15   Historical Provider,  MD  dicyclomine (BENTYL) 10 MG capsule Take 1 capsule (10 mg total) by mouth 2 (two) times daily as needed for spasms. 12/03/15   Malissa Hippo, MD  FLUoxetine (PROZAC) 40 MG capsule TK 1 C PO QD 10/02/15   Historical Provider, MD  levothyroxine (SYNTHROID, LEVOTHROID) 112 MCG tablet Take 112 mcg by mouth daily. 04/17/15   Historical Provider, MD  linaclotide Karlene Einstein) 72 MCG capsule Take 1 capsule (72 mcg total) by mouth daily before breakfast. 12/03/15   Malissa Hippo, MD  pantoprazole (PROTONIX) 40 MG  tablet Take 1 tablet (40 mg total) by mouth 2 (two) times daily. 10/23/15   Erick Blinks, MD  promethazine (PHENERGAN) 25 MG tablet Take 1 tablet (25 mg total) by mouth 2 (two) times daily as needed for nausea or vomiting. 12/03/15   Malissa Hippo, MD  vitamin B-12 (CYANOCOBALAMIN) 100 MCG tablet Take 100 mcg by mouth daily.    Historical Provider, MD    Family History Family History  Problem Relation Age of Onset  . Diabetes Mother   . COPD Mother   . Heart disease Mother   . Breast cancer Paternal Grandmother   . Lung cancer Paternal Grandfather     Social History Social History  Substance Use Topics  . Smoking status: Current Every Day Smoker    Packs/day: 1.00    Years: 20.00    Types: Cigarettes  . Smokeless tobacco: Never Used  . Alcohol use No     Comment: socially     Allergies   Morphine and related   Review of Systems Review of Systems  Skin: Positive for wound. Negative for color change.  All other systems reviewed and are negative.  Physical Exam Updated Vital Signs BP 138/78 (BP Location: Left Arm)   Pulse 80   Temp 98.2 F (36.8 C) (Oral)   Resp 16   Ht 5\' 6"  (1.676 m)   Wt 147 lb (66.7 kg)   SpO2 98%   BMI 23.73 kg/m   Physical Exam  Constitutional: She is oriented to person, place, and time. She appears well-developed and well-nourished.  HENT:  Head: Normocephalic and atraumatic.  Eyes: EOM are normal.  Neck: Normal range of motion.  Cardiovascular: Normal rate, regular rhythm and normal heart sounds.  Exam reveals no gallop and no friction rub.   No murmur heard. Pulmonary/Chest: Effort normal and breath sounds normal. No respiratory distress. She has no wheezes. She has no rales.  Symmetrical rise and fall of the chest. LCTA.   Abdominal: Soft.  Musculoskeletal: Normal range of motion.  FROM of the left index finger. No palpable nodes of the left axilla.   Neurological: She is alert and oriented to person, place, and time.  Skin:  Skin is warm and dry.  Left hand: No red streaks. Capillary refill <2 seconds. Puncture wound to the lateral surface of the distal phalanx. No rash on the palm.   Psychiatric: She has a normal mood and affect.  Nursing note and vitals reviewed.  ED Treatments / Results  Labs (all labs ordered are listed, but only abnormal results are displayed) Labs Reviewed - No data to display  EKG  EKG Interpretation None       Radiology No results found.  Procedures Procedures  DIAGNOSTIC STUDIES: Oxygen Saturation is 98% on RA, normal by my interpretation.    COORDINATION OF CARE: 7:38 PM Discussed next steps with pt. Pt verbalized understanding and is agreeable with the plan.  Medications Ordered in ED Medications - No data to display   Initial Impression / Assessment and Plan / ED Course  I have reviewed the triage vital signs and the nursing notes.  Pertinent labs & imaging results that were available during my care of the patient were reviewed by me and considered in my medical decision making (see chart for details).  Clinical Course     **I have reviewed nursing notes, vital signs, and all appropriate lab and imaging results for this patient.*  **I personally performed the services described in this documentation, which was scribed in my presence. The recorded information has been reviewed and is accurate.* Final Clinical Impressions(s) / ED Diagnoses  Vital signs within normal limits. Tetanus status is updated. The needle that was involved had not been used for 4 or 5 days. The needle user is currently in jail, and testing can be carried out. Prescription for doxycycline 2 times daily given to the patient. Patient will cleanse the wound with soap and water daily. Patient will see the primary physician or return to the emergency department if any changes, problems, or concerns.    Final diagnoses:  Needlestick injury of finger, initial encounter    New  Prescriptions New Prescriptions   No medications on file     Ivery QualeHobson Yoan Sallade, PA-C 05/26/16 1844    Vanetta MuldersScott Zackowski, MD 05/27/16 [phone]

## 2016-05-25 NOTE — Discharge Instructions (Signed)
Please cleanse the wound with soap and water daily. Please use doxycycline 2 times daily with food until all taken. Please observe for signs of infection. [facility] will call you concerning your labs if any abnormality.

## 2016-05-25 NOTE — ED Triage Notes (Signed)
Patient states she was accidentally stuck by a needle that her daughter had lying around. States her daughter uses heroin and meth. States she called PCP and was told to go to ER for prophylactic medication. Patient states she was stuck in left first finger.

## 2016-05-27 LAB — HEPATITIS PANEL, ACUTE
HCV Ab: <0.1 s/co ratio (ref 0.0–0.9)
HEP A IGM: NEGATIVE
HEP B C IGM: NEGATIVE
Hepatitis B Surface Ag: NEGATIVE

## 2016-05-27 LAB — HIV ANTIBODY (ROUTINE TESTING W REFLEX): HIV Screen 4th Generation wRfx: NONREACTIVE

## 2016-07-20 DIAGNOSIS — F418 Other specified anxiety disorders: Secondary | ICD-10-CM | POA: Diagnosis not present

## 2016-07-20 DIAGNOSIS — M545 Low back pain: Secondary | ICD-10-CM | POA: Diagnosis not present

## 2016-07-20 DIAGNOSIS — K5792 Diverticulitis of intestine, part unspecified, without perforation or abscess without bleeding: Secondary | ICD-10-CM | POA: Diagnosis not present

## 2016-12-30 ENCOUNTER — Encounter (HOSPITAL_COMMUNITY): Payer: Self-pay | Admitting: Emergency Medicine

## 2016-12-30 ENCOUNTER — Emergency Department (HOSPITAL_COMMUNITY): Payer: BLUE CROSS/BLUE SHIELD

## 2016-12-30 ENCOUNTER — Emergency Department (HOSPITAL_COMMUNITY)
Admission: EM | Admit: 2016-12-30 | Discharge: 2016-12-30 | Disposition: A | Discharge status: Home / Self Care | Payer: BLUE CROSS/BLUE SHIELD | Attending: Emergency Medicine | Admitting: Emergency Medicine

## 2016-12-30 DIAGNOSIS — E039 Hypothyroidism, unspecified: Secondary | ICD-10-CM | POA: Diagnosis not present

## 2016-12-30 DIAGNOSIS — R1013 Epigastric pain: Secondary | ICD-10-CM

## 2016-12-30 DIAGNOSIS — R109 Unspecified abdominal pain: Secondary | ICD-10-CM | POA: Diagnosis not present

## 2016-12-30 DIAGNOSIS — Z79899 Other long term (current) drug therapy: Secondary | ICD-10-CM | POA: Diagnosis not present

## 2016-12-30 DIAGNOSIS — R079 Chest pain, unspecified: Secondary | ICD-10-CM | POA: Diagnosis not present

## 2016-12-30 DIAGNOSIS — F17210 Nicotine dependence, cigarettes, uncomplicated: Secondary | ICD-10-CM | POA: Insufficient documentation

## 2016-12-30 DIAGNOSIS — R0602 Shortness of breath: Secondary | ICD-10-CM | POA: Diagnosis not present

## 2016-12-30 LAB — CBC
HEMATOCRIT: 41.1 % (ref 36.0–46.0)
HEMOGLOBIN: 14.3 g/dL (ref 12.0–15.0)
MCH: 32.4 pg (ref 26.0–34.0)
MCHC: 34.8 g/dL (ref 30.0–36.0)
MCV: 93 fL (ref 78.0–100.0)
Platelets: 263 10*3/uL (ref 150–400)
RBC: 4.42 MIL/uL (ref 3.87–5.11)
RDW: 12.6 % (ref 11.5–15.5)
WBC: 12.6 10*3/uL — AB (ref 4.0–10.5)

## 2016-12-30 LAB — COMPREHENSIVE METABOLIC PANEL
ALT: 25 U/L (ref 14–54)
AST: 29 U/L (ref 15–41)
Albumin: 4.8 g/dL (ref 3.5–5.0)
Alkaline Phosphatase: 69 U/L (ref 38–126)
Anion gap: 11 (ref 5–15)
BUN: 12 mg/dL (ref 6–20)
CHLORIDE: 109 mmol/L (ref 101–111)
CO2: 23 mmol/L (ref 22–32)
CREATININE: 0.91 mg/dL (ref 0.44–1.00)
Calcium: 10.1 mg/dL (ref 8.9–10.3)
Glucose, Bld: 93 mg/dL (ref 65–99)
POTASSIUM: 3.7 mmol/L (ref 3.5–5.1)
SODIUM: 143 mmol/L (ref 135–145)
Total Bilirubin: 0.6 mg/dL (ref 0.3–1.2)
Total Protein: 7.7 g/dL (ref 6.5–8.1)

## 2016-12-30 LAB — LIPASE, BLOOD: LIPASE: 28 U/L (ref 11–51)

## 2016-12-30 LAB — I-STAT TROPONIN, ED: TROPONIN I, POC: 0 ng/mL (ref 0.00–0.08)

## 2016-12-30 MED ORDER — FENTANYL CITRATE (PF) 100 MCG/2ML IJ SOLN
50.0000 ug | Freq: Once | INTRAMUSCULAR | Status: AC
  Administered 2016-12-30: 50 ug via INTRAVENOUS
  Filled 2016-12-30: qty 2

## 2016-12-30 MED ORDER — ONDANSETRON HCL 4 MG/2ML IJ SOLN
4.0000 mg | Freq: Once | INTRAMUSCULAR | Status: AC
  Administered 2016-12-30: 4 mg via INTRAVENOUS
  Filled 2016-12-30: qty 2

## 2016-12-30 MED ORDER — LORAZEPAM 1 MG PO TABS: 1.0000 mg | ORAL_TABLET | Freq: Three times a day (TID) | ORAL | 0 refills | Status: DC | PRN

## 2016-12-30 MED ORDER — LORAZEPAM 2 MG/ML IJ SOLN
1.0000 mg | Freq: Once | INTRAMUSCULAR | Status: AC
  Administered 2016-12-30: 1 mg via INTRAVENOUS
  Filled 2016-12-30: qty 1

## 2016-12-30 MED ORDER — SODIUM CHLORIDE 0.9 % IV BOLUS (SEPSIS)
1000.0000 mL | Freq: Once | INTRAVENOUS | Status: AC
  Administered 2016-12-30: 1000 mL via INTRAVENOUS

## 2016-12-30 NOTE — Discharge Instructions (Signed)
Tests showed no life-threatening condition. Continue your home medicine. Prescription for anxiety medicine.

## 2016-12-30 NOTE — ED Provider Notes (Signed)
[facility] Provider Note   CSN: [id number] Arrival date & time: 12/30/16  2020     History   Chief Complaint Chief Complaint  Patient presents with  . Chest Pain    HPI [name] is a 46 y.o. female.  Left-sided chest pain with radiation to the shoulder earlier today with associated epigastric pain. ROS positive for nausea and headache, but no vomiting, diarrhea, dyspnea, diaphoresis. Past medical history includes colitis, diverticulitis, IBS, anxiety. Patient is worried about her daughter who has a problem with drug addiction.  Severity symptoms is moderate. Nothing makes symptoms better or worse.      Past Medical History:  Diagnosis Date  . Anxiety   . Diverticula of intestine   . IBS (irritable bowel syndrome)   . Migraines   . Thyroid disease   . Ulcerative colitis   . Vertigo     Patient Active Problem List   Diagnosis Date Noted  . Colitis 11/06/2015  . Vomiting and diarrhea 11/06/2015  . GERD (gastroesophageal reflux disease) 11/06/2015  . Thrombocytopenia (HCC) 10/21/2015  . Hypokalemia 10/19/2015  . Hypothyroidism, adult 10/19/2015  . Hypovolemia 10/19/2015  . Enterocolitis 10/18/2015  . Headache in front of head 01/17/2013  . Hyperlipidemia 01/17/2013  . Chest pain 01/16/2013  . Tobacco abuse 01/16/2013  . Cellulitis of buttock, left 03/14/2012  . Fatty liver 09/01/2011  . Anxiety disorder 09/01/2011  . Abdominal pain 09/01/2011  . Pancreatitis 08/30/2011  . Leukocytosis 08/30/2011    Past Surgical History:  Procedure Laterality Date  . ABDOMINAL HYSTERECTOMY    . APPENDECTOMY    . CHOLECYSTECTOMY    . ESOPHAGOGASTRODUODENOSCOPY N/A 10/23/2015   Procedure: ESOPHAGOGASTRODUODENOSCOPY (EGD);  Surgeon: Malissa Hippo, MD;  Location: [facility];  Service: Endoscopy;  Laterality: N/A;  . FLEXIBLE SIGMOIDOSCOPY N/A 11/07/2015   Procedure: FLEXIBLE SIGMOIDOSCOPY;  Surgeon: Malissa Hippo, MD;  Location: [facility];  Service:  Endoscopy;  Laterality: N/A;  . KNEE ARTHROSCOPY      OB History    No data available       Home Medications    Prior to Admission medications   Medication Sig Start Date End Date Taking? Authorizing Provider  bisacodyl (DULCOLAX) 10 MG suppository Place 1 suppository (10 mg total) rectally as needed for moderate constipation. 12/03/15   Rehman, Joline Maxcy, MD  diazepam (VALIUM) 10 MG tablet Take 10 mg by mouth every 6 (six) hours as needed for anxiety.  10/07/15   [provider]  dicyclomine (BENTYL) 10 MG capsule Take 1 capsule (10 mg total) by mouth 2 (two) times daily as needed for spasms. 12/03/15   Malissa Hippo, MD  doxycycline (VIBRAMYCIN) 100 MG capsule Take 1 capsule (100 mg total) by mouth 2 (two) times daily. 05/25/16   [name], PA-C  FLUoxetine (PROZAC) 40 MG capsule TK 1 C PO QD 10/02/15   [provider]  levothyroxine (SYNTHROID, LEVOTHROID) 112 MCG tablet Take 112 mcg by mouth daily. 04/17/15   [provider]  linaclotide Karlene Einstein) 72 MCG capsule Take 1 capsule (72 mcg total) by mouth daily before breakfast. 12/03/15   Rehman, Joline Maxcy, MD  LORazepam (ATIVAN) 1 MG tablet Take 1 tablet (1 mg total) by mouth 3 (three) times daily as needed for anxiety. 12/30/16   Donnetta Hutching, MD  pantoprazole (PROTONIX) 40 MG tablet Take 1 tablet (40 mg total) by mouth 2 (two) times daily. 10/23/15   Erick Blinks, MD  promethazine (PHENERGAN) 25 MG tablet  Take 1 tablet (25 mg total) by mouth 2 (two) times daily as needed for nausea or vomiting. 12/03/15   Rehman, Joline MaxcyNajeeb U, MD  vitamin B-12 (CYANOCOBALAMIN) 100 MCG tablet Take 100 mcg by mouth daily.    [provider]    Family History Family History  Problem Relation Age of Onset  . Diabetes Mother   . COPD Mother   . Heart disease Mother   . Breast cancer Paternal Grandmother   . Lung cancer Paternal Grandfather     Social History Social History  Substance Use Topics  . Smoking status:  Current Every Day Smoker    Packs/day: 1.00    Years: 20.00    Types: Cigarettes  . Smokeless tobacco: Never Used  . Alcohol use No     Comment: socially     Allergies   Morphine and related   Review of Systems Review of Systems  All other systems reviewed and are negative.    Physical Exam Updated Vital Signs BP 117/77   Pulse 87   Temp 98.2 F (36.8 C)   Resp 20   SpO2 98%   Physical Exam  Constitutional: She is oriented to person, place, and time. She appears well-developed and well-nourished.  HENT:  Head: Normocephalic and atraumatic.  Eyes: Conjunctivae are normal.  Neck: Neck supple.  Cardiovascular: Normal rate and regular rhythm.   Pulmonary/Chest: Effort normal and breath sounds normal.  Abdominal: Soft. Bowel sounds are normal.  Minimal epigastric tenderness.  Musculoskeletal: Normal range of motion.  Neurological: She is alert and oriented to person, place, and time.  Skin: Skin is warm and dry.  Psychiatric: She has a normal mood and affect. Her behavior is normal.  Nursing note and vitals reviewed.    ED Treatments / Results  Labs (all labs ordered are listed, but only abnormal results are displayed) Labs Reviewed  CBC - Abnormal; Notable for the following:       Result Value   WBC 12.6 (*)    All other components within normal limits  COMPREHENSIVE METABOLIC PANEL  LIPASE, BLOOD  I-STAT TROPOININ, ED    EKG  EKG Interpretation  Date/Time:  Wednesday December 30 2016 20:31:17 EDT Ventricular Rate:  104 PR Interval:    QRS Duration: 89 QT Interval:  341 QTC Calculation: 449 R Axis:   63 Text Interpretation:  Sinus tachycardia Consider right atrial enlargement Abnormal R-wave progression, early transition Abnormal inferior Q waves Baseline wander in lead(s) V2 V5 When compared with ECG of 11/25/2013, No significant change was found Confirmed by Dione BoozeGlick, David ([id number]) on 12/30/2016 11:19:40 PM       Radiology Dg Chest 2 View  Result  Date: 12/30/2016 CLINICAL DATA:  46 y/o F; left-sided chest pain and shortness of breath. EXAM: CHEST  2 VIEW COMPARISON:  07/17/2014 chest radiograph FINDINGS: The heart size and mediastinal contours are within normal limits. Both lungs are clear. The visualized skeletal structures are unremarkable. Right upper quadrant cholecystectomy clips. IMPRESSION: No active cardiopulmonary disease. Electronically Signed   By: Mitzi HansenLance  Furusawa-Stratton M.D.   On: 12/30/2016 21:29    Procedures Procedures (including critical care time)  Medications Ordered in ED Medications  sodium chloride 0.9 % bolus 1,000 mL (1,000 mLs Intravenous New Bag/Given 12/30/16 2134)  ondansetron (ZOFRAN) injection 4 mg (4 mg Intravenous Given 12/30/16 2136)  fentaNYL (SUBLIMAZE) injection 50 mcg (50 mcg Intravenous Given 12/30/16 2136)  LORazepam (ATIVAN) injection 1 mg (1 mg Intravenous Given 12/30/16 2137)  fentaNYL (SUBLIMAZE)  injection 50 mcg (50 mcg Intravenous Given 12/30/16 2250)  ondansetron (ZOFRAN) injection 4 mg (4 mg Intravenous Given 12/30/16 2249)     Initial Impression / Assessment and Plan / ED Course  I have reviewed the triage vital signs and the nursing notes.  Pertinent labs & imaging results that were available during my care of the patient were reviewed by me and considered in my medical decision making (see chart for details).     No acute abdomen. Chest pain workup negative. Patient feels better after IV fluids, IV pain medicine, IV Ativan. Patient is very stressed about her daughter.  Discharge medications Ativan 1 mg.  Final Clinical Impressions(s) / ED Diagnoses   Final diagnoses:  Chest pain, unspecified type  Epigastric pain    New Prescriptions New Prescriptions   LORAZEPAM (ATIVAN) 1 MG TABLET    Take 1 tablet (1 mg total) by mouth 3 (three) times daily as needed for anxiety.     Donnetta Hutching, MD 12/30/16 [phone]

## 2016-12-30 NOTE — ED Triage Notes (Signed)
Pt c/o left sided chest pain x1.5 hours. Pt also c/o upper abd pain.

## 2016-12-30 NOTE — ED Notes (Signed)
Patient transported to X-ray 

## 2017-02-24 DIAGNOSIS — F419 Anxiety disorder, unspecified: Secondary | ICD-10-CM | POA: Diagnosis not present

## 2017-02-24 DIAGNOSIS — E785 Hyperlipidemia, unspecified: Secondary | ICD-10-CM | POA: Diagnosis not present

## 2017-02-24 DIAGNOSIS — F321 Major depressive disorder, single episode, moderate: Secondary | ICD-10-CM | POA: Diagnosis not present

## 2017-02-24 DIAGNOSIS — E039 Hypothyroidism, unspecified: Secondary | ICD-10-CM | POA: Diagnosis not present

## 2017-03-03 ENCOUNTER — Telehealth (HOSPITAL_COMMUNITY): Payer: Self-pay | Admitting: Licensed Clinical Social Worker

## 2017-03-04 ENCOUNTER — Telehealth (HOSPITAL_COMMUNITY): Payer: Self-pay | Admitting: Professional

## 2017-03-22 ENCOUNTER — Telehealth (HOSPITAL_COMMUNITY): Payer: Self-pay | Admitting: Professional

## 2017-03-22 ENCOUNTER — Other Ambulatory Visit (HOSPITAL_COMMUNITY): Payer: BLUE CROSS/BLUE SHIELD | Attending: Psychiatry

## 2017-04-01 ENCOUNTER — Ambulatory Visit (HOSPITAL_COMMUNITY): Payer: BLUE CROSS/BLUE SHIELD | Admitting: Psychiatry

## 2017-06-19 IMAGING — CT CT ABD-PELV W/ CM
2 of 5 series · 16 of 46 positions shown, 18 images · IV contrast (Omnipaque 300)
Comparison: 10/18/2015

CLINICAL DATA: Left-sided abdominal pain

EXAM:
CT ABDOMEN AND PELVIS WITH CONTRAST
TECHNIQUE: Multidetector CT imaging of the abdomen and pelvis was performed
using the standard protocol following bolus administration of
intravenous contrast.
CONTRAST:  100mL 6U9SL7-ELL IOPAMIDOL (6U9SL7-ELL) INJECTION 61%

[Series 2: abd_pel_with 5.0 b40f · axial · 0.65mm/px · z∈[+674,+1064]mm · 13 of 90 slices shown, 15 images]
[im 6/90  soft-tissue]
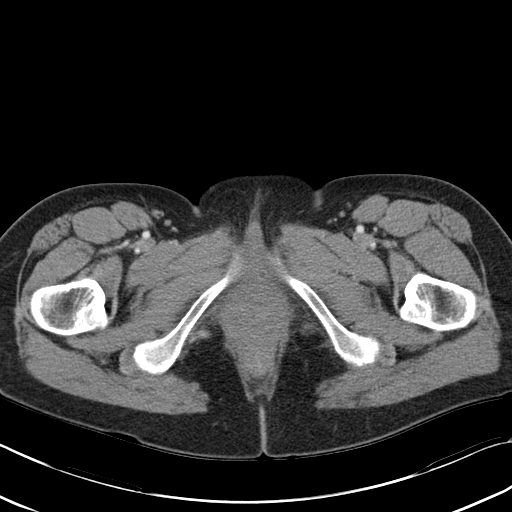
[im 6/90  bone]
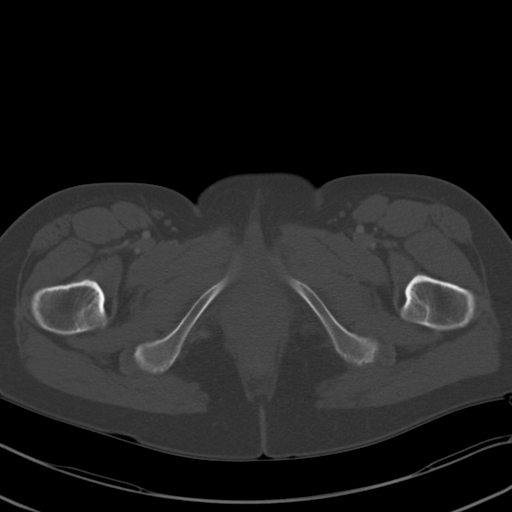
[im 11/90  soft-tissue]
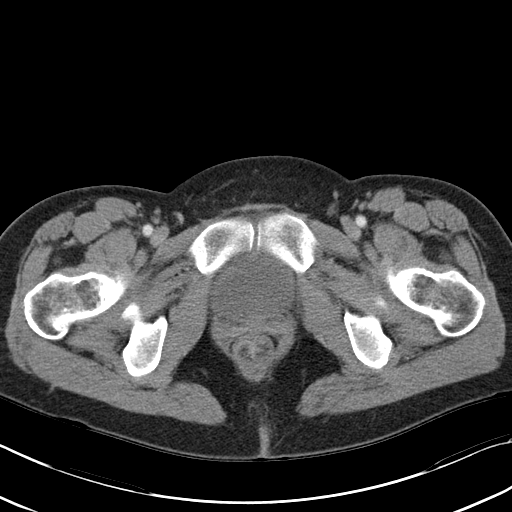
[im 21/90  soft-tissue]
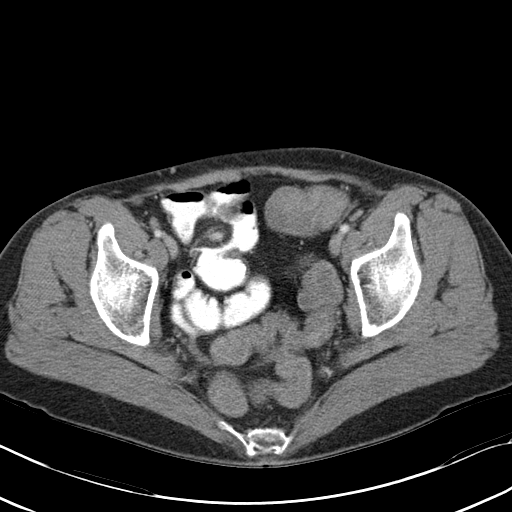
[im 27/90  soft-tissue]
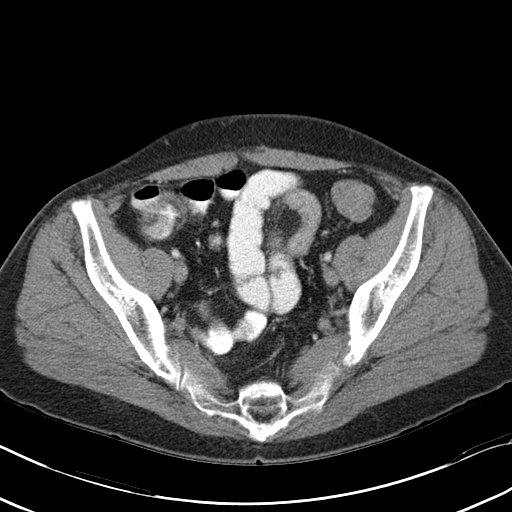
[im 32/90  soft-tissue]
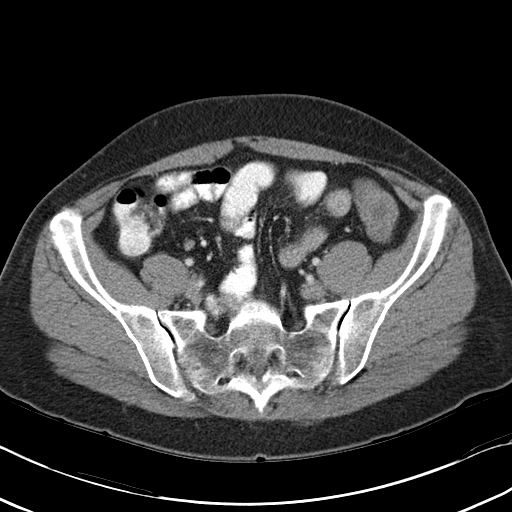
[im 37/90  soft-tissue]
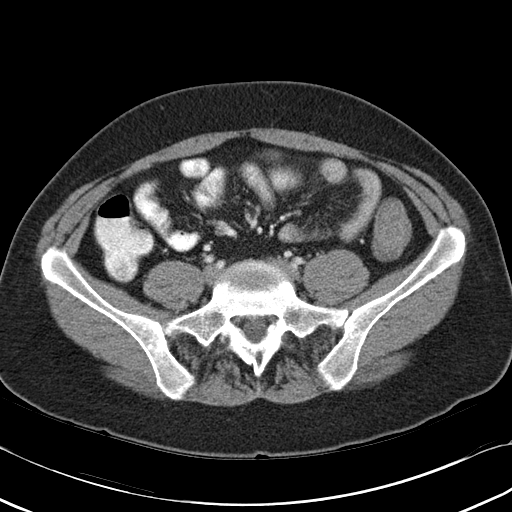
[im 48/90  soft-tissue]
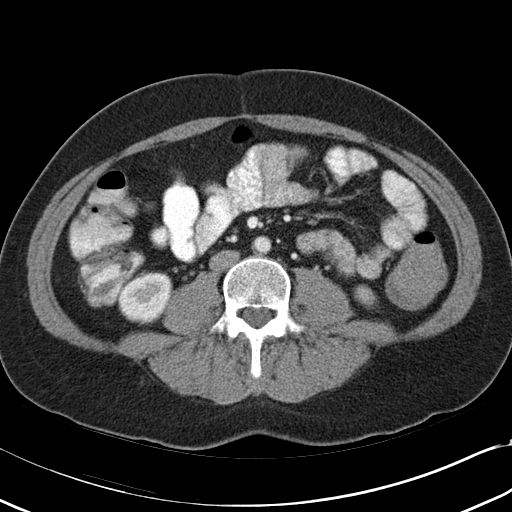
[im 53/90  soft-tissue]
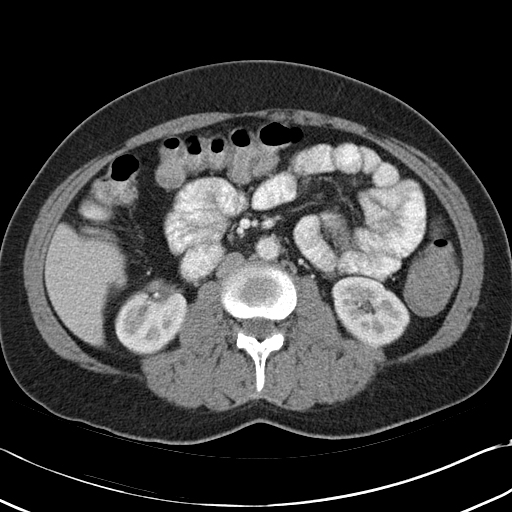
[im 58/90  soft-tissue]
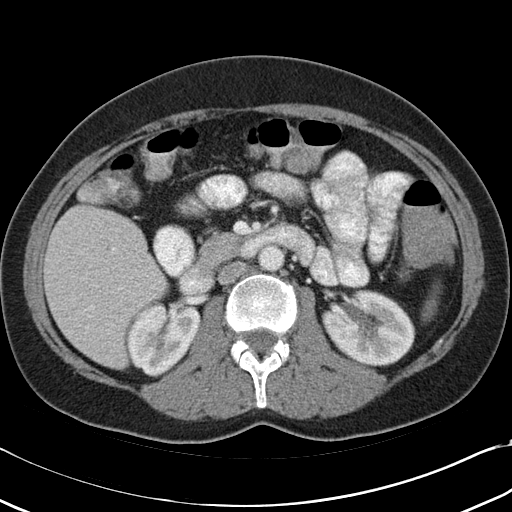
[im 58/90  bone]
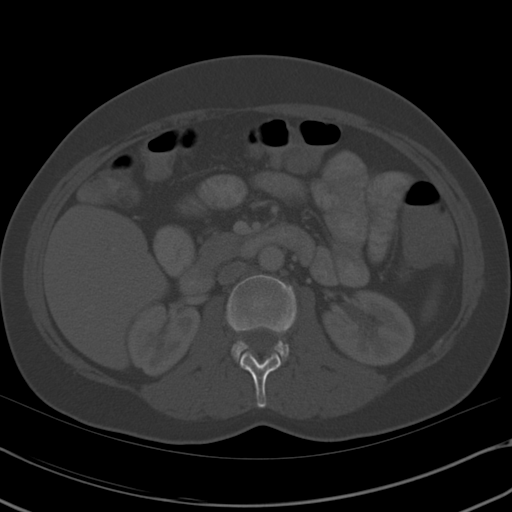
[im 63/90  soft-tissue]
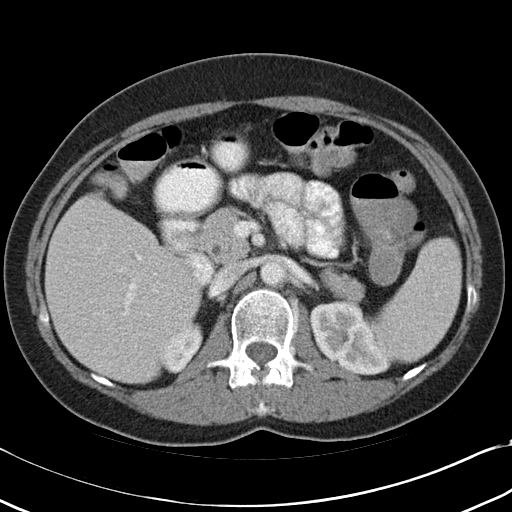
[im 69/90  soft-tissue]
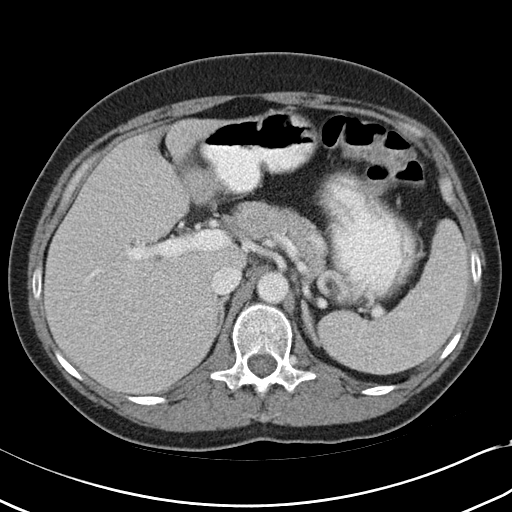
[im 79/90  soft-tissue]
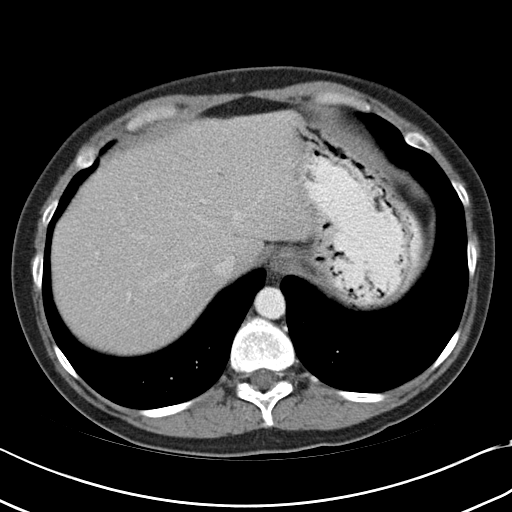
[im 84/90  soft-tissue]
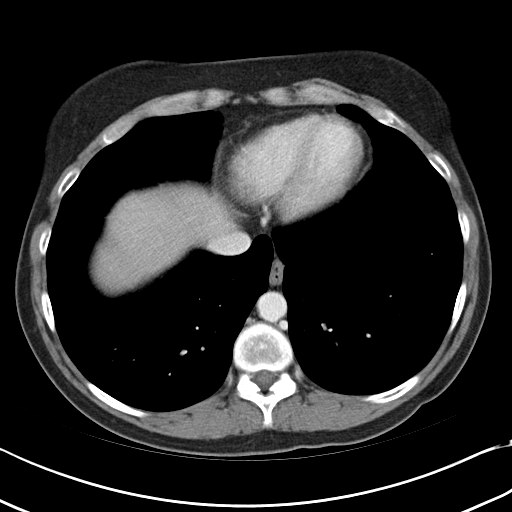

[Series 3: abd_pel_with 3.0 spo cor · coronal · 0.69mm/px · 3 of 92 slices shown]
[im 31/92  soft-tissue]
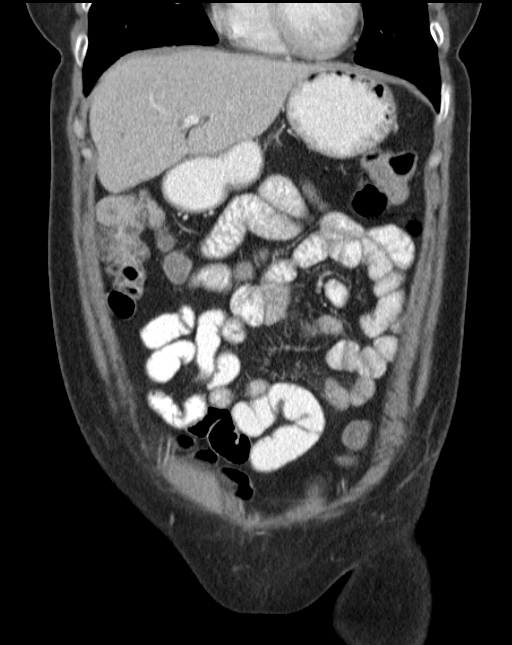
[im 41/92  soft-tissue]
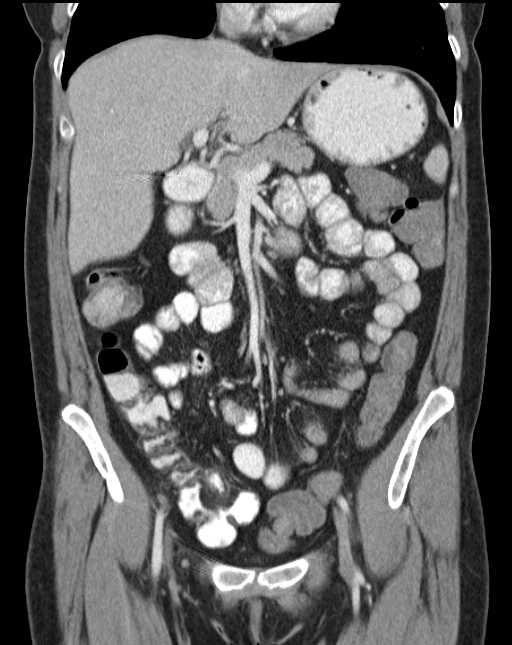
[im 51/92  soft-tissue]
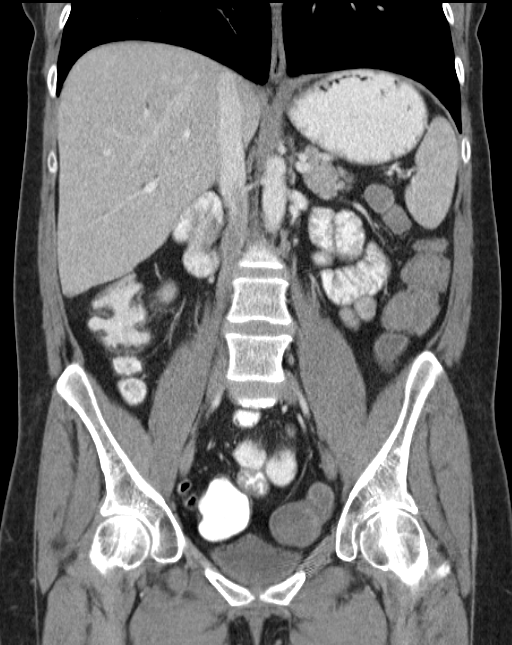

[16 of 46 positions shown; findings below may reference images not displayed]

FINDINGS: Lower chest:  Lung bases are clear.  Normal heart size.

Hepatobiliary: Normal liver.  Prior cholecystectomy.

Pancreas: Normal.

Spleen: Normal.

Adrenals/Urinary Tract: Normal adrenal glands. Normal kidneys. No
urolithiasis or obstructive uropathy. Normal bladder.

Stomach/Bowel: No bowel dilatation to suggest obstruction. Relative
bowel wall thickening involving the descending and sigmoid colon
concerning for mild colitis. Multiple air-fluid levels in the
transverse colon. No pneumatosis, pneumoperitoneum or portal venous
gas.

Vascular/Lymphatic: Normal caliber abdominal aorta. No
lymphadenopathy.

Reproductive: Prior hysterectomy.  No adnexal mass.

Other: No fluid collection or hematoma.

Musculoskeletal: No acute osseous abnormality. No lytic or sclerotic
osseous lesion. Small central disc protrusion at L5-S1. Bilateral
facet arthropathy at L5-S1.
IMPRESSION: 1. Relative bowel wall thickening involving the descending and
sigmoid colon concerning for mild colitis which may be secondary to
an infectious or inflammatory etiology.

## 2017-09-17 DIAGNOSIS — F322 Major depressive disorder, single episode, severe without psychotic features: Secondary | ICD-10-CM | POA: Diagnosis not present

## 2017-09-17 DIAGNOSIS — G43909 Migraine, unspecified, not intractable, without status migrainosus: Secondary | ICD-10-CM | POA: Diagnosis not present

## 2017-09-17 DIAGNOSIS — M25561 Pain in right knee: Secondary | ICD-10-CM | POA: Diagnosis not present

## 2017-09-17 DIAGNOSIS — M26603 Bilateral temporomandibular joint disorder, unspecified: Secondary | ICD-10-CM | POA: Diagnosis not present

## 2017-09-27 ENCOUNTER — Other Ambulatory Visit (HOSPITAL_COMMUNITY): Payer: Self-pay | Admitting: Pulmonary Disease

## 2017-09-27 DIAGNOSIS — R1900 Intra-abdominal and pelvic swelling, mass and lump, unspecified site: Secondary | ICD-10-CM

## 2017-10-08 ENCOUNTER — Ambulatory Visit (HOSPITAL_COMMUNITY): Admission: RE | Admit: 2017-10-08 | Payer: BLUE CROSS/BLUE SHIELD | Source: Ambulatory Visit

## 2017-10-22 DIAGNOSIS — F322 Major depressive disorder, single episode, severe without psychotic features: Secondary | ICD-10-CM | POA: Diagnosis not present

## 2017-10-22 DIAGNOSIS — M26603 Bilateral temporomandibular joint disorder, unspecified: Secondary | ICD-10-CM | POA: Diagnosis not present

## 2017-10-22 DIAGNOSIS — E785 Hyperlipidemia, unspecified: Secondary | ICD-10-CM | POA: Diagnosis not present

## 2017-10-22 DIAGNOSIS — G43909 Migraine, unspecified, not intractable, without status migrainosus: Secondary | ICD-10-CM | POA: Diagnosis not present

## 2017-11-01 NOTE — Progress Notes (Deleted)
Psychiatric Initial Adult Assessment   Patient Identification: [name] MRN:  [id number] Date of Evaluation:  11/01/2017 Referral Source: *** Chief Complaint:   Visit Diagnosis: No diagnosis found.  History of Present Illness:   [name] is a 47 y.o. year old female with a history of anxiety, ulcerative colitis,    Associated Signs/Symptoms: Depression Symptoms:  {DEPRESSION SYMPTOMS:20000} (Hypo) Manic Symptoms:  {BHH MANIC SYMPTOMS:22872} Anxiety Symptoms:  {BHH ANXIETY SYMPTOMS:22873} Psychotic Symptoms:  {BHH PSYCHOTIC SYMPTOMS:22874} PTSD Symptoms: {BHH PTSD SYMPTOMS:22875}  Past Psychiatric History:  Outpatient:  Psychiatry admission:  Previous suicide attempt:  Past trials of medication:  History of violence:   Previous Psychotropic Medications: {YES/NO:21197}  Substance Abuse History in the last 12 months:  {yes no:314532}  Consequences of Substance Abuse: {BHH CONSEQUENCES OF SUBSTANCE ABUSE:22880}  Past Medical History:  Past Medical History:  Diagnosis Date  . Anxiety   . Diverticula of intestine   . IBS (irritable bowel syndrome)   . Migraines   . Thyroid disease   . Ulcerative colitis   . Vertigo     Past Surgical History:  Procedure Laterality Date  . ABDOMINAL HYSTERECTOMY    . APPENDECTOMY    . CHOLECYSTECTOMY    . ESOPHAGOGASTRODUODENOSCOPY N/A 10/23/2015   Procedure: ESOPHAGOGASTRODUODENOSCOPY (EGD);  Surgeon: Malissa Hippo, MD;  Location: [facility];  Service: Endoscopy;  Laterality: N/A;  . FLEXIBLE SIGMOIDOSCOPY N/A 11/07/2015   Procedure: FLEXIBLE SIGMOIDOSCOPY;  Surgeon: Malissa Hippo, MD;  Location: [facility];  Service: Endoscopy;  Laterality: N/A;  . KNEE ARTHROSCOPY      Family Psychiatric History: ***  Family History:  Family History  Problem Relation Age of Onset  . Diabetes Mother   . COPD Mother   . Heart disease Mother   . Breast cancer Paternal Grandmother   . Lung cancer Paternal Grandfather      Social History:   Social History   Socioeconomic History  . Marital status: Married    Spouse name: Not on file  . Number of children: Not on file  . Years of education: Not on file  . Highest education level: Not on file  Occupational History  . Not on file  Social Needs  . Financial resource strain: Not on file  . Food insecurity:    Worry: Not on file    Inability: Not on file  . Transportation needs:    Medical: Not on file    Non-medical: Not on file  Tobacco Use  . Smoking status: Current Every Day Smoker    Packs/day: 1.00    Years: 20.00    Pack years: 20.00    Types: Cigarettes  . Smokeless tobacco: Never Used  Substance and Sexual Activity  . Alcohol use: No    Alcohol/week: 0.0 oz    Comment: socially  . Drug use: No  . Sexual activity: Yes    Birth control/protection: Surgical  Lifestyle  . Physical activity:    Days per week: Not on file    Minutes per session: Not on file  . Stress: Not on file  Relationships  . Social connections:    Talks on phone: Not on file    Gets together: Not on file    Attends religious service: Not on file    Active member of club or organization: Not on file    Attends meetings of clubs or organizations: Not on file    Relationship status: Not on file  Other Topics Concern  .  Not on file  Social History Narrative  . Not on file    Additional Social History: ***  Allergies:   Allergies  Allergen Reactions  . Morphine And Related Itching and Rash    Metabolic Disorder Labs: Lab Results  Component Value Date   HGBA1C  07/12/2007    5.5 (NOTE)   The ADA recommends the following therapeutic goals for glycemic   control related to Hgb A1C measurement:   Goal of Therapy:   < 7.0% Hgb A1C   Action Suggested:  > 8.0% Hgb A1C   Ref:  Diabetes Care, 22, Suppl. 1, 1999   MPG 119 07/12/2007   No results found for: PROLACTIN Lab Results  Component Value Date   CHOL 197 01/17/2013   TRIG 171 (H) 01/17/2013   HDL  32 (L) 01/17/2013   CHOLHDL 6.2 01/17/2013   VLDL 34 01/17/2013   LDLCALC 131 (H) 01/17/2013   LDLCALC 102 (H) 08/31/2011     Current Medications: Current Outpatient Medications  Medication Sig Dispense Refill  . bisacodyl (DULCOLAX) 10 MG suppository Place 1 suppository (10 mg total) rectally as needed for moderate constipation. 12 suppository 0  . diazepam (VALIUM) 10 MG tablet Take 10 mg by mouth every 6 (six) hours as needed for anxiety.   4  . dicyclomine (BENTYL) 10 MG capsule Take 1 capsule (10 mg total) by mouth 2 (two) times daily as needed for spasms. 60 capsule 0  . doxycycline (VIBRAMYCIN) 100 MG capsule Take 1 capsule (100 mg total) by mouth 2 (two) times daily. 14 capsule 0  . FLUoxetine (PROZAC) 40 MG capsule TK 1 C PO QD  11  . levothyroxine (SYNTHROID, LEVOTHROID) 112 MCG tablet Take 112 mcg by mouth daily.  10  . linaclotide (LINZESS) 72 MCG capsule Take 1 capsule (72 mcg total) by mouth daily before breakfast. 30 capsule 5  . LORazepam (ATIVAN) 1 MG tablet Take 1 tablet (1 mg total) by mouth 3 (three) times daily as needed for anxiety. 15 tablet 0  . pantoprazole (PROTONIX) 40 MG tablet Take 1 tablet (40 mg total) by mouth 2 (two) times daily. 60 tablet 1  . promethazine (PHENERGAN) 25 MG tablet Take 1 tablet (25 mg total) by mouth 2 (two) times daily as needed for nausea or vomiting. 20 tablet 1  . vitamin B-12 (CYANOCOBALAMIN) 100 MCG tablet Take 100 mcg by mouth daily.     No current facility-administered medications for this visit.     Neurologic: Headache: No Seizure: No Paresthesias:No  Musculoskeletal: Strength & Muscle Tone: within normal limits Gait & Station: normal Patient leans: N/A  Psychiatric Specialty Exam: ROS  There were no vitals taken for this visit.There is no height or weight on file to calculate BMI.  General Appearance: Fairly Groomed  Eye Contact:  Good  Speech:  Clear and Coherent  Volume:  Normal  Mood:  {BHH MOOD:22306}   Affect:  {Affect (PAA):22687}  Thought Process:  Coherent  Orientation:  Full (Time, Place, and Person)  Thought Content:  Logical  Suicidal Thoughts:  {ST/HT (PAA):22692}  Homicidal Thoughts:  {ST/HT (PAA):22692}  Memory:  Immediate;   Good  Judgement:  {Judgement (PAA):22694}  Insight:  {Insight (PAA):22695}  Psychomotor Activity:  Normal  Concentration:  Concentration: Good and Attention Span: Good  Recall:  Good  Fund of Knowledge:Good  Language: Good  Akathisia:  No  Handed:  Right  AIMS (if indicated):  N/A  Assets:  Communication Skills Desire for Improvement  ADL's:  Intact  Cognition: WNL  Sleep:  ***   Assessment  Plan  The patient demonstrates the following risk factors for suicide: Chronic risk factors for suicide include: {Chronic Risk Factors for OZHYQMV:78469629}. Acute risk factors for suicide include: {Acute Risk Factors for BMWUXLK:44010272}. Protective factors for this patient include: {Protective Factors for Suicide ZDGU:44034742}. Considering these factors, the overall suicide risk at this point appears to be {Desc; low/moderate/high:110033}. Patient {ACTION; IS/IS VZD:63875643} appropriate for outpatient follow up.   Treatment Plan Summary: Plan as above   Neysa Hotter, MD 5/13/20198:27 AM

## 2017-11-02 ENCOUNTER — Ambulatory Visit (HOSPITAL_COMMUNITY): Payer: BLUE CROSS/BLUE SHIELD | Admitting: Psychiatry

## 2017-11-17 DIAGNOSIS — Z6826 Body mass index (BMI) 26.0-26.9, adult: Secondary | ICD-10-CM | POA: Diagnosis not present

## 2017-11-17 DIAGNOSIS — S83281A Other tear of lateral meniscus, current injury, right knee, initial encounter: Secondary | ICD-10-CM | POA: Diagnosis not present

## 2017-11-17 DIAGNOSIS — M5410 Radiculopathy, site unspecified: Secondary | ICD-10-CM | POA: Diagnosis not present

## 2017-11-17 DIAGNOSIS — M25561 Pain in right knee: Secondary | ICD-10-CM | POA: Diagnosis not present

## 2017-11-18 ENCOUNTER — Ambulatory Visit (HOSPITAL_COMMUNITY): Admission: RE | Admit: 2017-11-18 | Payer: BLUE CROSS/BLUE SHIELD | Source: Ambulatory Visit

## 2017-11-22 ENCOUNTER — Telehealth: Payer: Self-pay | Admitting: Neurology

## 2017-11-22 ENCOUNTER — Ambulatory Visit: Payer: BLUE CROSS/BLUE SHIELD | Admitting: Neurology

## 2017-11-22 NOTE — Telephone Encounter (Signed)
Pt no showed to apt today 

## 2017-11-23 ENCOUNTER — Encounter: Payer: Self-pay | Admitting: Neurology

## 2017-11-30 DIAGNOSIS — M25561 Pain in right knee: Secondary | ICD-10-CM | POA: Diagnosis not present

## 2017-11-30 DIAGNOSIS — S83281A Other tear of lateral meniscus, current injury, right knee, initial encounter: Secondary | ICD-10-CM | POA: Diagnosis not present

## 2017-12-06 DIAGNOSIS — Z6826 Body mass index (BMI) 26.0-26.9, adult: Secondary | ICD-10-CM | POA: Diagnosis not present

## 2017-12-06 DIAGNOSIS — M2341 Loose body in knee, right knee: Secondary | ICD-10-CM | POA: Diagnosis not present

## 2017-12-17 DIAGNOSIS — M2341 Loose body in knee, right knee: Secondary | ICD-10-CM | POA: Diagnosis not present

## 2017-12-17 DIAGNOSIS — S83281A Other tear of lateral meniscus, current injury, right knee, initial encounter: Secondary | ICD-10-CM | POA: Diagnosis not present

## 2017-12-17 DIAGNOSIS — M25561 Pain in right knee: Secondary | ICD-10-CM | POA: Diagnosis not present

## 2017-12-30 ENCOUNTER — Ambulatory Visit (HOSPITAL_COMMUNITY): Admission: RE | Admit: 2017-12-30 | Payer: BLUE CROSS/BLUE SHIELD | Source: Ambulatory Visit

## 2018-02-03 ENCOUNTER — Ambulatory Visit: Payer: BLUE CROSS/BLUE SHIELD | Admitting: Neurology

## 2018-02-03 ENCOUNTER — Telehealth: Payer: Self-pay | Admitting: Neurology

## 2018-02-03 NOTE — Telephone Encounter (Signed)
FYI- this patient has no-showed to 2 New Patient appts in 2019

## 2018-02-08 ENCOUNTER — Encounter: Payer: Self-pay | Admitting: Neurology

## 2018-03-01 ENCOUNTER — Other Ambulatory Visit: Payer: Self-pay

## 2018-03-01 ENCOUNTER — Encounter (HOSPITAL_COMMUNITY): Payer: Self-pay | Admitting: Emergency Medicine

## 2018-03-01 ENCOUNTER — Emergency Department (HOSPITAL_COMMUNITY)
Admission: EM | Admit: 2018-03-01 | Discharge: 2018-03-01 | Disposition: A | Discharge status: Home / Self Care | Payer: BLUE CROSS/BLUE SHIELD | Attending: Emergency Medicine | Admitting: Emergency Medicine

## 2018-03-01 ENCOUNTER — Emergency Department (HOSPITAL_COMMUNITY): Payer: BLUE CROSS/BLUE SHIELD

## 2018-03-01 DIAGNOSIS — R112 Nausea with vomiting, unspecified: Secondary | ICD-10-CM | POA: Diagnosis not present

## 2018-03-01 DIAGNOSIS — Z79899 Other long term (current) drug therapy: Secondary | ICD-10-CM | POA: Diagnosis not present

## 2018-03-01 DIAGNOSIS — F430 Acute stress reaction: Secondary | ICD-10-CM | POA: Diagnosis not present

## 2018-03-01 DIAGNOSIS — R52 Pain, unspecified: Secondary | ICD-10-CM | POA: Diagnosis not present

## 2018-03-01 DIAGNOSIS — G4489 Other headache syndrome: Secondary | ICD-10-CM | POA: Diagnosis not present

## 2018-03-01 DIAGNOSIS — R42 Dizziness and giddiness: Secondary | ICD-10-CM | POA: Diagnosis not present

## 2018-03-01 DIAGNOSIS — R519 Headache, unspecified: Secondary | ICD-10-CM

## 2018-03-01 DIAGNOSIS — R51 Headache: Secondary | ICD-10-CM | POA: Diagnosis not present

## 2018-03-01 DIAGNOSIS — R55 Syncope and collapse: Secondary | ICD-10-CM | POA: Diagnosis not present

## 2018-03-01 DIAGNOSIS — E039 Hypothyroidism, unspecified: Secondary | ICD-10-CM | POA: Insufficient documentation

## 2018-03-01 DIAGNOSIS — I10 Essential (primary) hypertension: Secondary | ICD-10-CM | POA: Diagnosis not present

## 2018-03-01 DIAGNOSIS — F17210 Nicotine dependence, cigarettes, uncomplicated: Secondary | ICD-10-CM | POA: Insufficient documentation

## 2018-03-01 DIAGNOSIS — R000 Tachycardia, unspecified: Secondary | ICD-10-CM | POA: Diagnosis not present

## 2018-03-01 LAB — COMPREHENSIVE METABOLIC PANEL
ALT: 22 U/L (ref 0–44)
ANION GAP: 9 (ref 5–15)
AST: 26 U/L (ref 15–41)
Albumin: 4.3 g/dL (ref 3.5–5.0)
Alkaline Phosphatase: 69 U/L (ref 38–126)
BUN: 11 mg/dL (ref 6–20)
CHLORIDE: 114 mmol/L — AB (ref 98–111)
CO2: 18 mmol/L — ABNORMAL LOW (ref 22–32)
Calcium: 9.4 mg/dL (ref 8.9–10.3)
Creatinine, Ser: 0.96 mg/dL (ref 0.44–1.00)
GFR calc Af Amer: >60 mL/min (ref >60)
Glucose, Bld: 115 mg/dL — ABNORMAL HIGH (ref 70–99)
POTASSIUM: 3.8 mmol/L (ref 3.5–5.1)
Sodium: 141 mmol/L (ref 135–145)
TOTAL PROTEIN: 6.9 g/dL (ref 6.5–8.1)
Total Bilirubin: 0.6 mg/dL (ref 0.3–1.2)

## 2018-03-01 LAB — CBC WITH DIFFERENTIAL/PLATELET
BASOS PCT: 0 %
Basophils Absolute: 0 10*3/uL (ref 0.0–0.1)
EOS PCT: 2 %
Eosinophils Absolute: 0.1 10*3/uL (ref 0.0–0.7)
HCT: 38 % (ref 36.0–46.0)
Hemoglobin: 13.2 g/dL (ref 12.0–15.0)
Lymphocytes Relative: 25 %
Lymphs Abs: 2 10*3/uL (ref 0.7–4.0)
MCH: 31.7 pg (ref 26.0–34.0)
MCHC: 34.7 g/dL (ref 30.0–36.0)
MCV: 91.3 fL (ref 78.0–100.0)
MONO ABS: 0.5 10*3/uL (ref 0.1–1.0)
MONOS PCT: 6 %
Neutro Abs: 5.4 10*3/uL (ref 1.7–7.7)
Neutrophils Relative %: 67 %
PLATELETS: 250 10*3/uL (ref 150–400)
RBC: 4.16 MIL/uL (ref 3.87–5.11)
RDW: 12.7 % (ref 11.5–15.5)
WBC: 8 10*3/uL (ref 4.0–10.5)

## 2018-03-01 LAB — TSH: TSH: 0.193 u[IU]/mL — AB (ref 0.350–4.500)

## 2018-03-01 MED ORDER — METHYLPREDNISOLONE SODIUM SUCC 125 MG IJ SOLR
125.0000 mg | Freq: Once | INTRAMUSCULAR | Status: AC
Start: 2018-03-01 — End: 2018-03-01
  Administered 2018-03-01: 125 mg via INTRAVENOUS
  Filled 2018-03-01: qty 2

## 2018-03-01 MED ORDER — METOCLOPRAMIDE HCL 10 MG PO TABS: 10.0000 mg | ORAL_TABLET | Freq: Four times a day (QID) | ORAL | 0 refills | Status: DC | PRN

## 2018-03-01 MED ORDER — IBUPROFEN 600 MG PO TABS: 600.0000 mg | ORAL_TABLET | Freq: Three times a day (TID) | ORAL | 0 refills | Status: DC | PRN

## 2018-03-01 MED ORDER — ONDANSETRON HCL 4 MG/2ML IJ SOLN
4.0000 mg | Freq: Once | INTRAMUSCULAR | Status: AC
  Administered 2018-03-01: 4 mg via INTRAVENOUS
  Filled 2018-03-01: qty 2

## 2018-03-01 MED ORDER — KETOROLAC TROMETHAMINE 30 MG/ML IJ SOLN
30.0000 mg | Freq: Once | INTRAMUSCULAR | Status: AC
  Administered 2018-03-01: 30 mg via INTRAVENOUS
  Filled 2018-03-01: qty 1

## 2018-03-01 MED ORDER — SODIUM CHLORIDE 0.9 % IV BOLUS
1000.0000 mL | Freq: Once | INTRAVENOUS | Status: AC
  Administered 2018-03-01: 1000 mL via INTRAVENOUS

## 2018-03-01 MED ORDER — ACETAMINOPHEN 500 MG PO TABS
1000.0000 mg | ORAL_TABLET | Freq: Once | ORAL | Status: AC
  Administered 2018-03-01: 1000 mg via ORAL
  Filled 2018-03-01: qty 2

## 2018-03-01 MED ORDER — METOCLOPRAMIDE HCL 5 MG/ML IJ SOLN
10.0000 mg | Freq: Once | INTRAMUSCULAR | Status: AC
  Administered 2018-03-01: 10 mg via INTRAVENOUS
  Filled 2018-03-01: qty 2

## 2018-03-01 MED ORDER — LORAZEPAM 2 MG/ML IJ SOLN
0.5000 mg | Freq: Once | INTRAMUSCULAR | Status: AC
  Administered 2018-03-01: 0.5 mg via INTRAVENOUS
  Filled 2018-03-01: qty 1

## 2018-03-01 NOTE — ED Provider Notes (Signed)
[facility] EMERGENCY DEPARTMENT Provider Note   CSN: [id number] Arrival date & time: 03/01/18  1510     History   Chief Complaint Chief Complaint  Patient presents with  . Headache    HPI [name] is a 47 y.o. female.  HPI Patient with a history of migraines presents today with posterior headache.  States she had an [place] what she described as seeing yellow light earlier today.  Also states she has had low-grade "stress" type headache for the past few days.  While she was in the bathroom today had acute worsening of her posterior headache.  This occurred at 230 today.  That she fell to the ground.  Does not remember falling.  Relative heard the patient fall and called EMS.  She is complaining of posterior headache, nausea and occasional spinning sensation.  Denies neck pain.  Denies focal weakness or numbness.  She is very anxious and states she is been under lots of stress recently.  She left her husband, has been caring for her 3 grandchildren, has been caring for her ex-husband who is dying.  Past Medical History:  Diagnosis Date  . Anxiety   . Diverticula of intestine   . IBS (irritable bowel syndrome)   . Migraines   . Thyroid disease   . Ulcerative colitis   . Vertigo     Patient Active Problem List   Diagnosis Date Noted  . Colitis 11/06/2015  . Vomiting and diarrhea 11/06/2015  . GERD (gastroesophageal reflux disease) 11/06/2015  . Thrombocytopenia (HCC) 10/21/2015  . Hypokalemia 10/19/2015  . Hypothyroidism, adult 10/19/2015  . Hypovolemia 10/19/2015  . Enterocolitis 10/18/2015  . Headache in front of head 01/17/2013  . Hyperlipidemia 01/17/2013  . Chest pain 01/16/2013  . Tobacco abuse 01/16/2013  . Cellulitis of buttock, left 03/14/2012  . Fatty liver 09/01/2011  . Anxiety disorder 09/01/2011  . Abdominal pain 09/01/2011  . Pancreatitis 08/30/2011  . Leukocytosis 08/30/2011    Past Surgical History:  Procedure Laterality Date  . ABDOMINAL  HYSTERECTOMY    . APPENDECTOMY    . CHOLECYSTECTOMY    . ESOPHAGOGASTRODUODENOSCOPY N/A 10/23/2015   Procedure: ESOPHAGOGASTRODUODENOSCOPY (EGD);  Surgeon: Malissa Hippo, MD;  Location: [facility];  Service: Endoscopy;  Laterality: N/A;  . FLEXIBLE SIGMOIDOSCOPY N/A 11/07/2015   Procedure: FLEXIBLE SIGMOIDOSCOPY;  Surgeon: Malissa Hippo, MD;  Location: [facility];  Service: Endoscopy;  Laterality: N/A;  . KNEE ARTHROSCOPY       OB History    Gravida      Para      Term      Preterm      AB      Living  3     SAB      TAB      Ectopic      Multiple      Live Births               Home Medications    Prior to Admission medications   Medication Sig Start Date End Date Taking? Authorizing Provider  diazepam (VALIUM) 10 MG tablet Take 10 mg by mouth every 6 (six) hours as needed for anxiety.  10/07/15  Yes [provider]  levothyroxine (SYNTHROID, LEVOTHROID) 200 MCG tablet Take 200 mcg by mouth daily before breakfast.  01/27/18  Yes [provider]  FLUoxetine (PROZAC) 40 MG capsule Take 40 mg by mouth daily.  10/02/15   [provider]  ibuprofen (ADVIL,MOTRIN) 600 MG  tablet Take 1 tablet (600 mg total) by mouth 3 (three) times daily with meals as needed. 03/01/18   Loren Racer, MD  linaclotide [facility]) 72 MCG capsule Take 1 capsule (72 mcg total) by mouth daily before breakfast. 12/03/15   Rehman, Joline Maxcy, MD  metoCLOPramide (REGLAN) 10 MG tablet Take 1 tablet (10 mg total) by mouth every 6 (six) hours as needed for nausea. 03/01/18   Loren Racer, MD  pantoprazole (PROTONIX) 40 MG tablet Take 1 tablet (40 mg total) by mouth 2 (two) times daily. 10/23/15   Erick Blinks, MD  QUEtiapine (SEROQUEL) 25 MG tablet Take 25 mg by mouth at bedtime.    [provider]  vitamin B-12 (CYANOCOBALAMIN) 100 MCG tablet Take 100 mcg by mouth daily.    [provider]    Family History Family History  Problem Relation Age  of Onset  . Diabetes Mother   . COPD Mother   . Heart disease Mother   . Breast cancer Paternal Grandmother   . Lung cancer Paternal Grandfather     Social History Social History   Tobacco Use  . Smoking status: Current Every Day Smoker    Packs/day: 1.00    Years: 20.00    Pack years: 20.00    Types: Cigarettes  . Smokeless tobacco: Never Used  Substance Use Topics  . Alcohol use: No    Alcohol/week: 0.0 standard drinks    Comment: socially  . Drug use: No     Allergies   Morphine and related   Review of Systems Review of Systems  Constitutional: Negative for chills and fever.  HENT: Negative for congestion, facial swelling, sinus pressure, sinus pain, sore throat, trouble swallowing and voice change.   Eyes: Positive for photophobia. Negative for visual disturbance.  Respiratory: Negative for cough and shortness of breath.   Cardiovascular: Negative for chest pain.  Gastrointestinal: Positive for nausea. Negative for abdominal pain, constipation, diarrhea and vomiting.  Musculoskeletal: Negative for back pain, neck pain and neck stiffness.  Skin: Negative for rash and wound.  Neurological: Positive for dizziness, syncope and headaches. Negative for speech difficulty, weakness and numbness.  Psychiatric/Behavioral: The patient is nervous/anxious.   All other systems reviewed and are negative.    Physical Exam Updated Vital Signs BP 107/61   Pulse 66   Temp 98.4 F (36.9 C) (Oral)   Resp 16   Ht 5\' 6"  (1.676 m)   Wt 77.1 kg   SpO2 97%   BMI 27.44 kg/m   Physical Exam  Constitutional: She is oriented to person, place, and time. She appears well-developed and well-nourished.  Very anxious and tearful  HENT:  Head: Normocephalic and atraumatic.  Mouth/Throat: Oropharynx is clear and moist.  No obvious head trauma.  Patient does have mild posterior scalp tenderness to palpation without hematoma present.  Midface is stable.  No malocclusion.  No intraoral  trauma.  Eyes: Pupils are equal, round, and reactive to light. EOM are normal.  Few beats of horizontal nystagmus.  Neck: Normal range of motion. Neck supple.  No posterior midline cervical tenderness to palpation.  No meningismus.  Patient does have tenderness to palpation in the bilateral paracervical and trapezius muscles with spasm  Cardiovascular: Regular rhythm. Exam reveals no gallop and no friction rub.  No murmur heard. Tachycardia.  Pulmonary/Chest: Effort normal and breath sounds normal. No stridor. No respiratory distress. She has no wheezes. She has no rales. She exhibits no tenderness.  Abdominal: Soft. Bowel sounds are  normal. There is no tenderness. There is no rebound and no guarding.  Musculoskeletal: Normal range of motion. She exhibits no edema or tenderness.  Neurological: She is alert and oriented to person, place, and time.  Patient is alert and oriented x3 with clear, goal oriented speech. Patient has 5/5 motor in all extremities. Sensation is intact to light touch. Bilateral finger-to-nose is normal with no signs of dysmetria.  Skin: Skin is warm and dry. No rash noted. No erythema.  Psychiatric:  Very anxious but distractible.  Nursing note and vitals reviewed.    ED Treatments / Results  Labs (all labs ordered are listed, but only abnormal results are displayed) Labs Reviewed  COMPREHENSIVE METABOLIC PANEL - Abnormal; Notable for the following components:      Result Value   Chloride 114 (*)    CO2 18 (*)    Glucose, Bld 115 (*)    All other components within normal limits  TSH - Abnormal; Notable for the following components:   TSH 0.193 (*)    All other components within normal limits  CBC WITH DIFFERENTIAL/PLATELET    EKG EKG Interpretation  Date/Time:  Tuesday March 01 2018 16:07:03 EDT Ventricular Rate:  115 PR Interval:    QRS Duration: 95 QT Interval:  393 QTC Calculation: 544 R Axis:   71 Text Interpretation:  Sinus tachycardia  Abnormal R-wave progression, early transition Prolonged QT interval Confirmed by Loren Racer ([id number]) on 03/01/2018 8:17:33 PM   Radiology Ct Head Wo Contrast  Result Date: 03/01/2018 CLINICAL DATA:  Severe onset of short appendix. Nausea and vomiting today. EXAM: CT HEAD WITHOUT CONTRAST TECHNIQUE: Contiguous axial images were obtained from the base of the skull through the vertex without intravenous contrast. COMPARISON:  10/05/2015 FINDINGS: Brain: There is no mass effect, midline shift, or acute hemorrhage. Vascular: No hyperdense vessel or unexpected calcification. Skull: Cranium is intact. Sinuses/Orbits: Mastoid air cells are clear. Mucous retention cyst and mucosal thickening in the right maxillary sinus. Remainder of the paranasal sinuses visualized in this study are grossly clear. Unremarkable orbits. Other: Noncontributory. IMPRESSION: No acute intracranial pathology. Electronically Signed   By: Jolaine Click M.D.   On: 03/01/2018 15:38    Procedures Procedures (including critical care time)  Medications Ordered in ED Medications  sodium chloride 0.9 % bolus 1,000 mL (0 mLs Intravenous Stopped 03/01/18 1743)  ketorolac (TORADOL) 30 MG/ML injection 30 mg (30 mg Intravenous Given 03/01/18 1615)  metoCLOPramide (REGLAN) injection 10 mg (10 mg Intravenous Given 03/01/18 1615)  methylPREDNISolone sodium succinate (SOLU-MEDROL) 125 mg/2 mL injection 125 mg (125 mg Intravenous Given 03/01/18 1615)  LORazepam (ATIVAN) injection 0.5 mg (0.5 mg Intravenous Given 03/01/18 1615)  acetaminophen (TYLENOL) tablet 1,000 mg (1,000 mg Oral Given 03/01/18 1743)  ondansetron (ZOFRAN) injection 4 mg (4 mg Intravenous Given 03/01/18 1742)     Initial Impression / Assessment and Plan / ED Course  I have reviewed the triage vital signs and the nursing notes.  Pertinent labs & imaging results that were available during my care of the patient were reviewed by me and considered in my medical decision making  (see chart for details).     Patient's headache has significantly improved though still mildly present.  She is much more calm now.  Continues to have a normal neurologic exam.  CT head without acute findings.  Likely stress-induced acute exacerbation of her chronic headaches.  Advised to follow-up with neurology if her symptoms persist.  Return precautions have been given.  Final  Clinical Impressions(s) / ED Diagnoses   Final diagnoses:  Intractable headache, unspecified chronicity pattern, unspecified headache type  Stress reaction    ED Discharge Orders         Ordered    metoCLOPramide (REGLAN) 10 MG tablet  Every 6 hours PRN     03/01/18 2016    ibuprofen (ADVIL,MOTRIN) 600 MG tablet  3 times daily with meals PRN     03/01/18 2016           Loren Racer, MD 03/01/18 2018

## 2018-03-01 NOTE — ED Triage Notes (Signed)
PT brought in by [facility] for c/o severe onset of sharp headache, nausea and vomiting that started around 1430 today. Her husband stated he heard a loud noise and she had fallen in the bathroom.

## 2018-04-05 ENCOUNTER — Emergency Department (HOSPITAL_COMMUNITY)
Admission: EM | Admit: 2018-04-05 | Discharge: 2018-04-06 | Disposition: A | Discharge status: Other Acute Inpatient Hospital | Payer: BLUE CROSS/BLUE SHIELD | Source: Home / Self Care | Attending: Emergency Medicine | Admitting: Emergency Medicine

## 2018-04-05 ENCOUNTER — Other Ambulatory Visit: Payer: Self-pay

## 2018-04-05 ENCOUNTER — Encounter (HOSPITAL_COMMUNITY): Payer: Self-pay | Admitting: Emergency Medicine

## 2018-04-05 DIAGNOSIS — E039 Hypothyroidism, unspecified: Secondary | ICD-10-CM

## 2018-04-05 DIAGNOSIS — F17210 Nicotine dependence, cigarettes, uncomplicated: Secondary | ICD-10-CM

## 2018-04-05 DIAGNOSIS — Z801 Family history of malignant neoplasm of trachea, bronchus and lung: Secondary | ICD-10-CM | POA: Diagnosis not present

## 2018-04-05 DIAGNOSIS — Z90710 Acquired absence of both cervix and uterus: Secondary | ICD-10-CM | POA: Diagnosis not present

## 2018-04-05 DIAGNOSIS — G4700 Insomnia, unspecified: Secondary | ICD-10-CM | POA: Diagnosis not present

## 2018-04-05 DIAGNOSIS — F329 Major depressive disorder, single episode, unspecified: Secondary | ICD-10-CM | POA: Insufficient documentation

## 2018-04-05 DIAGNOSIS — K219 Gastro-esophageal reflux disease without esophagitis: Secondary | ICD-10-CM | POA: Diagnosis not present

## 2018-04-05 DIAGNOSIS — Z825 Family history of asthma and other chronic lower respiratory diseases: Secondary | ICD-10-CM | POA: Diagnosis not present

## 2018-04-05 DIAGNOSIS — Z9049 Acquired absence of other specified parts of digestive tract: Secondary | ICD-10-CM

## 2018-04-05 DIAGNOSIS — F10120 Alcohol abuse with intoxication, uncomplicated: Secondary | ICD-10-CM | POA: Insufficient documentation

## 2018-04-05 DIAGNOSIS — R45851 Suicidal ideations: Secondary | ICD-10-CM

## 2018-04-05 DIAGNOSIS — F419 Anxiety disorder, unspecified: Secondary | ICD-10-CM | POA: Insufficient documentation

## 2018-04-05 DIAGNOSIS — F332 Major depressive disorder, recurrent severe without psychotic features: Secondary | ICD-10-CM | POA: Diagnosis not present

## 2018-04-05 DIAGNOSIS — Z833 Family history of diabetes mellitus: Secondary | ICD-10-CM | POA: Diagnosis not present

## 2018-04-05 DIAGNOSIS — F10929 Alcohol use, unspecified with intoxication, unspecified: Secondary | ICD-10-CM

## 2018-04-05 DIAGNOSIS — Z8249 Family history of ischemic heart disease and other diseases of the circulatory system: Secondary | ICD-10-CM | POA: Diagnosis not present

## 2018-04-05 DIAGNOSIS — Z635 Disruption of family by separation and divorce: Secondary | ICD-10-CM | POA: Diagnosis not present

## 2018-04-05 DIAGNOSIS — Z23 Encounter for immunization: Secondary | ICD-10-CM | POA: Diagnosis not present

## 2018-04-05 DIAGNOSIS — Z803 Family history of malignant neoplasm of breast: Secondary | ICD-10-CM | POA: Diagnosis not present

## 2018-04-05 LAB — COMPREHENSIVE METABOLIC PANEL
ALBUMIN: 4.6 g/dL (ref 3.5–5.0)
ALK PHOS: 71 U/L (ref 38–126)
ALT: 25 U/L (ref 0–44)
AST: 23 U/L (ref 15–41)
Anion gap: 10 (ref 5–15)
BUN: 5 mg/dL — AB (ref 6–20)
CALCIUM: 9.3 mg/dL (ref 8.9–10.3)
CO2: 21 mmol/L — AB (ref 22–32)
CREATININE: 0.89 mg/dL (ref 0.44–1.00)
Chloride: 109 mmol/L (ref 98–111)
GFR calc Af Amer: >60 mL/min (ref >60)
GFR calc non Af Amer: >60 mL/min (ref >60)
GLUCOSE: 93 mg/dL (ref 70–99)
Potassium: 3.3 mmol/L — ABNORMAL LOW (ref 3.5–5.1)
SODIUM: 140 mmol/L (ref 135–145)
Total Bilirubin: 0.4 mg/dL (ref 0.3–1.2)
Total Protein: 7.4 g/dL (ref 6.5–8.1)

## 2018-04-05 LAB — CBC
HEMATOCRIT: 41.7 % (ref 36.0–46.0)
HEMOGLOBIN: 13.8 g/dL (ref 12.0–15.0)
MCH: 30.9 pg (ref 26.0–34.0)
MCHC: 33.1 g/dL (ref 30.0–36.0)
MCV: 93.3 fL (ref 80.0–100.0)
NRBC: 0 % (ref 0.0–0.2)
PLATELETS: 251 10*3/uL (ref 150–400)
RBC: 4.47 MIL/uL (ref 3.87–5.11)
RDW: 12.9 % (ref 11.5–15.5)
WBC: 10.9 10*3/uL — ABNORMAL HIGH (ref 4.0–10.5)

## 2018-04-05 LAB — I-STAT BETA HCG BLOOD, ED (MC, WL, AP ONLY): I-stat hCG, quantitative: <5 m[IU]/mL (ref <5)

## 2018-04-05 LAB — ETHANOL: Alcohol, Ethyl (B): 125 mg/dL — ABNORMAL HIGH (ref <10)

## 2018-04-05 LAB — SALICYLATE LEVEL

## 2018-04-05 LAB — ACETAMINOPHEN LEVEL: Acetaminophen (Tylenol), Serum: <10 ug/mL — ABNORMAL LOW (ref 10–30)

## 2018-04-05 MED ORDER — DIPHENHYDRAMINE HCL 50 MG/ML IJ SOLN: 50.0000 mg | Freq: Once | INTRAMUSCULAR | Status: DC | PRN

## 2018-04-05 MED ORDER — LORAZEPAM 1 MG PO TABS
1.0000 mg | ORAL_TABLET | Freq: Once | ORAL | Status: AC
  Administered 2018-04-05: 1 mg via ORAL
  Filled 2018-04-05: qty 1

## 2018-04-05 MED ORDER — ZIPRASIDONE MESYLATE 20 MG IM SOLR: 20.0000 mg | Freq: Once | INTRAMUSCULAR | Status: DC | PRN

## 2018-04-05 MED ORDER — NICOTINE 21 MG/24HR TD PT24
21.0000 mg | MEDICATED_PATCH | Freq: Once | TRANSDERMAL | Status: DC
  Administered 2018-04-05: 21 mg via TRANSDERMAL
  Filled 2018-04-05: qty 1

## 2018-04-05 NOTE — ED Notes (Signed)
ED Provider at bedside. 

## 2018-04-05 NOTE — ED Provider Notes (Signed)
Emergency Department Provider Note   I have reviewed the triage vital signs and the nursing notes.   HISTORY  Chief Complaint V70.1   HPI [name] is a 47 y.o. female with PMH of anxiety, IBS, and UC's to the emergency department with concern for suicidal ideation.  Patient states she is been under significant emotional stress at home.  Today she has been feeling increasingly hopeless and began drinking.  She drinks 2 bottles of wine which is unusual for her.  She took her normal doses of home medications today.  According to her father at bedside she then made a call to her mother where she was crying and told her mother that she "just took a bunch of pills because I don't want to wake up."  She tells me that she did not intentionally overdose but did tell her mom that she did.  She states that she wakes up every morning and "screams at god that I am still here."  She denies forming a specific plan to harm herself and states that she has grandchildren that she looks after and is very committed to being around for them.  She feels that her suicidal thinking is more of a passive wish as opposed to a plan. She denies any homicidal ideation.  No auditory or visual hallucinations.  Past Medical History:  Diagnosis Date  . Anxiety   . Diverticula of intestine   . IBS (irritable bowel syndrome)   . Migraines   . Thyroid disease   . Ulcerative colitis   . Vertigo     Patient Active Problem List   Diagnosis Date Noted  . Colitis 11/06/2015  . Vomiting and diarrhea 11/06/2015  . GERD (gastroesophageal reflux disease) 11/06/2015  . Thrombocytopenia (HCC) 10/21/2015  . Hypokalemia 10/19/2015  . Hypothyroidism, adult 10/19/2015  . Hypovolemia 10/19/2015  . Enterocolitis 10/18/2015  . Headache in front of head 01/17/2013  . Hyperlipidemia 01/17/2013  . Chest pain 01/16/2013  . Tobacco abuse 01/16/2013  . Cellulitis of buttock, left 03/14/2012  . Fatty liver 09/01/2011  .  Anxiety disorder 09/01/2011  . Abdominal pain 09/01/2011  . Pancreatitis 08/30/2011  . Leukocytosis 08/30/2011    Past Surgical History:  Procedure Laterality Date  . ABDOMINAL HYSTERECTOMY    . APPENDECTOMY    . CHOLECYSTECTOMY    . ESOPHAGOGASTRODUODENOSCOPY N/A 10/23/2015   Procedure: ESOPHAGOGASTRODUODENOSCOPY (EGD);  Surgeon: Malissa Hippo, MD;  Location: [facility];  Service: Endoscopy;  Laterality: N/A;  . FLEXIBLE SIGMOIDOSCOPY N/A 11/07/2015   Procedure: FLEXIBLE SIGMOIDOSCOPY;  Surgeon: Malissa Hippo, MD;  Location: [facility];  Service: Endoscopy;  Laterality: N/A;  . KNEE ARTHROSCOPY      Allergies Morphine and related  Family History  Problem Relation Age of Onset  . Diabetes Mother   . COPD Mother   . Heart disease Mother   . Breast cancer Paternal Grandmother   . Lung cancer Paternal Grandfather     Social History Social History   Tobacco Use  . Smoking status: Current Every Day Smoker    Packs/day: 1.00    Years: 20.00    Pack years: 20.00    Types: Cigarettes  . Smokeless tobacco: Never Used  Substance Use Topics  . Alcohol use: No    Alcohol/week: 0.0 standard drinks    Comment: socially  . Drug use: No    Review of Systems  Constitutional: No fever/chills Eyes: No visual changes. ENT: No sore throat. Cardiovascular: Denies chest  pain. Respiratory: Denies shortness of breath. Gastrointestinal: No abdominal pain.  No nausea, no vomiting.  No diarrhea.  No constipation. Genitourinary: Negative for dysuria. Musculoskeletal: Negative for back pain. Skin: Negative for rash. Neurological: Negative for headaches, focal weakness or numbness. Psychiatric:Positive passive suicidal ideation.   10-point ROS otherwise negative.  ____________________________________________   PHYSICAL EXAM:  VITAL SIGNS: ED Triage Vitals  Enc Vitals Group     BP 04/05/18 2127 (!) 139/117     Pulse Rate 04/05/18 2127 (!) 119     Resp 04/05/18 2127 16      Temp 04/05/18 2127 98.2 F (36.8 C)     Temp Source 04/05/18 2127 Oral     SpO2 04/05/18 2127 98 %     Pain Score 04/05/18 2132 7   Constitutional: Alert and oriented.  She has belligerent.  Eyes: Conjunctivae are normal.  Head: Atraumatic. Nose: No congestion/rhinnorhea. Mouth/Throat: Mucous membranes are moist.  Oropharynx non-erythematous. Neck: No stridor.  Cardiovascular: Normal rate, regular rhythm. Good peripheral circulation. Grossly normal heart sounds.   Respiratory: Normal respiratory effort.  No retractions. Lungs CTAB. Gastrointestinal: Soft and nontender. No distention.  Musculoskeletal: No lower extremity tenderness nor edema. No gross deformities of extremities. Neurologic:  Normal speech and language. No gross focal neurologic deficits are appreciated.  Skin:  Skin is warm, dry and intact. No rash noted. Psychiatric: Mood and affect are agitated. She is tearful at times.   ____________________________________________   LABS (all labs ordered are listed, but only abnormal results are displayed)  Labs Reviewed  COMPREHENSIVE METABOLIC PANEL - Abnormal; Notable for the following components:      Result Value   Potassium 3.3 (*)    CO2 21 (*)    BUN 5 (*)    All other components within normal limits  ETHANOL - Abnormal; Notable for the following components:   Alcohol, Ethyl (B) 125 (*)    All other components within normal limits  ACETAMINOPHEN LEVEL - Abnormal; Notable for the following components:   Acetaminophen (Tylenol), Serum <10 (*)    All other components within normal limits  CBC - Abnormal; Notable for the following components:   WBC 10.9 (*)    All other components within normal limits  SALICYLATE LEVEL  RAPID URINE DRUG SCREEN, HOSP PERFORMED  TSH  I-STAT BETA HCG BLOOD, ED ([facility], [facility], [facility] ONLY)   ____________________________________________  RADIOLOGY  None ____________________________________________   PROCEDURES  Procedure(s)  performed:   Procedures  None ____________________________________________   INITIAL IMPRESSION / ASSESSMENT AND PLAN / ED COURSE  Pertinent labs & imaging results that were available during my care of the patient were reviewed by me and considered in my medical decision making (see chart for details).  Patient presents to the emergency department for evaluation of suicidal ideation and making a claim to family that she overdosed on pills.  She tells me that she did not do this and simply told them this because she is feeling overwhelmed and hopeless.  She is having passive suicidal ideation at this time which she openly endorses to me.  She does intimately become more agitated to ER able to verbally redirect her.  She did make several insinuations that she needs to leave which I would not feel comfortable with given her claims of suicidality.  I did file IVC papers to hold her in the emergency department but she did not require restraint. Patient given Ativan PO for agitation.   12:11 AM She more calm and cooperative.  Alcohol level is elevated.  Remaining labs reviewed with no acute findings.  I have placed TTS consult and ordered home medications.  TSH added and will follow but very low suspicion for hyperthyroid.  ____________________________________________  FINAL CLINICAL IMPRESSION(S) / ED DIAGNOSES  Final diagnoses:  Suicidal ideation  Alcoholic intoxication with complication (HCC)     MEDICATIONS GIVEN DURING THIS VISIT:  Medications  nicotine (NICODERM CQ - dosed in mg/24 hours) patch 21 mg (21 mg Transdermal Patch Applied 04/05/18 2307)  ziprasidone (GEODON) injection 20 mg (has no administration in time range)  diphenhydrAMINE (BENADRYL) injection 50 mg (has no administration in time range)  ondansetron (ZOFRAN) tablet 4 mg (has no administration in time range)  ibuprofen (ADVIL,MOTRIN) tablet 600 mg (has no administration in time range)  diazepam (VALIUM) tablet 10 mg  (has no administration in time range)  FLUoxetine (PROZAC) capsule 40 mg (has no administration in time range)  levothyroxine (SYNTHROID, LEVOTHROID) tablet 200 mcg (has no administration in time range)  linaclotide (LINZESS) capsule 72 mcg (has no administration in time range)  pantoprazole (PROTONIX) EC tablet 40 mg (has no administration in time range)  QUEtiapine (SEROQUEL) tablet 25 mg (has no administration in time range)  LORazepam (ATIVAN) tablet 1 mg (1 mg Oral Given 04/05/18 2224)    Note:  This document was prepared using Dragon voice recognition software and may include unintentional dictation errors.  Alona Bene, MD Emergency Medicine    Katreena Schupp, Arlyss Repress, MD 04/06/18 [phone]

## 2018-04-05 NOTE — ED Notes (Addendum)
Pt stating to her father she is not crystal. Father asked where is crystal, pt stated "crystal is in a hole and cannot get out".  Pt crying uncontrollably , and stating she just want to go home.

## 2018-04-05 NOTE — ED Triage Notes (Signed)
Pt states she feels "sad." Pt states she does not care if she lives or dies. Pt has a plan to overdose on insulin or pills. Pts daughter is a drug addicted. Pt had to take custody of her daughters children and ever since then she has been stressed out. Pt has also recently separated from her husband because "he was an alcoholic."

## 2018-04-05 NOTE — ED Notes (Signed)
Per Father, pt called her mother and stated that she had taken a bunch of pills and did not want to live anymore. Pt states she has only been drinking wine.

## 2018-04-05 NOTE — ED Notes (Signed)
Family at bedside. Father

## 2018-04-06 ENCOUNTER — Inpatient Hospital Stay (HOSPITAL_COMMUNITY)
Admission: AD | Admit: 2018-04-06 | Discharge: 2018-04-09 | DRG: 885 | Disposition: A | Discharge status: Home / Self Care | Payer: BLUE CROSS/BLUE SHIELD | Source: Intra-hospital | Attending: Psychiatry | Admitting: Psychiatry

## 2018-04-06 ENCOUNTER — Encounter (HOSPITAL_COMMUNITY): Payer: Self-pay | Admitting: *Deleted

## 2018-04-06 DIAGNOSIS — Z803 Family history of malignant neoplasm of breast: Secondary | ICD-10-CM

## 2018-04-06 DIAGNOSIS — F419 Anxiety disorder, unspecified: Secondary | ICD-10-CM

## 2018-04-06 DIAGNOSIS — E039 Hypothyroidism, unspecified: Secondary | ICD-10-CM | POA: Diagnosis present

## 2018-04-06 DIAGNOSIS — F332 Major depressive disorder, recurrent severe without psychotic features: Secondary | ICD-10-CM | POA: Diagnosis not present

## 2018-04-06 DIAGNOSIS — Z8249 Family history of ischemic heart disease and other diseases of the circulatory system: Secondary | ICD-10-CM

## 2018-04-06 DIAGNOSIS — R45851 Suicidal ideations: Secondary | ICD-10-CM | POA: Diagnosis not present

## 2018-04-06 DIAGNOSIS — Z79899 Other long term (current) drug therapy: Secondary | ICD-10-CM

## 2018-04-06 DIAGNOSIS — Z801 Family history of malignant neoplasm of trachea, bronchus and lung: Secondary | ICD-10-CM | POA: Diagnosis not present

## 2018-04-06 DIAGNOSIS — Z885 Allergy status to narcotic agent status: Secondary | ICD-10-CM

## 2018-04-06 DIAGNOSIS — Z90710 Acquired absence of both cervix and uterus: Secondary | ICD-10-CM | POA: Diagnosis not present

## 2018-04-06 DIAGNOSIS — Z9049 Acquired absence of other specified parts of digestive tract: Secondary | ICD-10-CM | POA: Diagnosis not present

## 2018-04-06 DIAGNOSIS — K219 Gastro-esophageal reflux disease without esophagitis: Secondary | ICD-10-CM | POA: Diagnosis present

## 2018-04-06 DIAGNOSIS — Z635 Disruption of family by separation and divorce: Secondary | ICD-10-CM

## 2018-04-06 DIAGNOSIS — Y906 Blood alcohol level of 120-199 mg/100 ml: Secondary | ICD-10-CM | POA: Diagnosis not present

## 2018-04-06 DIAGNOSIS — G4700 Insomnia, unspecified: Secondary | ICD-10-CM | POA: Diagnosis not present

## 2018-04-06 DIAGNOSIS — Z23 Encounter for immunization: Secondary | ICD-10-CM

## 2018-04-06 DIAGNOSIS — Z79890 Hormone replacement therapy (postmenopausal): Secondary | ICD-10-CM | POA: Diagnosis not present

## 2018-04-06 DIAGNOSIS — Z825 Family history of asthma and other chronic lower respiratory diseases: Secondary | ICD-10-CM | POA: Diagnosis not present

## 2018-04-06 DIAGNOSIS — Z833 Family history of diabetes mellitus: Secondary | ICD-10-CM

## 2018-04-06 DIAGNOSIS — F17210 Nicotine dependence, cigarettes, uncomplicated: Secondary | ICD-10-CM | POA: Diagnosis present

## 2018-04-06 DIAGNOSIS — F1099 Alcohol use, unspecified with unspecified alcohol-induced disorder: Secondary | ICD-10-CM

## 2018-04-06 LAB — RAPID URINE DRUG SCREEN, HOSP PERFORMED
Amphetamines: NOT DETECTED
BARBITURATES: NOT DETECTED
Benzodiazepines: POSITIVE — AB
COCAINE: NOT DETECTED
Opiates: NOT DETECTED
Tetrahydrocannabinol: NOT DETECTED

## 2018-04-06 LAB — TSH: TSH: 0.216 u[IU]/mL — ABNORMAL LOW (ref 0.350–4.500)

## 2018-04-06 MED ORDER — ACETAMINOPHEN 325 MG PO TABS: 650.0000 mg | ORAL_TABLET | Freq: Four times a day (QID) | ORAL | Status: DC | PRN

## 2018-04-06 MED ORDER — LINACLOTIDE 72 MCG PO CAPS
72.0000 ug | ORAL_CAPSULE | Freq: Every day | ORAL | Status: DC
  Filled 2018-04-06 (×2): qty 1

## 2018-04-06 MED ORDER — PANTOPRAZOLE SODIUM 40 MG PO TBEC
40.0000 mg | DELAYED_RELEASE_TABLET | Freq: Two times a day (BID) | ORAL | Status: DC
  Administered 2018-04-06: 40 mg via ORAL
  Filled 2018-04-06: qty 1

## 2018-04-06 MED ORDER — MAGNESIUM HYDROXIDE 400 MG/5ML PO SUSP: 30.0000 mL | Freq: Every day | ORAL | Status: DC | PRN

## 2018-04-06 MED ORDER — TRAZODONE HCL 50 MG PO TABS
50.0000 mg | ORAL_TABLET | Freq: Every evening | ORAL | Status: DC | PRN
  Filled 2018-04-06 (×2): qty 1

## 2018-04-06 MED ORDER — ONDANSETRON HCL 4 MG PO TABS: 4.0000 mg | ORAL_TABLET | Freq: Three times a day (TID) | ORAL | Status: DC | PRN

## 2018-04-06 MED ORDER — QUETIAPINE FUMARATE 25 MG PO TABS
25.0000 mg | ORAL_TABLET | Freq: Every day | ORAL | Status: DC
  Filled 2018-04-06 (×2): qty 1

## 2018-04-06 MED ORDER — MIRTAZAPINE 7.5 MG PO TABS
7.5000 mg | ORAL_TABLET | Freq: Every day | ORAL | Status: DC
  Filled 2018-04-06: qty 1

## 2018-04-06 MED ORDER — MIRTAZAPINE 7.5 MG PO TABS
7.5000 mg | ORAL_TABLET | Freq: Every day | ORAL | Status: DC
  Administered 2018-04-06 – 2018-04-08 (×3): 7.5 mg via ORAL
  Filled 2018-04-06 (×6): qty 1

## 2018-04-06 MED ORDER — INFLUENZA VAC SPLIT QUAD 0.5 ML IM SUSY
0.5000 mL | PREFILLED_SYRINGE | INTRAMUSCULAR | Status: AC
  Administered 2018-04-07: 0.5 mL via INTRAMUSCULAR
  Filled 2018-04-06: qty 0.5

## 2018-04-06 MED ORDER — PANTOPRAZOLE SODIUM 40 MG PO TBEC
40.0000 mg | DELAYED_RELEASE_TABLET | Freq: Two times a day (BID) | ORAL | Status: DC
  Administered 2018-04-06 – 2018-04-09 (×6): 40 mg via ORAL
  Filled 2018-04-06 (×10): qty 1

## 2018-04-06 MED ORDER — QUETIAPINE FUMARATE 25 MG PO TABS
25.0000 mg | ORAL_TABLET | Freq: Every day | ORAL | Status: DC
  Administered 2018-04-06: 25 mg via ORAL
  Filled 2018-04-06: qty 1

## 2018-04-06 MED ORDER — LEVOTHYROXINE SODIUM 50 MCG PO TABS
200.0000 ug | ORAL_TABLET | Freq: Every day | ORAL | Status: DC
  Filled 2018-04-06: qty 4

## 2018-04-06 MED ORDER — LINACLOTIDE 72 MCG PO CAPS: 72.0000 ug | ORAL_CAPSULE | Freq: Every day | ORAL | Status: DC

## 2018-04-06 MED ORDER — FLUOXETINE HCL 20 MG PO CAPS: 40.0000 mg | ORAL_CAPSULE | Freq: Every day | ORAL | Status: DC

## 2018-04-06 MED ORDER — FLUOXETINE HCL 20 MG PO CAPS
40.0000 mg | ORAL_CAPSULE | Freq: Every day | ORAL | Status: DC
  Administered 2018-04-06: 40 mg via ORAL
  Filled 2018-04-06 (×4): qty 2

## 2018-04-06 MED ORDER — ALUM & MAG HYDROXIDE-SIMETH 200-200-20 MG/5ML PO SUSP: 30.0000 mL | ORAL | Status: DC | PRN

## 2018-04-06 MED ORDER — IBUPROFEN 600 MG PO TABS
600.0000 mg | ORAL_TABLET | Freq: Three times a day (TID) | ORAL | Status: DC | PRN
  Administered 2018-04-06 – 2018-04-09 (×4): 600 mg via ORAL
  Filled 2018-04-06 (×4): qty 1

## 2018-04-06 MED ORDER — LEVOTHYROXINE SODIUM 200 MCG PO TABS
200.0000 ug | ORAL_TABLET | Freq: Every day | ORAL | Status: DC
  Administered 2018-04-07 – 2018-04-09 (×3): 200 ug via ORAL
  Filled 2018-04-06 (×5): qty 1

## 2018-04-06 MED ORDER — IBUPROFEN 400 MG PO TABS: 600.0000 mg | ORAL_TABLET | Freq: Three times a day (TID) | ORAL | Status: DC | PRN

## 2018-04-06 MED ORDER — DIAZEPAM 5 MG PO TABS
10.0000 mg | ORAL_TABLET | Freq: Four times a day (QID) | ORAL | Status: DC | PRN
  Administered 2018-04-06: 10 mg via ORAL
  Filled 2018-04-06: qty 2

## 2018-04-06 MED ORDER — VENLAFAXINE HCL ER 37.5 MG PO CP24
37.5000 mg | ORAL_CAPSULE | Freq: Every day | ORAL | Status: DC
  Administered 2018-04-07: 37.5 mg via ORAL
  Filled 2018-04-06 (×3): qty 1

## 2018-04-06 MED ORDER — NICOTINE 21 MG/24HR TD PT24
21.0000 mg | MEDICATED_PATCH | Freq: Every day | TRANSDERMAL | Status: DC
  Administered 2018-04-07 – 2018-04-09 (×3): 21 mg via TRANSDERMAL
  Filled 2018-04-06 (×5): qty 1

## 2018-04-06 MED ORDER — DIAZEPAM 5 MG PO TABS: 10.0000 mg | ORAL_TABLET | Freq: Three times a day (TID) | ORAL | Status: DC | PRN

## 2018-04-06 MED ORDER — PNEUMOCOCCAL VAC POLYVALENT 25 MCG/0.5ML IJ INJ
0.5000 mL | INJECTION | INTRAMUSCULAR | Status: AC
  Administered 2018-04-07: 0.5 mL via INTRAMUSCULAR

## 2018-04-06 MED ORDER — DIAZEPAM 5 MG PO TABS
5.0000 mg | ORAL_TABLET | Freq: Three times a day (TID) | ORAL | Status: DC | PRN
  Administered 2018-04-06: 5 mg via ORAL
  Filled 2018-04-06: qty 1
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
## 2018-04-06 NOTE — ED Notes (Signed)
[facility] here to pick up pt.

## 2018-04-06 NOTE — [facility] Suicide Risk Assessment (Signed)
[facility] Admission Suicide Risk Assessment   Nursing information obtained from:   Patient and chart Demographic factors:   47 year old female, separated, 3 adult children Current Mental Status:   See below Loss Factors:   Separation, feeling rejected by a friend, unemployment, helping raise grandchildren Historical Factors:   Long history of depression Risk Reduction Factors:   Resilience, sense of responsibility to family  Total Time spent with patient: 45 minutes Principal Problem: MDD Diagnosis:   Patient Active Problem List   Diagnosis Date Noted  . Severe recurrent major depression without psychotic features ([facility]) [F33.2] 04/06/2018  . Colitis [K52.9] 11/06/2015  . Vomiting and diarrhea [R11.10, R19.7] 11/06/2015  . GERD (gastroesophageal reflux disease) [K21.9] 11/06/2015  . Thrombocytopenia ([facility]) [D69.6] 10/21/2015  . Hypokalemia [E87.6] 10/19/2015  . Hypothyroidism, adult [E03.9] 10/19/2015  . Hypovolemia [E86.1] 10/19/2015  . Enterocolitis [K52.9] 10/18/2015  . Headache in front of head [R51] 01/17/2013  . Hyperlipidemia [E78.5] 01/17/2013  . Chest pain [R07.9] 01/16/2013  . Tobacco abuse [Z72.0] 01/16/2013  . Cellulitis of buttock, left [L03.317] 03/14/2012  . Fatty liver [K76.0] 09/01/2011  . Anxiety disorder [F41.9] 09/01/2011  . Abdominal pain [789.0] 09/01/2011  . Pancreatitis [K85.90] 08/30/2011  . Leukocytosis [D72.829] 08/30/2011   Subjective Data:   Continued Clinical Symptoms:    The "Alcohol Use Disorders Identification Test", Guidelines for Use in Primary Care, Second Edition.  World Science writer [facility]). Score between 0-7:  no or low risk or alcohol related problems. Score between 8-15:  moderate risk of alcohol related problems. Score between 16-19:  high risk of alcohol related problems. Score 20 or above:  warrants further diagnostic evaluation for alcohol dependence and treatment.   CLINICAL FACTORS:  47 year old female presents for worsening  depression, neurovegetative symptoms of depression, suicidal thoughts of overdosing, increased anxiety.  Admission blood alcohol level 125, denies any pattern of alcohol abuse or dependence, reports drinks only occasionally.    Psychiatric Specialty Exam: Physical Exam  ROS  Blood pressure 130/76, pulse 99, temperature 98.1 F (36.7 C), temperature source Oral, resp. rate 18, height 5\' 6"  (1.676 m), weight 76.2 kg, SpO2 100 %.Body mass index is 27.12 kg/m.   see admit note MSE  COGNITIVE FEATURES THAT CONTRIBUTE TO RISK:  Closed-mindedness and Loss of executive function    SUICIDE RISK:   Moderate:  Frequent suicidal ideation with limited intensity, and duration, some specificity in terms of plans, no associated intent, good self-control, limited dysphoria/symptomatology, some risk factors present, and identifiable protective factors, including available and accessible social support.  PLAN OF CARE: Patient will be admitted to inpatient psychiatric unit for stabilization and safety. Will provide and encourage milieu participation. Provide medication management and maked adjustments as needed.  Will follow daily.    I certify that inpatient services furnished can reasonably be expected to improve the patient's condition.   Craige Cotta, MD 04/06/2018, 5:48 PM

## 2018-04-06 NOTE — Progress Notes (Signed)
Adult Psychoeducational Group Note  Date:  04/06/2018 Time:  9:34 PM  Group Topic/Focus:  Wrap-Up Group:   The focus of this group is to help patients review their daily goal of treatment and discuss progress on daily workbooks.  Participation Level:  Minimal  Participation Quality:  Appropriate  Affect:  Flat  Cognitive:  Appropriate  Insight: Appropriate  Engagement in Group:  Engaged  Modes of Intervention:  Socialization and Support  Additional Comments:  Patient attended and participated in group tonight. She reported that the day started out rough, but got better as she talked to people. She made herself come to group.  Lita Mains [facility] 04/06/2018, 9:34 PM

## 2018-04-06 NOTE — ED Notes (Signed)
Pt's parents information:  Gala Lewandowsky (mom) Cell: [phone]  Paulita Fujita (dad) Cell: [phone]  Home: [phone]

## 2018-04-06 NOTE — ED Notes (Signed)
Pt given meal

## 2018-04-06 NOTE — ED Notes (Signed)
Disposition: Nira Conn, NP, patient meets inpatient criteria. TTS to secure placement. Doctor and RN informed of disposition.

## 2018-04-06 NOTE — ED Notes (Signed)
Pt given breakfast tray

## 2018-04-06 NOTE — ED Notes (Signed)
Pt leaving with [facility]
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
## 2018-04-06 NOTE — ED Notes (Signed)
Have notified secretary to call [facility]

## 2018-04-06 NOTE — BH Assessment (Signed)
Tele Assessment Note   Patient Name: [name] MRN: [id number] Referring Physician: Dr. Laverta [place] Location of [facility]: [facility] Bed: [facility] Location of Provider: [place] is an 47 y.o. female with SI with plan to overdose. Patient reported being under a lot emotional stress at home. Patient reported "I had enough, cooking, cleaning, homework, 5 other people in the house not doing anything, I am tired, I called mom and told her I need help". Patient reported that her son and his girlfriend brought her to ED and her parents met them at the ED. Patient reports onset of SI was 2 weeks. Patient reported areas of emotional stress includes, taking care of her 3 grandchildren, as their mother, her daughter is a drug addict. Also, patient stated, " I am taking care of my ex husband, watching him die with stage 5 renal failure and recent heart attacks".  Patient reports feeling increasingly hopeless and began drinking.  Patient reports drinking 2 bottles of wine which is unusual for her. Patient reports taking her normal doses of home medications today. Patient reports increased depressive symptoms. Patient denied HI and psychosis. Patient cooperative during assessment.  Per ED NOTE: According to her father at bedside she then made a call to her mother where she was crying and told her mother that she "just took a bunch of pills because I don't want to wake up."  She tells me that she did not intentionally overdose but did tell her mom that she did.  She states that she wakes up every morning and "screams at god that I am still here."  She denies forming a specific plan to harm herself and states that she has grandchildren that she looks after and is very committed to being around for them.  She feels that her suicidal thinking is more of a passive wish as opposed to a plan. She denies any homicidal ideation.  No auditory or visual hallucinations.  UDS +benzos BAL  125  Disposition: Lindon Romp, NP, patient meets inpatient criteria. TTS to secure placement. Doctor and RN informed of disposition.   Diagnosis: Major Depressive disorder and Anxiety disorder.  Past Medical History:  Past Medical History:  Diagnosis Date  . Anxiety   . Diverticula of intestine   . IBS (irritable bowel syndrome)   . Migraines   . Thyroid disease   . Ulcerative colitis   . Vertigo     Past Surgical History:  Procedure Laterality Date  . ABDOMINAL HYSTERECTOMY    . APPENDECTOMY    . CHOLECYSTECTOMY    . ESOPHAGOGASTRODUODENOSCOPY N/A 10/23/2015   Procedure: ESOPHAGOGASTRODUODENOSCOPY (EGD);  Surgeon: Rogene Houston, MD;  [facility];  Service: Endoscopy;  Laterality: N/A;  . FLEXIBLE SIGMOIDOSCOPY N/A 11/07/2015   Procedure: FLEXIBLE SIGMOIDOSCOPY;  Surgeon: Rogene Houston, MD;  Location: [facility];  Service: Endoscopy;  Laterality: N/A;  . KNEE ARTHROSCOPY      Family History:  Family History  Problem Relation Age of Onset  . Diabetes Mother   . COPD Mother   . Heart disease Mother   . Breast cancer Paternal Grandmother   . Lung cancer Paternal Grandfather     Social History:  reports that she has been smoking cigarettes. She has a 20.00 pack-year smoking history. She has never used smokeless tobacco. She reports that she does not drink alcohol or use drugs.  Additional Social History:  Alcohol / Drug Use Pain Medications: see MAR Prescriptions: see MAR  Over the Counter: see MAR  CIWA: CIWA-Ar BP: (!) 139/117 Pulse Rate: (!) 119 COWS:    Allergies:  Allergies  Allergen Reactions  . Morphine And Related Itching and Rash    Home Medications:  (Not in a hospital admission)  OB/GYN Status:  No LMP recorded. Patient has had a hysterectomy.  General Assessment Data Location of Assessment: [facility] TTS Assessment: In system Is this a Tele or Face-to-Face Assessment?: Tele Assessment Is this an Initial Assessment or a  Re-assessment for this encounter?: Initial Assessment Patient Accompanied by:: (alone) Language Other than English: No Living Arrangements: (family home) What gender do you identify as?: Female Marital status: Separated Pregnancy Status: Unknown Living Arrangements: Spouse/significant other, Children(husband (seperated) and grandchildren) Can pt return to current living arrangement?: Yes Admission Status: Voluntary Is patient capable of signing voluntary admission?: Yes Referral Source: Self/Family/Friend     Crisis Care Plan Living Arrangements: Spouse/significant other, Children(husband (seperated) and grandchildren) Legal Guardian: Other:(self) Name of Psychiatrist: (none) Name of Therapist: (none)  Education Status Is patient currently in school?: No Is the patient employed, unemployed or receiving disability?: Unemployed  Risk to self with the past 6 months Suicidal Ideation: Yes-Currently Present Has patient been a risk to self within the past 6 months prior to admission? : No Suicidal Intent: Yes-Currently Present Has patient had any suicidal intent within the past 6 months prior to admission? : No Is patient at risk for suicide?: Yes Suicidal Plan?: Yes-Currently Present Has patient had any suicidal plan within the past 6 months prior to admission? : No Specify Current Suicidal Plan: (plan to overdose on pills) Access to Means: Yes Specify Access to Suicidal Means: (pills in the home) What has been your use of drugs/alcohol within the last 12 months?: (wine occassionally) Previous Attempts/Gestures: No How many times?: (0) Other Self Harm Risks: (denied) Triggers for Past Attempts: (overwhelmed) Intentional Self Injurious Behavior: None Family Suicide History: No Recent stressful life event(s): (caring for dying husband and 3 grandchildren) Persecutory voices/beliefs?: No Depression: Yes Depression Symptoms: Insomnia, Isolating, Fatigue, Loss of interest in usual  pleasures, Feeling worthless/self pity Substance abuse history and/or treatment for substance abuse?: (wine occassionally)  Risk to Others within the past 6 months Homicidal Ideation: No Does patient have any lifetime risk of violence toward others beyond the six months prior to admission? : No Thoughts of Harm to Others: No Current Homicidal Intent: No Current Homicidal Plan: No Access to Homicidal Means: No History of harm to others?: No Assessment of Violence: None Noted Does patient have access to weapons?: No Criminal Charges Pending?: No Does patient have a court date: No Is patient on probation?: No  Psychosis Hallucinations: None noted Delusions: None noted  Mental Status Report Appearance/Hygiene: Unremarkable Eye Contact: Good Motor Activity: Unremarkable Speech: Logical/coherent Level of Consciousness: Alert Mood: Depressed, Anxious, Despair, Helpless, Sad Affect: Anxious, Depressed, Sad Anxiety Level: Moderate Thought Processes: Coherent Judgement: Unimpaired Orientation: Place, Person, Time, Situation Obsessive Compulsive Thoughts/Behaviors: None  Cognitive Functioning Concentration: Normal Memory: Recent Intact, Remote Intact Is patient IDD: No Insight: Fair Impulse Control: Fair Appetite: Poor(fluctuates) Have you had any weight changes? : No Change Sleep: Decreased Total Hours of Sleep: (3) Vegetative Symptoms: Staying in bed, Decreased grooming  ADLScreening [facility] Assessment Services) Patient's cognitive ability adequate to safely complete daily activities?: Yes Patient able to express need for assistance with ADLs?: Yes Independently performs ADLs?: Yes (appropriate for developmental age)  Prior Inpatient Therapy Prior Inpatient Therapy: No  Prior Outpatient Therapy Prior Outpatient  Therapy: No Does patient have an ACCT team?: No Does patient have Intensive In-House Services?  : No Does patient have [facility] services? : No Does patient have  P4CC services?: No  ADL Screening (condition at time of admission) Patient's cognitive ability adequate to safely complete daily activities?: Yes Patient able to express need for assistance with ADLs?: Yes Independently performs ADLs?: Yes (appropriate for developmental age)             Regulatory affairs officer (For Healthcare) Does Patient Have a Medical Advance Directive?: Yes Type of Advance Directive: Healthcare Power of Attorney, Living will          Disposition:  Disposition Initial Assessment Completed for this Encounter: Yes  Lindon Romp, NP, patient meets inpatient criteria. TTS to secure placement. Doctor and RN informed of disposition.   This service was provided via telemedicine using a 2-way, interactive audio and video technology.  Names of all persons participating in this telemedicine service and their role in this encounter. Name: [name] Role: patient  Name: Kirtland Bouchard, [facility] Role: TTS Clinician  Name:  Role:   Name:  Role:     Venora Maples 04/06/2018 1:25 AM

## 2018-04-06 NOTE — Therapy (Signed)
Occupational Therapy Group Note  Date:  04/06/2018 Time:  3:09 PM  Group Topic/Focus:  Self Esteem Action Plan:   The focus of this group is to help patients create a plan to continue to build self-esteem after discharge.  Participation Level:  Active  Participation Quality:  Appropriate  Affect:  Depressed and Flat  Cognitive:  Appropriate  Insight: Improving  Engagement in Group:  Engaged  Modes of Intervention:  Activity, Discussion, Education and Socialization  Additional Comments:    S: "I describe myself as open minded"  O: OT tx with focus on self esteem building this date. Education given on definition of self esteem, with both causes of low and high self esteem identified. Activity given for pt to identify a positive/aspiring trait for each letter of the alphabet. Pt to work with peers to help complete activity and build positive thinking.   A: Pt presents to group with flat/depressed affect, engaged and participatory throughout session. Pt entered group late, did not fully engage in discussion on negative vs positive factors of self esteem. A-z activity completed independently, pt willing to share with others. Pt appropriately brainstorming with other peers for support. Noted improved affect after completion of activity.   P: Education given on self esteem and how to improve this date. Handouts and activities given to help facilitate skills when reintegrating into community.   Dalphine Handing, MSOT, OTR/L Behavioral Health OT/ Acute Relief OT  Dalphine Handing 04/06/2018, 3:09 PM

## 2018-04-06 NOTE — ED Notes (Signed)
IVC pt given to pt via sheriff. Pt changed into scrubs

## 2018-04-06 NOTE — Tx Team (Signed)
Interdisciplinary Treatment and Diagnostic Plan Update  04/06/2018 Time of Session: [place] MRN: [id number]  Principal Diagnosis: <principal problem not specified>  Secondary Diagnoses: Active Problems:   Severe recurrent major depression without psychotic features (HCC)   Current Medications:  Current Facility-Administered Medications  Medication Dose Route Frequency Provider Last Rate Last Dose  . acetaminophen (TYLENOL) tablet 650 mg  650 mg Oral Q6H PRN Lindon Romp A, NP      . alum & mag hydroxide-simeth (MAALOX/MYLANTA) 200-200-20 MG/5ML suspension 30 mL  30 mL Oral Q4H PRN Lindon Romp A, NP      . diazepam (VALIUM) tablet 10 mg  10 mg Oral Q6H PRN Lindon Romp A, NP   10 mg at 04/06/18 1341  . FLUoxetine (PROZAC) capsule 40 mg  40 mg Oral Daily Lindon Romp A, NP   40 mg at 04/06/18 1341  . ibuprofen (ADVIL,MOTRIN) tablet 600 mg  600 mg Oral Q8H PRN Rozetta Nunnery, NP      . Derrill Memo ON 04/07/2018] levothyroxine (SYNTHROID, LEVOTHROID) tablet 200 mcg  200 mcg Oral QAC breakfast Rozetta Nunnery, NP      . Derrill Memo ON 04/07/2018] linaclotide (LINZESS) capsule 72 mcg  72 mcg Oral QAC breakfast Lindon Romp A, NP      . magnesium hydroxide (MILK OF MAGNESIA) suspension 30 mL  30 mL Oral Daily PRN Lindon Romp A, NP      . ondansetron (ZOFRAN) tablet 4 mg  4 mg Oral Q8H PRN Lindon Romp A, NP      . pantoprazole (PROTONIX) EC tablet 40 mg  40 mg Oral BID Lindon Romp A, NP      . QUEtiapine (SEROQUEL) tablet 25 mg  25 mg Oral QHS Lindon Romp A, NP       PTA Medications: Medications Prior to Admission  Medication Sig Dispense Refill Last Dose  . diazepam (VALIUM) 10 MG tablet Take 10 mg by mouth 3 (three) times daily.   4 04/05/2018 at Unknown time  . FLUoxetine (PROZAC) 40 MG capsule Take 40 mg by mouth 2 (two) times daily in the am and at bedtime..   11 04/05/2018 at Unknown time  . lamoTRIgine (LAMICTAL) 25 MG tablet Take 25 mg by mouth at bedtime.   Past Week at  Unknown time  . levothyroxine (SYNTHROID, LEVOTHROID) 200 MCG tablet Take 200 mcg by mouth daily before breakfast.   11 04/05/2018 at Unknown time    Patient Stressors:    Patient Strengths:    Treatment Modalities: Medication Management, Group therapy, Case management,  1 to 1 session with clinician, Psychoeducation, Recreational therapy.   Physician Treatment Plan for Primary Diagnosis: <principal problem not specified> Long Term Goal(s):     Short Term Goals:    Medication Management: Evaluate patient's response, side effects, and tolerance of medication regimen.  Therapeutic Interventions: 1 to 1 sessions, Unit Group sessions and Medication administration.  Evaluation of Outcomes: Not Met  Physician Treatment Plan for Secondary Diagnosis: Active Problems:   Severe recurrent major depression without psychotic features ([place])  Long Term Goal(s):     Short Term Goals:       Medication Management: Evaluate patient's response, side effects, and tolerance of medication regimen.  Therapeutic Interventions: 1 to 1 sessions, Unit Group sessions and Medication administration.  Evaluation of Outcomes: Not Met   RN Treatment Plan for Primary Diagnosis: <principal problem not specified> Long Term Goal(s): Knowledge of disease and therapeutic regimen to maintain health will improve  Short  Term Goals: Ability to identify and develop effective coping behaviors will improve and Compliance with prescribed medications will improve  Medication Management: RN will administer medications as ordered by provider, will assess and evaluate patient's response and provide education to patient for prescribed medication. RN will report any adverse and/or side effects to prescribing provider.  Therapeutic Interventions: 1 on 1 counseling sessions, Psychoeducation, Medication administration, Evaluate responses to treatment, Monitor vital signs and CBGs as ordered, Perform/monitor CIWA, COWS, AIMS and  Fall Risk screenings as ordered, Perform wound care treatments as ordered.  Evaluation of Outcomes: Not Met   LCSW Treatment Plan for Primary Diagnosis: <principal problem not specified> Long Term Goal(s): Safe transition to appropriate next level of care at discharge, Engage patient in therapeutic group addressing interpersonal concerns.  Short Term Goals: Engage patient in aftercare planning with referrals and resources and Increase social support  Therapeutic Interventions: Assess for all discharge needs, 1 to 1 time with Social worker, Explore available resources and support systems, Assess for adequacy in community support network, Educate family and significant other(s) on suicide prevention, Complete Psychosocial Assessment, Interpersonal group therapy.  Evaluation of Outcomes: Not Met   Progress in Treatment: Attending groups: No. Participating in groups: No. Taking medication as prescribed: No. Toleration medication: No. Family/Significant other contact made: No, will contact:  when given permission Patient understands diagnosis: Yes. Discussing patient identified problems/goals with staff: Yes. Medical problems stabilized or resolved: Yes. Denies suicidal/homicidal ideation: Yes. Issues/concerns per patient self-inventory: No. Other: none  New problem(s) identified: No, Describe:  none  New Short Term/Long Term Goal(s):  Patient Goals:  Learn how to get through things better  Discharge Plan or Barriers:   Reason for Continuation of Hospitalization: Depression Medication stabilization  Estimated Length of Stay: 3-5 days  Attendees: Patient:[name] 04/06/2018   Physician: Dr. Parke Poisson, MD 04/06/2018 2:14 PM  Nursing:  04/06/2018 2:14 PM  [place], RN 04/06/2018 2:14 PM  Social Worker: Lurline Idol, LCSW 04/06/2018 2:14 PM  Recreational Therapist:  04/06/2018 2:14 PM  Other:  04/06/2018 2:14 PM  Other:  04/06/2018 2:14 PM  Other:  04/06/2018 2:14 PM    Scribe for Treatment Team: Joanne Chars, [place] 04/06/2018 2:14 PM

## 2018-04-06 NOTE — Progress Notes (Signed)
Admission note:  Patient is a 47 yo female admitted for depression and suicidal ideation.  Patient was brought to [facility] by her son.  Patient has had suicidal thoughts fro the past 2 weeks.  She is taking care of her ex-husband, who she lives with.  Patient is also taking care of the 3 young grandchildren, ages 86, 77, and 70. Patient's states that their mother is in prison in [place] for drugs, and that she is currently pregnant again.  Her daughter's baby is due in a month.  She feels overwhelmed, tearful, and "has thought about ways to die."  She feels that she is abandoning her grandchildren.  She denies any alcohol abuse, however, has been drinking more wine lately.  Her BAL was 125 and she was positive for benzos.  Patient is prescribed valium from her PCP and has been taking it for a few years.  She states before that she "had been on xanax for 12 years."  She denies any illicit drug use or over abusing alcohol.  Patient is extremely tearful and depressed.  She was oriented to room and unit.  Only medical information from patient was hx of migraines.

## 2018-04-06 NOTE — Progress Notes (Signed)
Informed Casimiro Needle that pt has been accepted to [facility] after 0900.

## 2018-04-06 NOTE — ED Notes (Signed)
TTS speaking with pt.  

## 2018-04-07 MED ORDER — VENLAFAXINE HCL ER 75 MG PO CP24
75.0000 mg | ORAL_CAPSULE | Freq: Every day | ORAL | Status: DC
  Administered 2018-04-08 – 2018-04-09 (×2): 75 mg via ORAL
  Filled 2018-04-07 (×4): qty 1

## 2018-04-07 MED ORDER — MIRTAZAPINE 15 MG PO TABS
7.5000 mg | ORAL_TABLET | Freq: Once | ORAL | Status: AC
  Administered 2018-04-07: 7.5 mg via ORAL
  Filled 2018-04-07: qty 1
  Filled 2018-04-07: qty 0.5

## 2018-04-07 MED ORDER — DIAZEPAM 5 MG PO TABS
5.0000 mg | ORAL_TABLET | Freq: Two times a day (BID) | ORAL | Status: DC | PRN
  Administered 2018-04-07 – 2018-04-08 (×2): 5 mg via ORAL
  Filled 2018-04-07 (×2): qty 1

## 2018-04-07 NOTE — Progress Notes (Signed)
Pt is new to the unit late this afternoon.  She was observed sitting in the dayroom most of the evening.  Previous RN reported that pt has been crying since coming on the unit, but by the time writer went to speak with her, she was not crying and calm.  She does still admit to some passive suicidal thoughts, but contracts for safety with Clinical research associate.  She denies HI/AVH.  She was encouraged to make her needs known to staff.  Writer also reviewed the evening routine and the medications that were available to her this evening.  No sleep aid was ordered prior to the shift, so writer spoke to the evening provider and received an order for Mirtazapine 7.5 mg .  She was also given prn Valium 5 mg and Tylenol 650 mg for a headache per request.  Support and encouragement offered.  Discharge plans are in process.  Safety maintained with q15 minute checks.

## 2018-04-07 NOTE — Progress Notes (Addendum)
[facility] MD Progress Note  04/07/2018 3:02 PM Majesti Gambrell  MRN:  [id number] Subjective:  She reports feeling better, and states " I finally slept better last night".  Denies suicidal ideations. Denies medication side effects. States she had good visit from her parents last evening . Objective: I have discussed case with treatment team and have met with patient. 47 year old female presents for worsening depression, neurovegetative symptoms of depression, suicidal thoughts of overdosing, increased anxiety.  Admission blood alcohol level 125, denies any pattern of alcohol abuse or dependence, reports drinks only occasionally. Patient reports improving mood and states feeling significantly better today. Denies suicidal ideations, and presents with a more reactive , fuller range of affect. Thus far tolerating tolerating Effexor XR and Remeron well , without side effects. Presents calm, in no acute distress, no WDL symptoms- no tremors, no diaphoresis, no psychomotor restlessness or agitation. Today visible on unit, going to some groups . Behavior on unit in good control.   Principal Problem:  MDD Diagnosis:   Patient Active Problem List   Diagnosis Date Noted  . Severe recurrent major depression without psychotic features ([place]) [F33.2] 04/06/2018  . Colitis [K52.9] 11/06/2015  . Vomiting and diarrhea [R11.10, R19.7] 11/06/2015  . GERD (gastroesophageal reflux disease) [K21.9] 11/06/2015  . Thrombocytopenia ([place]) [D69.6] 10/21/2015  . Hypokalemia [E87.6] 10/19/2015  . Hypothyroidism, adult [E03.9] 10/19/2015  . Hypovolemia [E86.1] 10/19/2015  . Enterocolitis [K52.9] 10/18/2015  . Headache in front of head [R51] 01/17/2013  . Hyperlipidemia [E78.5] 01/17/2013  . Chest pain [R07.9] 01/16/2013  . Tobacco abuse [Z72.0] 01/16/2013  . Cellulitis of buttock, left [L03.317] 03/14/2012  . Fatty liver [K76.0] 09/01/2011  . Anxiety disorder [F41.9] 09/01/2011  . Abdominal pain [789.0] 09/01/2011   . Pancreatitis [K85.90] 08/30/2011  . Leukocytosis [D72.829] 08/30/2011   Total Time spent with patient: 20 minutes  Past Psychiatric History:   Past Medical History:  Past Medical History:  Diagnosis Date  . Anxiety   . Diverticula of intestine   . IBS (irritable bowel syndrome)   . Migraines   . Thyroid disease   . Ulcerative colitis   . Vertigo     Past Surgical History:  Procedure Laterality Date  . ABDOMINAL HYSTERECTOMY    . APPENDECTOMY    . CHOLECYSTECTOMY    . ESOPHAGOGASTRODUODENOSCOPY N/A 10/23/2015   Procedure: ESOPHAGOGASTRODUODENOSCOPY (EGD);  Surgeon: Rogene Houston, MD;  [facility];  Service: Endoscopy;  Laterality: N/A;  . FLEXIBLE SIGMOIDOSCOPY N/A 11/07/2015   Procedure: FLEXIBLE SIGMOIDOSCOPY;  Surgeon: Rogene Houston, MD;  Location: [facility];  Service: Endoscopy;  Laterality: N/A;  . KNEE ARTHROSCOPY     Family History:  Family History  Problem Relation Age of Onset  . Diabetes Mother   . COPD Mother   . Heart disease Mother   . Breast cancer Paternal Grandmother   . Lung cancer Paternal Grandfather    Family Psychiatric  History:  Social History:  Social History   Substance and Sexual Activity  Alcohol Use No  . Alcohol/week: 0.0 standard drinks   Comment: socially     Social History   Substance and Sexual Activity  Drug Use No    Social History   Socioeconomic History  . Marital status: Married    Spouse name: Not on file  . Number of children: Not on file  . Years of education: Not on file  . Highest education level: Not on file  Occupational History  . Not on file  Social  Needs  . Financial resource strain: Not on file  . Food insecurity:    Worry: Not on file    Inability: Not on file  . Transportation needs:    Medical: Not on file    Non-medical: Not on file  Tobacco Use  . Smoking status: Current Every Day Smoker    Packs/day: 1.00    Years: 20.00    Pack years: 20.00    Types: Cigarettes  .  Smokeless tobacco: Never Used  Substance and Sexual Activity  . Alcohol use: No    Alcohol/week: 0.0 standard drinks    Comment: socially  . Drug use: No  . Sexual activity: Yes    Birth control/protection: Surgical  Lifestyle  . Physical activity:    Days per week: Not on file    Minutes per session: Not on file  . Stress: Not on file  Relationships  . Social connections:    Talks on phone: Not on file    Gets together: Not on file    Attends religious service: Not on file    Active member of club or organization: Not on file    Attends meetings of clubs or organizations: Not on file    Relationship status: Not on file  Other Topics Concern  . Not on file  Social History Narrative  . Not on file   Additional Social History:   Sleep: improved   Appetite:  Good  Current Medications: Current Facility-Administered Medications  Medication Dose Route Frequency Provider Last Rate Last Dose  . acetaminophen (TYLENOL) tablet 650 mg  650 mg Oral Q6H PRN Lindon Romp A, NP      . alum & mag hydroxide-simeth (MAALOX/MYLANTA) 200-200-20 MG/5ML suspension 30 mL  30 mL Oral Q4H PRN Lindon Romp A, NP      . diazepam (VALIUM) tablet 5 mg  5 mg Oral Q8H PRN Cobos, Myer Peer, MD   5 mg at 04/06/18 2137  . ibuprofen (ADVIL,MOTRIN) tablet 600 mg  600 mg Oral Q8H PRN Lindon Romp A, NP   600 mg at 04/06/18 2138  . levothyroxine (SYNTHROID, LEVOTHROID) tablet 200 mcg  200 mcg Oral QAC breakfast Lindon Romp A, NP   200 mcg at 04/07/18 7867  . magnesium hydroxide (MILK OF MAGNESIA) suspension 30 mL  30 mL Oral Daily PRN Lindon Romp A, NP      . mirtazapine (REMERON) tablet 7.5 mg  7.5 mg Oral QHS Lindon Romp A, NP   7.5 mg at 04/06/18 2137  . nicotine (NICODERM CQ - dosed in mg/24 hours) patch 21 mg  21 mg Transdermal Daily Cobos, Myer Peer, MD   21 mg at 04/07/18 0831  . ondansetron (ZOFRAN) tablet 4 mg  4 mg Oral Q8H PRN Lindon Romp A, NP      . pantoprazole (PROTONIX) EC tablet 40 mg   40 mg Oral BID Lindon Romp A, NP   40 mg at 04/07/18 0831  . venlafaxine XR (EFFEXOR-XR) 24 hr capsule 37.5 mg  37.5 mg Oral Q breakfast Cobos, Myer Peer, MD   37.5 mg at 04/07/18 0831    Lab Results:  Results for orders placed or performed during the hospital encounter of 04/05/18 (from the past 48 hour(s))  Rapid urine drug screen (hospital performed)     Status: Abnormal   Collection Time: 04/05/18  9:51 PM  Result Value Ref Range   Opiates NONE DETECTED NONE DETECTED   Cocaine NONE DETECTED NONE DETECTED   Benzodiazepines POSITIVE (  A) NONE DETECTED   Amphetamines NONE DETECTED NONE DETECTED   Tetrahydrocannabinol NONE DETECTED NONE DETECTED   Barbiturates NONE DETECTED NONE DETECTED    Comment: (NOTE) DRUG SCREEN FOR MEDICAL PURPOSES ONLY.  IF CONFIRMATION IS NEEDED FOR ANY PURPOSE, NOTIFY LAB WITHIN 5 DAYS. LOWEST DETECTABLE LIMITS FOR URINE DRUG SCREEN Drug Class                     Cutoff (ng/mL) Amphetamine and metabolites    1000 Barbiturate and metabolites    200 Benzodiazepine                 324 Tricyclics and metabolites     300 Opiates and metabolites        300 Cocaine and metabolites        300 THC                            50 Performed at [facility], [address], [place], [place] [address]   Comprehensive metabolic panel     Status: Abnormal   Collection Time: 04/05/18 10:15 PM  Result Value Ref Range   Sodium 140 135 - 145 mmol/L   Potassium 3.3 (L) 3.5 - 5.1 mmol/L   Chloride 109 98 - 111 mmol/L   CO2 21 (L) 22 - 32 mmol/L   Glucose, Bld 93 70 - 99 mg/dL   BUN 5 (L) 6 - 20 mg/dL   Creatinine, Ser 0.89 0.44 - 1.00 mg/dL   Calcium 9.3 8.9 - 10.3 mg/dL   Total Protein 7.4 6.5 - 8.1 g/dL   Albumin 4.6 3.5 - 5.0 g/dL   AST 23 15 - 41 U/L   ALT 25 0 - 44 U/L   Alkaline Phosphatase 71 38 - 126 U/L   Total Bilirubin 0.4 0.3 - 1.2 mg/dL   GFR calc non Af Amer >60 >60 mL/min   GFR calc Af Amer >60 >60 mL/min    Comment: (NOTE) The eGFR has been  calculated using the CKD EPI equation. This calculation has not been validated in all clinical situations. eGFR's persistently <60 mL/min signify possible Chronic Kidney Disease.    Anion gap 10 5 - 15    Comment: Performed at [facility], [address], [place], [place] [address]  Ethanol     Status: Abnormal   Collection Time: 04/05/18 10:15 PM  Result Value Ref Range   Alcohol, Ethyl (B) 125 (H) <10 mg/dL    Comment: (NOTE) Lowest detectable limit for serum alcohol is 10 mg/dL. For medical purposes only. Performed at [facility], [address], [place], [place] [address]   Salicylate level     Status: None   Collection Time: 04/05/18 10:15 PM  Result Value Ref Range   Salicylate Lvl <4.7 2.8 - 30.0 mg/dL    Comment: Performed at [facility], [address], [place], [place] [address]  Acetaminophen level     Status: Abnormal   Collection Time: 04/05/18 10:15 PM  Result Value Ref Range   Acetaminophen (Tylenol), Serum <10 (L) 10 - 30 ug/mL    Comment: (NOTE) Therapeutic concentrations vary significantly. A range of 10-30 ug/mL  may be an effective concentration for many patients. However, some  are best treated at concentrations outside of this range. Acetaminophen concentrations >150 ug/mL at 4 hours after ingestion  and >50 ug/mL at 12 hours after ingestion are often associated with  toxic reactions. Performed at  [facility], [address] [place], [place] [address]   cbc     Status: Abnormal   Collection Time: 04/05/18 10:15 PM  Result Value Ref Range   WBC 10.9 (H) 4.0 - 10.5 K/uL   RBC 4.47 3.87 - 5.11 MIL/uL   Hemoglobin 13.8 12.0 - 15.0 g/dL   HCT 41.7 36.0 - 46.0 %   MCV 93.3 80.0 - 100.0 fL   MCH 30.9 26.0 - 34.0 pg   MCHC 33.1 30.0 - 36.0 g/dL   RDW 12.9 11.5 - 15.5 %   Platelets 251 150 - 400 K/uL   nRBC 0.0 0.0 - 0.2 %    Comment: Performed at [facility], [address] [place], [place] [address]  TSH     Status: Abnormal   Collection Time:  04/05/18 10:15 PM  Result Value Ref Range   TSH 0.216 (L) 0.350 - 4.500 uIU/mL    Comment: Performed by a 3rd Generation assay with a functional sensitivity of <=0.01 uIU/mL. Performed at [facility], [address], [place],  [address]   I-Stat beta hCG blood, ED     Status: None   Collection Time: 04/05/18 10:34 PM  Result Value Ref Range   I-stat hCG, quantitative <5.0 <5 mIU/mL   Comment 3            Comment:   GEST. AGE      CONC.  (mIU/mL)   <=1 WEEK        5 - 50     2 WEEKS       50 - 500     3 WEEKS       100 - 10,000     4 WEEKS     1,000 - 30,000        FEMALE AND NON-PREGNANT FEMALE:     LESS THAN 5 mIU/mL     Blood Alcohol level:  Lab Results  Component Value Date   ETH 125 (H) 04/05/2018   ETH 217 (H) 35/00/9381    Metabolic Disorder Labs: Lab Results  Component Value Date   HGBA1C  07/12/2007    5.5 (NOTE)   The ADA recommends the following therapeutic goals for glycemic   control related to Hgb A1C measurement:   Goal of Therapy:   < 7.0% Hgb A1C   Action Suggested:  > 8.0% Hgb A1C   Ref:  Diabetes Care, 22, Suppl. 1, 1999   MPG 119 07/12/2007   No results found for: PROLACTIN Lab Results  Component Value Date   CHOL 197 01/17/2013   TRIG 171 (H) 01/17/2013   HDL 32 (L) 01/17/2013   CHOLHDL 6.2 01/17/2013   VLDL 34 01/17/2013   LDLCALC 131 (H) 01/17/2013   LDLCALC 102 (H) 08/31/2011    Physical Findings: AIMS: Facial and Oral Movements Muscles of Facial Expression: None, normal Lips and Perioral Area: None, normal Jaw: None, normal Tongue: None, normal,Extremity Movements Upper (arms, wrists, hands, fingers): None, normal Lower (legs, knees, ankles, toes): None, normal, Trunk Movements Neck, shoulders, hips: None, normal, Overall Severity Severity of abnormal movements (highest score from questions above): None, normal Incapacitation due to abnormal movements: None, normal Patient's awareness of abnormal movements (rate only patient's  report): No Awareness, Dental Status Current problems with teeth and/or dentures?: No Does patient usually wear dentures?: No  CIWA:    COWS:     Musculoskeletal: Strength & Muscle Tone: within normal limits Gait & Station: normal Patient leans: N/A  Psychiatric Specialty  Exam: Physical Exam  ROS no headache, no chest pain, no shortness of breath, no vomiting , no fever, no chills  Blood pressure 118/89, pulse 80, temperature 98.3 F (36.8 C), temperature source Oral, resp. rate 20, height '5\' 6"'  (1.676 m), weight 76.2 kg, SpO2 100 %.Body mass index is 27.12 kg/m.  General Appearance: Well Groomed  Eye Contact:  Good  Speech:  Normal Rate  Volume:  Normal  Mood:  reports improving mood , reports " feeling a lot better"  Affect:  Appropriate and fuller in range, smiles at times appropriately, not tearful today  Thought Process:  Linear and Descriptions of Associations: Intact  Orientation:  Full (Time, Place, and Person)  Thought Content:  no hallucinations, no delusions, not internally preoccupied   Suicidal Thoughts:  No denies suicidal or self injurious ideations, denies homicidal or violent ideations  Homicidal Thoughts:  No  Memory:  recent and remote grossly intact   Judgement:  Other:  improving  Insight:  improving   Psychomotor Activity:  Normal  Concentration:  Concentration: Good and Attention Span: Good  Recall:  Good  Fund of Knowledge:  Good  Language:  Good  Akathisia:  Negative  Handed:  Right  AIMS (if indicated):     Assets:  Communication Skills Desire for Improvement Resilience  ADL's:  Intact  Cognition:  WNL  Sleep:  Number of Hours: 6.5   Assessment -  48 year old female presents for worsening depression, neurovegetative symptoms of depression, suicidal thoughts of overdosing, increased anxiety.  Admission blood alcohol level 125, denies any pattern of alcohol abuse or dependence, reports drinks only occasionally.  Today patient reports feeling  significantly better, less depressed. Presents with improving mood and range of affect today, and denies suicidal ideations. Denies medication side effects.  Treatment Plan Summary: Daily contact with patient to assess and evaluate symptoms and progress in treatment, Medication management, Plan inpatient treatment  and medications as below Encourage group and milieu participation to work on coping skills and symptom reduction Continue Remeron 7.5 mgrs QHS for depression, insomnia Increase Effexor XR to 75 mgrs QDAY for depression, anxiety Decrease Valium 5 mgrs BID PRN for anxiety Continue Synthroid for Hypothyroidism Treatment team working on disposition planning options Check BMP in AM to monitor K+ Check EKG to monitor QTc  Jenne Campus, MD 04/07/2018, 3:02 PM

## 2018-04-07 NOTE — [facility] Group Notes (Signed)
[facility] LCSW Group Therapy Note  Date/Time: 04/07/18, 1315  Type of Therapy/Topic:  Group Therapy:  Balance in Life  Participation Level:  active  Description of Group:    This group will address the concept of balance and how it feels and looks when one is unbalanced. Patients will be encouraged to process areas in their lives that are out of balance, and identify reasons for remaining unbalanced. Facilitators will guide patients utilizing problem- solving interventions to address and correct the stressor making their life unbalanced. Understanding and applying boundaries will be explored and addressed for obtaining  and maintaining a balanced life. Patients will be encouraged to explore ways to assertively make their unbalanced needs known to significant others in their lives, using other group members and facilitator for support and feedback.  Therapeutic Goals: 1. Patient will identify two or more emotions or situations they have that consume much of in their lives. 2. Patient will identify signs/triggers that life has become out of balance:  3. Patient will identify two ways to set boundaries in order to achieve balance in their lives:  4. Patient will demonstrate ability to communicate their needs through discussion and/or role plays  Summary of Patient Progress: Pt shared that family issues are consuming much of her life.  Pt very active during group discussion about setting boundaries to help keep life more in balance.         Therapeutic Modalities:   Cognitive Behavioral Therapy Solution-Focused Therapy Assertiveness Training  Daleen Squibb, [place]
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
## 2018-04-07 NOTE — [facility] Suicide Risk Assessment (Signed)
[facility] INPATIENT:  Family/Significant Other Suicide Prevention Education  Suicide Prevention Education:  Education Completed; [name], mother [phone]) has been identified by the patient as the family member/significant other with whom the patient will be residing, and identified as the person(s) who will aid the patient in the event of a mental health crisis (suicidal ideations/suicide attempt).  With written consent from the patient, the family member/significant other has been provided the following suicide prevention education, prior to the and/or following the discharge of the patient.  The suicide prevention education provided includes the following:  Suicide risk factors  Suicide prevention and interventions  National Suicide Hotline telephone number  [facility] assessment telephone number  [facility] Emergency Assistance 911  [facility] and/or Residential Mobile Crisis Unit telephone number  Request made of family/significant other to:  Remove weapons (e.g., guns, rifles, knives), all items previously/currently identified as safety concern.    Remove drugs/medications (over-the-counter, prescriptions, illicit drugs), all items previously/currently identified as a safety concern.  The family member/significant other verbalizes understanding of the suicide prevention education information provided.  The family member/significant other agrees to remove the items of safety concern listed above.  Patient's mother reports she does not have any questions or concerns at this time.   [name] 04/07/2018, 2:29 PM

## 2018-04-08 DIAGNOSIS — E039 Hypothyroidism, unspecified: Secondary | ICD-10-CM

## 2018-04-08 DIAGNOSIS — F17210 Nicotine dependence, cigarettes, uncomplicated: Secondary | ICD-10-CM

## 2018-04-08 DIAGNOSIS — Z635 Disruption of family by separation and divorce: Secondary | ICD-10-CM

## 2018-04-08 DIAGNOSIS — G4700 Insomnia, unspecified: Secondary | ICD-10-CM

## 2018-04-08 LAB — BASIC METABOLIC PANEL
ANION GAP: 11 (ref 5–15)
BUN: 18 mg/dL (ref 6–20)
CALCIUM: 9.8 mg/dL (ref 8.9–10.3)
CO2: 25 mmol/L (ref 22–32)
Chloride: 106 mmol/L (ref 98–111)
Creatinine, Ser: 1.1 mg/dL — ABNORMAL HIGH (ref 0.44–1.00)
GFR calc Af Amer: >60 mL/min (ref >60)
GFR calc non Af Amer: 60 mL/min — ABNORMAL LOW (ref >60)
GLUCOSE: 114 mg/dL — AB (ref 70–99)
POTASSIUM: 4 mmol/L (ref 3.5–5.1)
Sodium: 142 mmol/L (ref 135–145)

## 2018-04-08 MED ORDER — LAMOTRIGINE 25 MG PO TABS
25.0000 mg | ORAL_TABLET | Freq: Every day | ORAL | Status: DC
  Administered 2018-04-08: 25 mg via ORAL
  Filled 2018-04-08 (×3): qty 1

## 2018-04-08 MED ORDER — HYDROXYZINE HCL 50 MG PO TABS
50.0000 mg | ORAL_TABLET | Freq: Once | ORAL | Status: AC
  Administered 2018-04-08: 50 mg via ORAL
  Filled 2018-04-08 (×2): qty 1

## 2018-04-08 NOTE — Progress Notes (Signed)
Pt reported early in the shift that she had a good day and was very grateful for the sleep she had last night.  She says she attended all of the groups and feels she is getting the help she needed.  She is looking forward to being able to sleep tonight.  She voiced no other concerns or issues.  Just before 2300, pt came to NS stating that she was not feeling sleepy and was anxious that she would not be able to sleep.  Writer spoke to the NP who ordered a repeat of the Mirtazapine 7.5 mg.  She seemed very relieved and was able to lie down and relax to go to sleep.  She has been pleasant and appropriate.  She has been in the dayroom all evening talking with peers and watching TV.  She makes her needs known to staff.  Support and encouragement offered.  Discharge plans are in process.  Safety maintained with q15 minute checks.

## 2018-04-08 NOTE — [facility] Group Notes (Signed)
  [facility] LCSW Group Therapy Note  Date/Time: 04/08/18, 1315  Type of Therapy/Topic:  Group Therapy:  Emotion Regulation  Participation Level:  Active   Mood:pleasant  Description of Group:    The purpose of this group is to assist patients in learning to regulate negative emotions and experience positive emotions. Patients will be guided to discuss ways in which they have been vulnerable to their negative emotions. These vulnerabilities will be juxtaposed with experiences of positive emotions or situations, and patients challenged to use positive emotions to combat negative ones. Special emphasis will be placed on coping with negative emotions in conflict situations, and patients will process healthy conflict resolution skills.  Therapeutic Goals: 1. Patient will identify two positive emotions or experiences to reflect on in order to balance out negative emotions:  2. Patient will label two or more emotions that they find the most difficult to experience:  3. Patient will be able to demonstrate positive conflict resolution skills through discussion or role plays:   Summary of Patient Progress:Pt shared that feeling worthless and useless are difficult emotions to experience.  Pt not very active during group discussion but was attentive throughout and made several good comments.       Therapeutic Modalities:   Cognitive Behavioral Therapy Feelings Identification Dialectical Behavioral Therapy  Daleen Squibb, LCSW

## 2018-04-08 NOTE — Progress Notes (Signed)
Recreation Therapy Notes  Date: 10.18.19 Time: 0930 Location: [address] Dayroom  Group Topic: Stress Management  Goal Area(s) Addresses:  Patient will verbalize importance of using healthy stress management.  Patient will identify positive emotions associated with healthy stress management.   Intervention: Stress Management  Activity : Progressive Muscle Relaxation.  LRT introduced the stress management technique of progressive muscle relaxation.  LRT read [name] script to guide patients in tensing each muscle group individually then relaxing them. Patients were to follow along as the script was read.  Education:  Stress Management, Discharge Planning.   Education Outcome: Acknowledges edcuation/In group clarification offered/Needs additional education  Clinical Observations/Feedback: Pt did not attend group.    [name], LRT/CTRS         [name], [name] 04/08/2018 11:15 AM

## 2018-04-08 NOTE — Progress Notes (Signed)
D: Patient reports she slept night, "but not as well as the first night."  She continues to improve.  Her affect is much brighter; no tearful episodes.  She is looking forward to seeing her grandchildren. Patient rates her depression and anxiety as a 2; she denies any hopelessness.  She has better insight regarding take care of herself.  Her goal today is to "stay positive and focused."  Patient has been attending groups and participating in her treatment.  She interacts well with her peers and staff.  She denies any thoughts of self harm.  A: Continue to monitor medication management and MD orders.  Safety checks completed every 15 minutes per protocol.  Offer support and encouragement as needed.  R: Patient is receptive to staff; her behavior is appropriate.

## 2018-04-08 NOTE — Progress Notes (Signed)
Adult Psychoeducational Group Note  Date:  04/08/2018 Time:  1:19 AM  Group Topic/Focus:  Wrap-Up Group:   The focus of this group is to help patients review their daily goal of treatment and discuss progress on daily workbooks.  Participation Level:  Active  Participation Quality:  Appropriate  Affect:  Appropriate  Cognitive:  Appropriate  Insight: Appropriate  Engagement in Group:  Engaged  Modes of Intervention:  Discussion  Additional Comments:  Pt's goal was not cry and stay social with other patients.  Pt stated she met her goal. Pt rated the day ata 7/10.  Torrin Crihfield 04/08/2018, 1:19 AM

## 2018-04-08 NOTE — Progress Notes (Addendum)
[facility] MD Progress Note  04/08/2018 1:26 PM [name]  MRN:  [id number]   Evaluation: Achol observed resting in bed,  she is awake, alert and oriented *3  Presents pleasant, calm and cooperative.  Denies homicidal or suicidal ideations during this assessment.  Reports a recent medication change at admission to the inpatient unit.  Chart reviewed: Prozac 80 mg discontinue.  patient was initiated on Effexor 37.5 mg and Remeron 7.5 mg.  Patient reports taking medications and tolerating well denies any medication side effects i.e. headaches nausea vomiting or diarrhea.  Reports she had a follow-up appointment with her primary psychiatrist Dr. Georgiann Cocker on November 11 however states feeling better since her admission. Dicussed restarting Lamictal 25 mg for mood stabilization.  Rates her depression 3 out of 10 with 10 being the worst.  Reports she has been attending daily group sessions.  Reports sleeping well with medication adjustments.  Support, encouragement, and reassurance was provided  History:Per admission assessment notes:  47 year old female, presented to ED voluntarily on 10/15, reporting depression, sadness, suicidal ideations, with thoughts of overdosing .States her depression is chronic, and was exacerbated yesterday related to an argument with her husband whom she is currently separated from.  Endorses neuro-vegetative symptoms of depression and currently  presents depressed, constricted, intermittently tearful.In addition to depression, also describes increased anxiety, worry, ruminations about " helping a friend out  of mine who was on drugs, but when I needed help, I was turned away".  Principal Problem: Severe recurrent major depression without psychotic features ([place]) Diagnosis:   Patient Active Problem List   Diagnosis Date Noted  . Severe recurrent major depression without psychotic features ([place]) [F33.2] 04/06/2018  . Colitis [K52.9] 11/06/2015  . Vomiting and diarrhea [R11.10,  R19.7] 11/06/2015  . GERD (gastroesophageal reflux disease) [K21.9] 11/06/2015  . Thrombocytopenia ([place]) [D69.6] 10/21/2015  . Hypokalemia [E87.6] 10/19/2015  . Hypothyroidism, adult [E03.9] 10/19/2015  . Hypovolemia [E86.1] 10/19/2015  . Enterocolitis [K52.9] 10/18/2015  . Headache in front of head [R51] 01/17/2013  . Hyperlipidemia [E78.5] 01/17/2013  . Chest pain [R07.9] 01/16/2013  . Tobacco abuse [Z72.0] 01/16/2013  . Cellulitis of buttock, left [L03.317] 03/14/2012  . Fatty liver [K76.0] 09/01/2011  . Anxiety disorder [F41.9] 09/01/2011  . Abdominal pain [789.0] 09/01/2011  . Pancreatitis [K85.90] 08/30/2011  . Leukocytosis [D72.829] 08/30/2011   Total Time spent with patient: 20 minutes  Past Psychiatric History:  Past Medical History:  Past Medical History:  Diagnosis Date  . Anxiety   . Diverticula of intestine   . IBS (irritable bowel syndrome)   . Migraines   . Thyroid disease   . Ulcerative colitis   . Vertigo     Past Surgical History:  Procedure Laterality Date  . ABDOMINAL HYSTERECTOMY    . APPENDECTOMY    . CHOLECYSTECTOMY    . ESOPHAGOGASTRODUODENOSCOPY N/A 10/23/2015   Procedure: ESOPHAGOGASTRODUODENOSCOPY (EGD);  Surgeon: Rogene Houston, MD;  Location: [facility];  Service: Endoscopy;  Laterality: N/A;  . FLEXIBLE SIGMOIDOSCOPY N/A 11/07/2015   Procedure: FLEXIBLE SIGMOIDOSCOPY;  Surgeon: Rogene Houston, MD;  Location: [facility];  Service: Endoscopy;  Laterality: N/A;  . KNEE ARTHROSCOPY     Family History:  Family History  Problem Relation Age of Onset  . Diabetes Mother   . COPD Mother   . Heart disease Mother   . Breast cancer Paternal Grandmother   . Lung cancer Paternal Grandfather    Family Psychiatric  History:  Social History:  Social  History   Substance and Sexual Activity  Alcohol Use No  . Alcohol/week: 0.0 standard drinks   Comment: socially     Social History   Substance and Sexual Activity  Drug Use No    Social  History   Socioeconomic History  . Marital status: Married    Spouse name: Not on file  . Number of children: Not on file  . Years of education: Not on file  . Highest education level: Not on file  Occupational History  . Not on file  Social Needs  . Financial resource strain: Not on file  . Food insecurity:    Worry: Not on file    Inability: Not on file  . Transportation needs:    Medical: Not on file    Non-medical: Not on file  Tobacco Use  . Smoking status: Current Every Day Smoker    Packs/day: 1.00    Years: 20.00    Pack years: 20.00    Types: Cigarettes  . Smokeless tobacco: Never Used  Substance and Sexual Activity  . Alcohol use: No    Alcohol/week: 0.0 standard drinks    Comment: socially  . Drug use: No  . Sexual activity: Yes    Birth control/protection: Surgical  Lifestyle  . Physical activity:    Days per week: Not on file    Minutes per session: Not on file  . Stress: Not on file  Relationships  . Social connections:    Talks on phone: Not on file    Gets together: Not on file    Attends religious service: Not on file    Active member of club or organization: Not on file    Attends meetings of clubs or organizations: Not on file    Relationship status: Not on file  Other Topics Concern  . Not on file  Social History Narrative  . Not on file   Additional Social History:                         Sleep: Fair  Appetite:  Fair  Current Medications: Current Facility-Administered Medications  Medication Dose Route Frequency Provider Last Rate Last Dose  . acetaminophen (TYLENOL) tablet 650 mg  650 mg Oral Q6H PRN Lindon Romp A, NP      . alum & mag hydroxide-simeth (MAALOX/MYLANTA) 200-200-20 MG/5ML suspension 30 mL  30 mL Oral Q4H PRN Lindon Romp A, NP      . diazepam (VALIUM) tablet 5 mg  5 mg Oral Q12H PRN Cobos, Myer Peer, MD   5 mg at 04/07/18 2116  . ibuprofen (ADVIL,MOTRIN) tablet 600 mg  600 mg Oral Q8H PRN Lindon Romp A,  NP   600 mg at 04/07/18 2117  . lamoTRIgine (LAMICTAL) tablet 25 mg  25 mg Oral QHS Derrill Center, NP      . levothyroxine (SYNTHROID, LEVOTHROID) tablet 200 mcg  200 mcg Oral QAC breakfast Lindon Romp A, NP   200 mcg at 04/08/18 0615  . magnesium hydroxide (MILK OF MAGNESIA) suspension 30 mL  30 mL Oral Daily PRN Lindon Romp A, NP      . mirtazapine (REMERON) tablet 7.5 mg  7.5 mg Oral QHS Lindon Romp A, NP   7.5 mg at 04/07/18 2116  . nicotine (NICODERM CQ - dosed in mg/24 hours) patch 21 mg  21 mg Transdermal Daily Cobos, Myer Peer, MD   21 mg at 04/08/18 0744  . ondansetron (ZOFRAN) tablet  4 mg  4 mg Oral Q8H PRN Lindon Romp A, NP      . pantoprazole (PROTONIX) EC tablet 40 mg  40 mg Oral BID Lindon Romp A, NP   40 mg at 04/08/18 0744  . venlafaxine XR (EFFEXOR-XR) 24 hr capsule 75 mg  75 mg Oral Q breakfast Cobos, Myer Peer, MD   75 mg at 04/08/18 0744    Lab Results:  Results for orders placed or performed during the hospital encounter of 04/06/18 (from the past 48 hour(s))  Basic metabolic panel     Status: Abnormal   Collection Time: 04/08/18  6:35 AM  Result Value Ref Range   Sodium 142 135 - 145 mmol/L   Potassium 4.0 3.5 - 5.1 mmol/L   Chloride 106 98 - 111 mmol/L   CO2 25 22 - 32 mmol/L   Glucose, Bld 114 (H) 70 - 99 mg/dL   BUN 18 6 - 20 mg/dL   Creatinine, Ser 1.10 (H) 0.44 - 1.00 mg/dL   Calcium 9.8 8.9 - 10.3 mg/dL   GFR calc non Af Amer 60 (L) >60 mL/min   GFR calc Af Amer >60 >60 mL/min    Comment: (NOTE) The eGFR has been calculated using the CKD EPI equation. This calculation has not been validated in all clinical situations. eGFR's persistently <60 mL/min signify possible Chronic Kidney Disease.    Anion gap 11 5 - 15    Comment: Performed at [facility], [place] [address], [place], [place] [address]    Blood Alcohol level:  Lab Results  Component Value Date   ETH 125 (H) 04/05/2018   ETH 217 (H) 60/45/4098    Metabolic  Disorder Labs: Lab Results  Component Value Date   HGBA1C  07/12/2007    5.5 (NOTE)   The ADA recommends the following therapeutic goals for glycemic   control related to Hgb A1C measurement:   Goal of Therapy:   < 7.0% Hgb A1C   Action Suggested:  > 8.0% Hgb A1C   Ref:  Diabetes Care, 22, Suppl. 1, 1999   MPG 119 07/12/2007   No results found for: PROLACTIN Lab Results  Component Value Date   CHOL 197 01/17/2013   TRIG 171 (H) 01/17/2013   HDL 32 (L) 01/17/2013   CHOLHDL 6.2 01/17/2013   VLDL 34 01/17/2013   LDLCALC 131 (H) 01/17/2013   LDLCALC 102 (H) 08/31/2011    Physical Findings: AIMS: Facial and Oral Movements Muscles of Facial Expression: None, normal Lips and Perioral Area: None, normal Jaw: None, normal Tongue: None, normal,Extremity Movements Upper (arms, wrists, hands, fingers): None, normal Lower (legs, knees, ankles, toes): None, normal, Trunk Movements Neck, shoulders, hips: None, normal, Overall Severity Severity of abnormal movements (highest score from questions above): None, normal Incapacitation due to abnormal movements: None, normal Patient's awareness of abnormal movements (rate only patient's report): No Awareness, Dental Status Current problems with teeth and/or dentures?: No Does patient usually wear dentures?: No  CIWA:    COWS:     Musculoskeletal: Strength & Muscle Tone: within normal limits Gait & Station: normal Patient leans: N/A  Psychiatric Specialty Exam: Physical Exam  Neurological: She is alert.  Psychiatric: She has a normal mood and affect. Her behavior is normal.    Review of Systems  Psychiatric/Behavioral: Positive for depression. Negative for suicidal ideas. The patient is nervous/anxious.   All other systems reviewed and are negative.   Blood pressure (!) 115/91, pulse 79, temperature 97.7 F (36.5  C), temperature source Oral, resp. rate 20, height '5\' 6"'  (1.676 m), weight 76.2 kg, SpO2 100 %.Body mass index is 27.12  kg/m.  General Appearance: Casual  Eye Contact:  Fair  Speech:  Clear and Coherent  Volume:  Normal  Mood:  Anxious and Depressed  Affect:  Congruent  Thought Process:  Coherent  Orientation:  Full (Time, Place, and Person)  Thought Content:  Logical  Suicidal Thoughts:  No  Homicidal Thoughts:  No  Memory:  Immediate;   Fair Recent;   Fair Remote;   Fair  Judgement:  Fair  Insight:  Fair  Psychomotor Activity:  Normal  Concentration:  Concentration: Fair  Recall:  [employer] of Knowledge:  Fair  Language:  Fair  Akathisia:  No  Handed:  Right  AIMS (if indicated):     Assets:  Communication Skills Desire for Improvement Social Support  ADL's:  Intact  Cognition:  WNL  Sleep:  Number of Hours: 5.75     Treatment Plan Summary: Daily contact with patient to assess and evaluate symptoms and progress in treatment and Medication management   Continue with current treatment plan as listed below on 04/08/2018 except were noted  Mood stabilization:  Restarted Lamictal 25 mg p.o. Nightly  Continue Effexor 75 mg p.o. Daily  Continue titration of Valium 5 mg p.o. every 12 as needed  Insomnia:  Continue Remeron 7.5 mg p.o. Nightly  Hypothyroidism:   Continue levothyroxine 200 night p.o. daily    Will continue to monitor vitals ,medication compliance and treatment side effects while patient is here.   Reviewed labsTSH abnormal, potassium 4.0 ,BAL - , UDS + benzodizpines.  CSW will continue working on disposition.  Patient to participate in therapeutic milieu  Derrill Center, NP 04/08/2018, 1:26 PM    ..Agree with NP Progress Note

## 2018-04-09 MED ORDER — MIRTAZAPINE 7.5 MG PO TABS: 7.5000 mg | ORAL_TABLET | Freq: Every day | ORAL | 0 refills | Status: DC

## 2018-04-09 MED ORDER — VENLAFAXINE HCL ER 75 MG PO CP24: 75.0000 mg | ORAL_CAPSULE | Freq: Every day | ORAL | 0 refills | Status: DC

## 2018-04-09 MED ORDER — PANTOPRAZOLE SODIUM 40 MG PO TBEC: 40.0000 mg | DELAYED_RELEASE_TABLET | Freq: Two times a day (BID) | ORAL | 0 refills | Status: DC

## 2018-04-09 MED ORDER — LAMOTRIGINE 25 MG PO TABS: 25.0000 mg | ORAL_TABLET | Freq: Every day | ORAL | 0 refills | Status: DC

## 2018-04-09 MED ORDER — LEVOTHYROXINE SODIUM 200 MCG PO TABS: 200.0000 ug | ORAL_TABLET | Freq: Every day | ORAL | 0 refills | Status: AC

## 2018-04-09 NOTE — Discharge Summary (Addendum)
Physician Discharge Summary Note  Patient:  [name] is an 47 y.o., female MRN:  [id number] DOB:  [birth year] Patient phone:  [phone] (home)  Patient address:   [address] [place] [address],  Total Time spent with patient: 20 minutes  Date of Admission:  04/06/2018 Date of Discharge: 04/09/18  Reason for Admission:  Worsening depression with SI  Principal Problem: Severe recurrent major depression without psychotic features [facility]) Discharge Diagnoses: Patient Active Problem List   Diagnosis Date Noted  . Severe recurrent major depression without psychotic features ([place]) [F33.2] 04/06/2018  . Colitis [K52.9] 11/06/2015  . Vomiting and diarrhea [R11.10, R19.7] 11/06/2015  . GERD (gastroesophageal reflux disease) [K21.9] 11/06/2015  . Thrombocytopenia ([place]) [D69.6] 10/21/2015  . Hypokalemia [E87.6] 10/19/2015  . Hypothyroidism, adult [E03.9] 10/19/2015  . Hypovolemia [E86.1] 10/19/2015  . Enterocolitis [K52.9] 10/18/2015  . Headache in front of head [R51] 01/17/2013  . Hyperlipidemia [E78.5] 01/17/2013  . Chest pain [R07.9] 01/16/2013  . Tobacco abuse [Z72.0] 01/16/2013  . Cellulitis of buttock, left [L03.317] 03/14/2012  . Fatty liver [K76.0] 09/01/2011  . Anxiety disorder [F41.9] 09/01/2011  . Abdominal pain [789.0] 09/01/2011  . Pancreatitis [K85.90] 08/30/2011  . Leukocytosis [D72.829] 08/30/2011    Past Psychiatric History: denies prior psychiatric admissions .Reports history of depression, which she describes as chronic, but waxes and wanes in severity.  Denies history of prior suicidal attempts, denies history of self cutting, denies history of psychosis, denies history of mania or hypomania, denies history of violence. Reports history of anxiety, irregular panic attacks, some agoraphobia.    Past Medical History:  Past Medical History:  Diagnosis Date  . Anxiety   . Diverticula of intestine   . IBS (irritable bowel syndrome)   . Migraines   .  Thyroid disease   . Ulcerative colitis   . Vertigo     Past Surgical History:  Procedure Laterality Date  . ABDOMINAL HYSTERECTOMY    . APPENDECTOMY    . CHOLECYSTECTOMY    . ESOPHAGOGASTRODUODENOSCOPY N/A 10/23/2015   Procedure: ESOPHAGOGASTRODUODENOSCOPY (EGD);  Surgeon: Rogene Houston, MD;  Location[facility];  Service: Endoscopy;  Laterality: N/A;  . FLEXIBLE SIGMOIDOSCOPY N/A 11/07/2015   Procedure: FLEXIBLE SIGMOIDOSCOPY;  Surgeon: Rogene Houston, MD;  Location: [facility];  Service: Endoscopy;  Laterality: N/A;  . KNEE ARTHROSCOPY     Family History:  Family History  Problem Relation Age of Onset  . Diabetes Mother   . COPD Mother   . Heart disease Mother   . Breast cancer Paternal Grandmother   . Lung cancer Paternal Grandfather    Family Psychiatric  History: denies history of mental illness, denies history of alcohol abuse, denies history of suicides in family  Social History:  Social History   Substance and Sexual Activity  Alcohol Use No  . Alcohol/week: 0.0 standard drinks   Comment: socially     Social History   Substance and Sexual Activity  Drug Use No    Social History   Socioeconomic History  . Marital status: Married    Spouse name: Not on file  . Number of children: Not on file  . Years of education: Not on file  . Highest education level: Not on file  Occupational History  . Not on file  Social Needs  . Financial resource strain: Not on file  . Food insecurity:    Worry: Not on file    Inability: Not on file  . Transportation needs:  Medical: Not on file    Non-medical: Not on file  Tobacco Use  . Smoking status: Current Every Day Smoker    Packs/day: 1.00    Years: 20.00    Pack years: 20.00    Types: Cigarettes  . Smokeless tobacco: Never Used  Substance and Sexual Activity  . Alcohol use: No    Alcohol/week: 0.0 standard drinks    Comment: socially  . Drug use: No  . Sexual activity: Yes    Birth control/protection:  Surgical  Lifestyle  . Physical activity:    Days per week: Not on file    Minutes per session: Not on file  . Stress: Not on file  Relationships  . Social connections:    Talks on phone: Not on file    Gets together: Not on file    Attends religious service: Not on file    Active member of club or organization: Not on file    Attends meetings of clubs or organizations: Not on file    Relationship status: Not on file  Other Topics Concern  . Not on file  Social History Narrative  . Not on file    Hospital Course:  04/06/18 [facility] Counselor Assessment: 47 y.o. female with SI with plan to overdose. Patient reported being under a lot emotional stress at home. Patient reported "I had enough, cooking, cleaning, homework, 5 other people in the house not doing anything, I am tired, I called mom and told her I need help". Patient reported that her son and his girlfriend brought her to ED and her parents met them at the ED. Patient reports onset of SI was 2 weeks. Patient reported areas of emotional stress includes, taking care of her 3 grandchildren, as their mother, her daughter is a drug addict. Also, patient stated, " I am taking care of my ex husband, watching him die with stage 5 renal failure and recent heart attacks".  Patient reports feeling increasingly hopeless and began drinking. Patient reports drinking 2 bottles of wine which is unusual for her. Patient reports taking her normal doses of home medications today. Patient reports increased depressive symptoms. Patient denied HI and psychosis. Patient cooperative during assessment.   04/06/18 [place] MDD Assessment:47 year old female, presented to ED voluntarily on 10/15, reporting depression, sadness, suicidal ideations, with thoughts of overdosing .States her depression is chronic, and was exacerbated yesterday related to an argument with her husband whom she is currently separated from.  Endorses neuro-vegetative symptoms of depression and  currently  presents depressed, constricted, intermittently tearful. In addition to depression, also describes increased anxiety, worry, ruminations about " helping a friend out  of mine who was on drugs, but when I needed help, I was turned away". Denies psychotic symptoms. Admission BAL 125, admission UDS positive for benzodiazepines - patient denies pattern of alcohol abuse or dependence and states does not drink often, but had been drinking on day of admission.   Patient remained on the [facility] unit for 3 days. The patient stabilized on medication and therapy. Patient was discharged on Lamictal 25 mg Daily, Remeron 7.5 mg QHS, Effexor-XR 75 mg Daily. Patient has shown improvement with improved mood, affect, sleep, appetite, and interaction. Patient has attended group and participated. Patient has been seen in the day room interacting with peers and staff appropriately. Patient denies any SI/HI/AVH and contracts for safety. Patient agrees to follow up at [facility] IOP and [facility]. Patient is provided with prescriptions for their medications upon discharge.  Physical Findings: AIMS: Facial and Oral Movements Muscles of Facial Expression: None, normal Lips and Perioral Area: None, normal Jaw: None, normal Tongue: None, normal,Extremity Movements Upper (arms, wrists, hands, fingers): None, normal Lower (legs, knees, ankles, toes): None, normal, Trunk Movements Neck, shoulders, hips: None, normal, Overall Severity Severity of abnormal movements (highest score from questions above): None, normal Incapacitation due to abnormal movements: None, normal Patient's awareness of abnormal movements (rate only patient's report): No Awareness, Dental Status Current problems with teeth and/or dentures?: No Does patient usually wear dentures?: No  CIWA:    COWS:     Musculoskeletal: Strength & Muscle Tone: within normal limits Gait & Station: normal Patient leans: N/A  Psychiatric Specialty  Exam: Physical Exam  Nursing note and vitals reviewed. Constitutional: She is oriented to person, place, and time. She appears well-developed and well-nourished.  Cardiovascular: Normal rate.  Respiratory: Effort normal.  Musculoskeletal: Normal range of motion.  Neurological: She is alert and oriented to person, place, and time.  Skin: Skin is warm.    Review of Systems  Constitutional: Negative.   HENT: Negative.   Eyes: Negative.   Respiratory: Negative.   Cardiovascular: Negative.   Gastrointestinal: Negative.   Genitourinary: Negative.   Musculoskeletal: Negative.   Skin: Negative.   Neurological: Negative.   Endo/Heme/Allergies: Negative.   Psychiatric/Behavioral: Negative.     Blood pressure 119/68, pulse 67, temperature 97.6 F (36.4 C), temperature source Oral, resp. rate 20, height 5' 6" (1.676 m), weight 76.2 kg, SpO2 100 %.Body mass index is 27.12 kg/m.  General Appearance: Casual  Eye Contact:  Good  Speech:  Clear and Coherent and Normal Rate  Volume:  Normal  Mood:  Euthymic  Affect:  Appropriate  Thought Process:  Goal Directed and Descriptions of Associations: Intact  Orientation:  Full (Time, Place, and Person)  Thought Content:  WDL  Suicidal Thoughts:  No  Homicidal Thoughts:  No  Memory:  Immediate;   Good Recent;   Good Remote;   Good  Judgement:  Good  Insight:  Fair  Psychomotor Activity:  Normal  Concentration:  Concentration: Good and Attention Span: Good  Recall:  Good  Fund of Knowledge:  Good  Language:  Good  Akathisia:  No  Handed:  Right  AIMS (if indicated):     Assets:  Communication Skills Desire for Improvement Financial Resources/Insurance Housing Physical Health Social Support Transportation  ADL's:  Intact  Cognition:  WNL  Sleep:  Number of Hours: 4.75     Have you used any form of tobacco in the last 30 days? (Cigarettes, Smokeless Tobacco, Cigars, and/or Pipes): Yes  Has this patient used any form of tobacco in  the last 30 days? (Cigarettes, Smokeless Tobacco, Cigars, and/or Pipes) Yes, Yes, A prescription for an FDA-approved tobacco cessation medication was offered at discharge and the patient refused  Blood Alcohol level:  Lab Results  Component Value Date   ETH 125 (H) 04/05/2018   ETH 217 (H) 03/28/1218    Metabolic Disorder Labs:  Lab Results  Component Value Date   HGBA1C  07/12/2007    5.5 (NOTE)   The ADA recommends the following therapeutic goals for glycemic   control related to Hgb A1C measurement:   Goal of Therapy:   < 7.0% Hgb A1C   Action Suggested:  > 8.0% Hgb A1C   Ref:  Diabetes Care, 22, Suppl. 1, 1999   MPG 119 07/12/2007   No results found for: PROLACTIN Lab Results  Component Value  Date   CHOL 197 01/17/2013   TRIG 171 (H) 01/17/2013   HDL 32 (L) 01/17/2013   CHOLHDL 6.2 01/17/2013   VLDL 34 01/17/2013   LDLCALC 131 (H) 01/17/2013   LDLCALC 102 (H) 08/31/2011    See Psychiatric Specialty Exam and Suicide Risk Assessment completed by Attending Physician prior to discharge.  Discharge destination:  Home  Is patient on multiple antipsychotic therapies at discharge:  No   Has Patient had three or more failed trials of antipsychotic monotherapy by history:  No  Recommended Plan for Multiple Antipsychotic Therapies: NA   Allergies as of 04/09/2018      Reactions   Morphine And Related Itching, Rash      Medication List    STOP taking these medications   diazepam 10 MG tablet Commonly known as:  VALIUM   FLUoxetine 40 MG capsule Commonly known as:  PROZAC     TAKE these medications     Indication  lamoTRIgine 25 MG tablet Commonly known as:  LAMICTAL Take 1 tablet (25 mg total) by mouth at bedtime. For mood control What changed:  additional instructions  Indication:  mood stability   levothyroxine 200 MCG tablet Commonly known as:  SYNTHROID, LEVOTHROID Take 1 tablet (200 mcg total) by mouth daily before breakfast.  Indication:  Underactive  Thyroid   mirtazapine 7.5 MG tablet Commonly known as:  REMERON Take 1 tablet (7.5 mg total) by mouth at bedtime.  Indication:  sleep and mood stability   pantoprazole 40 MG tablet Commonly known as:  PROTONIX Take 1 tablet (40 mg total) by mouth 2 (two) times daily.  Indication:  Gastroesophageal Reflux Disease   venlafaxine XR 75 MG 24 hr capsule Commonly known as:  EFFEXOR-XR Take 1 capsule (75 mg total) by mouth daily with breakfast. For mood control Start taking on:  04/10/2018  Indication:  mood stability      Follow-up Information    BEHAVIORAL HEALTH INTENSIVE PSYCH Follow up.   Specialty:  Behavioral Health Why:  Your social worker will contact you on Monday 04/11/18 at [phone] with information about your follow-up, either a date/time to start Intensive Outpatient Program (IOP) and appointment at [facility]. Contact information: [place] [id number] mc [place] [phone]       Services, [facility] Follow up.   Why:  Your social worker will contact you on Monday 04/11/18 with appointment dates/times. Contact information: [place] [address] [phone]           Follow-up recommendations:  Continue activity as tolerated. Continue diet as recommended by your PCP. Ensure to keep all appointments with outpatient providers.  Comments:  Patient is instructed prior to discharge to: Take all medications as prescribed by his/her mental healthcare provider. Report any adverse effects and or reactions from the medicines to his/her outpatient provider promptly. Patient has been instructed & cautioned: To not engage in alcohol and or illegal drug use while on prescription medicines. In the event of worsening symptoms, patient is instructed to call the crisis hotline, 911 and or go to the nearest ED for appropriate evaluation and treatment of symptoms. To follow-up with his/her primary care provider for your  other medical issues, concerns and or health care needs.    Signed: Lowry Ram Money, FNP 04/09/2018, 1:40 PM   Patient seen, Suicide Assessment Completed.  Disposition Plan Reviewed

## 2018-04-09 NOTE — Progress Notes (Signed)
Pt completed her daily assessment and on this she wrote she denied active SI today and she rated her depression, hopelessness and anxiety " 0/0/3", respectively . DC instructions were reviewed with pt by this Clinical research associate and pt stated verbal understandign and willingness to comply . Pt was given cc of dc isntructions ( SRA, AVS, SSP and transition record) and then all belongins in pt's locker were returned to her per poc. Pt was escorted to bldg entrance and dc'd to self care per MD order.

## 2018-04-09 NOTE — Progress Notes (Signed)
  [facility] Adult Case Management Discharge Plan :  Will you be returning to the same living situation after discharge:  Yes,  home with family At discharge, do you have transportation home?: Yes,  family Do you have the ability to pay for your medications: Yes,  Has insurance by limited income  Release of information consent forms completed and turned in to Medical Records by CSW.   Patient to Follow up at: Follow-up Information    BEHAVIORAL HEALTH INTENSIVE PSYCH Follow up.   Specialty:  Behavioral Health Why:  Your social worker will contact you on Monday 04/11/18 at [phone] with information about your follow-up, either a date/time to start Intensive Outpatient Program (IOP) and appointment at [facility]. Contact information: [address] [phone] mc [place] [address] [phone]       Services, [facility] Follow up.   Why:  Your social worker will contact you on Monday 04/11/18 with appointment dates/times. Contact information: [address] [place] [address] [phone]           Next level of care provider has access to [facility] Link:no  Safety Planning and Suicide Prevention discussed: Yes,  with mother  Have you used any form of tobacco in the last 30 days? (Cigarettes, Smokeless Tobacco, Cigars, and/or Pipes): Yes  Has patient been referred to the Quitline?: Patient refused referral  Patient has been referred for addiction treatment: N/A  Lynnell Chad, LCSW 04/09/2018, 11:25 AM

## 2018-04-09 NOTE — BHH Suicide Risk Assessment (Signed)
[facility] Discharge Suicide Risk Assessment   Principal Problem: Severe recurrent major depression without psychotic features [facility]) Discharge Diagnoses:  Patient Active Problem List   Diagnosis Date Noted  . Severe recurrent major depression without psychotic features ([facility]) [F33.2] 04/06/2018  . Colitis [K52.9] 11/06/2015  . Vomiting and diarrhea [R11.10, R19.7] 11/06/2015  . GERD (gastroesophageal reflux disease) [K21.9] 11/06/2015  . Thrombocytopenia ([facility]) [D69.6] 10/21/2015  . Hypokalemia [E87.6] 10/19/2015  . Hypothyroidism, adult [E03.9] 10/19/2015  . Hypovolemia [E86.1] 10/19/2015  . Enterocolitis [K52.9] 10/18/2015  . Headache in front of head [R51] 01/17/2013  . Hyperlipidemia [E78.5] 01/17/2013  . Chest pain [R07.9] 01/16/2013  . Tobacco abuse [Z72.0] 01/16/2013  . Cellulitis of buttock, left [L03.317] 03/14/2012  . Fatty liver [K76.0] 09/01/2011  . Anxiety disorder [F41.9] 09/01/2011  . Abdominal pain [789.0] 09/01/2011  . Pancreatitis [K85.90] 08/30/2011  . Leukocytosis [D72.829] 08/30/2011    Total Time spent with patient: 30 minutes  Musculoskeletal: Strength & Muscle Tone: within normal limits Gait & Station: normal Patient leans: N/A  Psychiatric Specialty Exam: ROS no headache, no chest pain, no shortness of breath, no vomiting, no fever, no chills , reports some left hip discomfort ( no trauma)   Blood pressure 119/68, pulse 67, temperature 97.6 F (36.4 C), temperature source Oral, resp. rate 20, height 5\' 6"  (1.676 m), weight 76.2 kg, SpO2 100 %.Body mass index is 27.12 kg/m.  General Appearance: improved grooming   Eye Contact::  Good  Speech:  Normal Rate409  Volume:  Normal  Mood:  reports " my mood is a lot better"  Affect:  Appropriate and Full Range  Thought Process:  Linear and Descriptions of Associations: Intact  Orientation:  Other:  fully alert and attentive  Thought Content:  no hallucinations, no delusions,not internally preoccupied    Suicidal Thoughts:  No denies suicidal or self injurious ideations, denies homicidal or violent ideations  Homicidal Thoughts:  No  Memory:  recent and remote grossly intact   Judgement:  Other:  improving   Insight:  Present  Psychomotor Activity:  Normal  Concentration:  Good  Recall:  Good  Fund of Knowledge:Good  Language: Good  Akathisia:  Negative  Handed:  Right  AIMS (if indicated):     Assets:  Communication Skills Desire for Improvement Resilience  Sleep:  Number of Hours: 4.75  Cognition: WNL  ADL's:  Intact   Mental Status Per Nursing Assessment::   On Admission:  Suicidal ideation indicated by patient, Self-harm behaviors, Belief that plan would result in death  Demographic Factors:  47, married/separated, has three adult children, unemployed   Loss Factors: Recent argument with husband, adult daughter incarcerated/pregnant, patient is caretaker for her grandchildren   Historical Factors: No prior psychiatric admissions, history of depression, no history of prior suicidal attempts,   Risk Reduction Factors:   Responsible for children under 46 years of age, Sense of responsibility to family and Positive coping skills or problem solving skills  Continued Clinical Symptoms:  At this time patient is alert, attentive, calm, mood is improved and states she is feeling better than she has in a long time, affect is more reactive, fuller in range, no thought disorder, no SI or HI, no psychotic symptoms, future oriented  Behavior on unit in good control- pleasant on approach Denies medication side effects, side effects reviewed - including risk of sedation on Remeron, risk of discontinuation withdrawal symptoms if Venlafaxine stopped abruptly, risk of HTN on Venlafaxine, and risk of severe rash on Lamictal.  Cognitive Features That Contribute To Risk:  No gross cognitive deficits noted upon discharge. Is alert , attentive, and oriented x 3   Suicide Risk:  Mild:   Suicidal ideation of limited frequency, intensity, duration, and specificity.  There are no identifiable plans, no associated intent, mild dysphoria and related symptoms, good self-control (both objective and subjective assessment), few other risk factors, and identifiable protective factors, including available and accessible social support.    Plan Of Care/Follow-up recommendations:  Activity:  as tolerated  Diet:  regular Tests:  NA Other:  See below  Patient is expressing readiness for discharge and leaving unit in good spirits  Plans to return home She has an established PCP for medical management as needed   Craige Cotta, MD 04/09/2018, 8:45 AM

## 2018-04-09 NOTE — BHH Group Notes (Signed)
LCSW Group Therapy Note  04/09/2018   10:00-11:00am   Type of Therapy and Topic:  Group Therapy: Anger Cues and Responses  Participation Level:  Active   Description of Group:   In this group, patients learned how to recognize the physical, cognitive, emotional, and behavioral responses they have to anger-provoking situations.  They identified a recent time they became angry and how they reacted.  They analyzed how their reaction was possibly beneficial and how it was possibly unhelpful.  The group discussed a variety of healthier coping skills that could help with such a situation in the future.  Deep breathing was practiced briefly.  Therapeutic Goals: 1. Patients will remember their last incident of anger and how they felt emotionally and physically, what their thoughts were at the time, and how they behaved. 2. Patients will identify how their behavior at that time worked for them, as well as how it worked against them. 3. Patients will explore possible new behaviors to use in future anger situations. 4. Patients will learn that anger itself is normal and cannot be eliminated, and that healthier reactions can assist with resolving conflict rather than worsening situations.  Summary of Patient Progress:  The patient shared that her most recent time of anger was yesterday and involved her interaction with another patient.She stated she utilized self-talk to remain calm and was able to remove herself the source. The patient stated that she has used this strategy before but this most recent time she was able to remain calmer than past times. Patient was provided with information and knowledge that there are emotional and physical responses associated with anger. Patient understands that anger is a normal and natural human response. Patient is able to identify coping skills that they can readily access and use to mitigate feelings of anger.  Therapeutic Modalities:   Cognitive Behavioral  Therapy  Evorn Gong, LCSW   Evorn Gong

## 2018-04-09 NOTE — Progress Notes (Signed)
Just as visitation was ending, pt, who was visiting with her parents, stated that she was having sudden pain to her L hip that radiated down her leg.  She said it happened all of a sudden and made her leg feel numb.  Writer assessed pt's pain by asking questions, then asked pt to end her visitation so she could be assisted back to her room.  Pt was able to walk to her room with writer by her side.  Pt was given Motrin, Valium, and heat packs at that time for her discomfort with a pain level of 9/10.  During reassessment later, pt reported that the pain had not subsided.  Writer notified the evening provider who gave pt a one time dose of Vistaril 50 mg to relax her.  By the time pt went to bed, she reports the pain has not resolved.  Pt has been given heat and ice packs to alternate for comfort.  No additional medication orders were given.  Pt denies SI/HI/AVH this evening.  Support and encouragement offered.  Discharge plans are in process.  Pt plans to return home at discharge.  Safety maintained with q15 minute checks.

## 2018-05-02 DIAGNOSIS — F419 Anxiety disorder, unspecified: Secondary | ICD-10-CM | POA: Diagnosis not present

## 2018-05-02 DIAGNOSIS — E039 Hypothyroidism, unspecified: Secondary | ICD-10-CM | POA: Diagnosis not present

## 2018-05-02 DIAGNOSIS — F322 Major depressive disorder, single episode, severe without psychotic features: Secondary | ICD-10-CM | POA: Diagnosis not present

## 2018-05-02 DIAGNOSIS — G4700 Insomnia, unspecified: Secondary | ICD-10-CM | POA: Diagnosis not present
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
## 2018-05-06 ENCOUNTER — Encounter (HOSPITAL_COMMUNITY): Payer: Self-pay | Admitting: Psychiatry

## 2018-05-06 ENCOUNTER — Ambulatory Visit (INDEPENDENT_AMBULATORY_CARE_PROVIDER_SITE_OTHER): Payer: BLUE CROSS/BLUE SHIELD | Admitting: Psychiatry

## 2018-05-06 VITALS — BP 129/96 | HR 110 | Ht 66.0 in | Wt 186.0 lb

## 2018-05-06 DIAGNOSIS — G4700 Insomnia, unspecified: Secondary | ICD-10-CM | POA: Diagnosis not present

## 2018-05-06 DIAGNOSIS — F331 Major depressive disorder, recurrent, moderate: Secondary | ICD-10-CM

## 2018-05-06 DIAGNOSIS — F419 Anxiety disorder, unspecified: Secondary | ICD-10-CM

## 2018-05-06 MED ORDER — VENLAFAXINE HCL ER 150 MG PO CP24: 150.0000 mg | ORAL_CAPSULE | Freq: Every day | ORAL | 0 refills | Status: DC

## 2018-05-06 MED ORDER — HYDROXYZINE HCL 25 MG PO TABS: 25.0000 mg | ORAL_TABLET | Freq: Every day | ORAL | 0 refills | Status: DC | PRN

## 2018-05-06 NOTE — Patient Instructions (Addendum)
1. Increase Effexor 150 mg daily  2. Discontinue lamotrigine, mirtazapine 4. Start hydroxyzine 25 mg daily as needed for anxiety 5. Return to clinic in one month for 30 mins 6.CONTACT INFORMATION  What to do if you need to get in touch with someone regarding a psychiatric issue:  1. EMERGENCY: For psychiatric emergencies (if you are suicidal or if there are any other safety issues) call 911 and/or go to your nearest Emergency Room immediately.   2. IF YOU NEED SOMEONE TO TALK TO RIGHT NOW: Given my clinical responsibilities, I may not be able to speak with you over the phone for a prolonged period of time.  a. You may always call The National Suicide Prevention Lifeline at 1-800-273-TALK 956-032-9602(8255).  b. Your county of residence will also have local crisis services. [place]: [facility] at [phone] (24 Hour Crisis Hotline)

## 2018-05-12 ENCOUNTER — Ambulatory Visit (HOSPITAL_COMMUNITY): Payer: BLUE CROSS/BLUE SHIELD

## 2018-05-24 ENCOUNTER — Ambulatory Visit (HOSPITAL_COMMUNITY): Admission: RE | Admit: 2018-05-24 | Payer: BLUE CROSS/BLUE SHIELD | Source: Ambulatory Visit
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
## 2018-06-07 ENCOUNTER — Encounter (HOSPITAL_COMMUNITY): Payer: Self-pay | Admitting: Psychiatry

## 2018-06-07 ENCOUNTER — Ambulatory Visit (INDEPENDENT_AMBULATORY_CARE_PROVIDER_SITE_OTHER): Payer: BLUE CROSS/BLUE SHIELD | Admitting: Psychiatry

## 2018-06-07 VITALS — BP 136/90 | HR 106 | Ht 66.0 in | Wt 183.0 lb

## 2018-06-07 DIAGNOSIS — F331 Major depressive disorder, recurrent, moderate: Secondary | ICD-10-CM | POA: Diagnosis not present

## 2018-06-07 MED ORDER — VENLAFAXINE HCL ER 150 MG PO CP24: ORAL_CAPSULE | ORAL | 0 refills | Status: DC

## 2018-06-07 MED ORDER — ZOLPIDEM TARTRATE 5 MG PO TABS: 5.0000 mg | ORAL_TABLET | Freq: Every evening | ORAL | 1 refills | Status: DC | PRN

## 2018-06-07 MED ORDER — VENLAFAXINE HCL ER 75 MG PO CP24: ORAL_CAPSULE | ORAL | 0 refills | Status: DC

## 2018-06-07 NOTE — Patient Instructions (Signed)
1. Increase Effexor 225 mg daily  2. Start Ambien 5 mg at night as needed for sleep 3. Discontinue hydroxyzine  4. Return to clinic in two months for 30 mins

## 2018-06-27 ENCOUNTER — Ambulatory Visit (HOSPITAL_COMMUNITY)
Admission: RE | Admit: 2018-06-27 | Discharge: 2018-06-27 | Disposition: A | Discharge status: Home / Self Care | Payer: BLUE CROSS/BLUE SHIELD | Attending: Psychiatry | Admitting: Psychiatry

## 2018-06-27 DIAGNOSIS — F332 Major depressive disorder, recurrent severe without psychotic features: Secondary | ICD-10-CM | POA: Insufficient documentation

## 2018-06-27 DIAGNOSIS — F411 Generalized anxiety disorder: Secondary | ICD-10-CM | POA: Diagnosis not present

## 2018-06-27 DIAGNOSIS — Z818 Family history of other mental and behavioral disorders: Secondary | ICD-10-CM | POA: Insufficient documentation

## 2018-06-27 DIAGNOSIS — E079 Disorder of thyroid, unspecified: Secondary | ICD-10-CM | POA: Insufficient documentation

## 2018-06-27 DIAGNOSIS — F17210 Nicotine dependence, cigarettes, uncomplicated: Secondary | ICD-10-CM | POA: Insufficient documentation

## 2018-06-27 DIAGNOSIS — Z885 Allergy status to narcotic agent status: Secondary | ICD-10-CM | POA: Insufficient documentation

## 2018-06-27 DIAGNOSIS — K589 Irritable bowel syndrome without diarrhea: Secondary | ICD-10-CM | POA: Insufficient documentation

## 2018-06-27 DIAGNOSIS — F1010 Alcohol abuse, uncomplicated: Secondary | ICD-10-CM | POA: Diagnosis not present
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
## 2018-06-27 NOTE — H&P (Signed)
Behavioral Health Medical Screening Exam  [name] is an 48 y.o. female.  Total Time spent with patient: 15 minutes  Psychiatric Specialty Exam: Physical Exam  Constitutional: She is oriented to person, place, and time. She appears well-developed and well-nourished. No distress.  HENT:  Head: Normocephalic and atraumatic.  Right Ear: External ear normal.  Left Ear: External ear normal.  Eyes: Pupils are equal, round, and reactive to light. Right eye exhibits no discharge. Left eye exhibits no discharge.  Respiratory: Effort normal. No respiratory distress.  Musculoskeletal: Normal range of motion.  Neurological: She is alert and oriented to person, place, and time.  Skin: She is not diaphoretic.  Psychiatric: Her speech is normal. She is not withdrawn and not actively hallucinating. Thought content is not paranoid and not delusional. Cognition and memory are normal. She expresses impulsivity and inappropriate judgment. She exhibits a depressed mood. She expresses no homicidal and no suicidal ideation.    Review of Systems  Constitutional: Negative for chills, fever and weight loss.  Psychiatric/Behavioral: Positive for depression, substance abuse and suicidal ideas. Negative for hallucinations and memory loss. The patient is nervous/anxious and has insomnia.   All other systems reviewed and are negative.   There were no vitals taken for this visit.There is no height or weight on file to calculate BMI.  General Appearance: Casual and Fairly Groomed  Eye Contact:  Good  Speech:  Clear and Coherent and Normal Rate  Volume:  Normal  Mood:  Depressed and Worthless  Affect:  Congruent and Depressed  Thought Process:  Coherent, Goal Directed and Descriptions of Associations: Intact  Orientation:  Full (Time, Place, and Person)  Thought Content:  Logical and Hallucinations: None  Suicidal Thoughts:  Denies current SI  Homicidal Thoughts:  No  Memory:  Immediate;   Good Recent;    Good  Judgement:  Fair  Insight:  Fair  Psychomotor Activity:  Normal  Concentration: Concentration: Fair and Attention Span: Fair  Recall:  Good  Fund of Knowledge:Good  Language: Good  Akathisia:  NA  Handed:  Right  AIMS (if indicated):     Assets:  Communication Skills Desire for Improvement Housing Intimacy Leisure Time Physical Health  Sleep:       Musculoskeletal: Strength & Muscle Tone: within normal limits Gait & Station: normal    Recommendations:  Based on my evaluation the patient does not appear to have an emergency medical condition.  No evidence of imminent risk to self or others at present.   Patient does not meet criteria for psychiatric inpatient admission. Supportive therapy provided about ongoing stressors. Discussed crisis plan, support from social network, calling 911, coming to the Emergency Department, and calling Suicide Hotline. Patient to [facility] and [facility] the [facility].  Patient had 3-4 drinks tonight, but does not appear intoxicated at this time. [facility], she will contact them to take her home.  Jackelyn PolingJason A , NP 06/27/2018, 1:07 AM

## 2018-07-25 ENCOUNTER — Emergency Department (HOSPITAL_COMMUNITY): Payer: BLUE CROSS/BLUE SHIELD

## 2018-07-25 ENCOUNTER — Other Ambulatory Visit: Payer: Self-pay

## 2018-07-25 ENCOUNTER — Encounter (HOSPITAL_COMMUNITY): Payer: Self-pay | Admitting: *Deleted

## 2018-07-25 ENCOUNTER — Emergency Department (HOSPITAL_COMMUNITY)
Admission: EM | Admit: 2018-07-25 | Discharge: 2018-07-25 | Disposition: A | Discharge status: Home / Self Care | Payer: BLUE CROSS/BLUE SHIELD | Attending: Emergency Medicine | Admitting: Emergency Medicine

## 2018-07-25 DIAGNOSIS — R0600 Dyspnea, unspecified: Secondary | ICD-10-CM | POA: Diagnosis not present

## 2018-07-25 DIAGNOSIS — Z79899 Other long term (current) drug therapy: Secondary | ICD-10-CM | POA: Insufficient documentation

## 2018-07-25 DIAGNOSIS — R600 Localized edema: Secondary | ICD-10-CM | POA: Diagnosis not present

## 2018-07-25 DIAGNOSIS — Z86718 Personal history of other venous thrombosis and embolism: Secondary | ICD-10-CM | POA: Insufficient documentation

## 2018-07-25 DIAGNOSIS — R531 Weakness: Secondary | ICD-10-CM | POA: Diagnosis not present

## 2018-07-25 DIAGNOSIS — E785 Hyperlipidemia, unspecified: Secondary | ICD-10-CM | POA: Insufficient documentation

## 2018-07-25 DIAGNOSIS — R220 Localized swelling, mass and lump, head: Secondary | ICD-10-CM | POA: Diagnosis not present

## 2018-07-25 DIAGNOSIS — G9389 Other specified disorders of brain: Secondary | ICD-10-CM | POA: Diagnosis not present

## 2018-07-25 DIAGNOSIS — R0602 Shortness of breath: Secondary | ICD-10-CM | POA: Diagnosis not present

## 2018-07-25 DIAGNOSIS — R609 Edema, unspecified: Secondary | ICD-10-CM | POA: Diagnosis not present

## 2018-07-25 LAB — CBC WITH DIFFERENTIAL/PLATELET
Abs Immature Granulocytes: 0.08 10*3/uL — ABNORMAL HIGH (ref 0.00–0.07)
Basophils Absolute: 0.1 10*3/uL (ref 0.0–0.1)
Basophils Relative: 1 %
Eosinophils Absolute: 0.5 10*3/uL (ref 0.0–0.5)
Eosinophils Relative: 4 %
HEMATOCRIT: 43 % (ref 36.0–46.0)
Hemoglobin: 14.8 g/dL (ref 12.0–15.0)
Immature Granulocytes: 1 %
Lymphocytes Relative: 37 %
Lymphs Abs: 5.3 10*3/uL — ABNORMAL HIGH (ref 0.7–4.0)
MCH: 32.3 pg (ref 26.0–34.0)
MCHC: 34.4 g/dL (ref 30.0–36.0)
MCV: 93.9 fL (ref 80.0–100.0)
MONO ABS: 0.7 10*3/uL (ref 0.1–1.0)
Monocytes Relative: 5 %
Neutro Abs: 7.5 10*3/uL (ref 1.7–7.7)
Neutrophils Relative %: 52 %
Platelets: 290 10*3/uL (ref 150–400)
RBC: 4.58 MIL/uL (ref 3.87–5.11)
RDW: 12.9 % (ref 11.5–15.5)
WBC: 14.2 10*3/uL — ABNORMAL HIGH (ref 4.0–10.5)
nRBC: 0 % (ref 0.0–0.2)

## 2018-07-25 LAB — COMPREHENSIVE METABOLIC PANEL
ALT: 78 U/L — ABNORMAL HIGH (ref 0–44)
AST: 32 U/L (ref 15–41)
Albumin: 5 g/dL (ref 3.5–5.0)
Alkaline Phosphatase: 104 U/L (ref 38–126)
Anion gap: 11 (ref 5–15)
BUN: 14 mg/dL (ref 6–20)
CO2: 20 mmol/L — AB (ref 22–32)
Calcium: 9.4 mg/dL (ref 8.9–10.3)
Chloride: 106 mmol/L (ref 98–111)
Creatinine, Ser: 0.99 mg/dL (ref 0.44–1.00)
GFR calc Af Amer: >60 mL/min (ref >60)
GFR calc non Af Amer: >60 mL/min (ref >60)
Glucose, Bld: 101 mg/dL — ABNORMAL HIGH (ref 70–99)
Potassium: 3.5 mmol/L (ref 3.5–5.1)
Sodium: 137 mmol/L (ref 135–145)
Total Bilirubin: 0.6 mg/dL (ref 0.3–1.2)
Total Protein: 7.7 g/dL (ref 6.5–8.1)

## 2018-07-25 LAB — TROPONIN I
Troponin I: <0.03 ng/mL (ref <0.03)
Troponin I: <0.03 ng/mL (ref <0.03)

## 2018-07-25 LAB — RAPID URINE DRUG SCREEN, HOSP PERFORMED
Amphetamines: NOT DETECTED
Barbiturates: NOT DETECTED
Benzodiazepines: NOT DETECTED
Cocaine: NOT DETECTED
OPIATES: NOT DETECTED
Tetrahydrocannabinol: NOT DETECTED

## 2018-07-25 LAB — PROTIME-INR
INR: 0.89
Prothrombin Time: 12 seconds (ref 11.4–15.2)

## 2018-07-25 LAB — D-DIMER, QUANTITATIVE: D-Dimer, Quant: 0.28 ug/mL-FEU (ref 0.00–0.50)

## 2018-07-25 LAB — BRAIN NATRIURETIC PEPTIDE: B NATRIURETIC PEPTIDE 5: 26 pg/mL (ref 0.0–100.0)

## 2018-07-25 NOTE — ED Provider Notes (Signed)
[facility] Provider Note   CSN: [id number] Arrival date & time: 07/25/18  0150     History   Chief Complaint Chief Complaint  Patient presents with  . Shortness of Breath    HPI [name] is a 48 y.o. female.  HPI 48 year old female comes in with chief complaint of shortness of breath, right-sided upper extremity edema and also left upper extremity and lower extremity weakness and numbness.  Patient has history of ulcerative colitis, IBS, migraines.  She also has about 30-pack-year smoking history.  Patient states that she has been having shortness of breath with exertion over the past 2 or 3 days.  She denies any new cough, fevers, chills, flulike symptoms.  Patient does not have any new chest pain, back pain.  Additionally patient is complaining of right upper extremity edema.  She states that she has had a blood clot in the past in her legs.  She does not take any blood thinners at this time.  She also feels that her left upper extremity and left lower extremity are numb and feeling little weak.  Patient denies any history of strokes but states that she has had a TIA in the past.  Past Medical History:  Diagnosis Date  . Anxiety   . Diverticula of intestine   . IBS (irritable bowel syndrome)   . Migraines   . Thyroid disease   . Ulcerative colitis   . Vertigo     Patient Active Problem List   Diagnosis Date Noted  . MDD (major depressive disorder), recurrent episode, moderate (HCC) 05/06/2018  . Severe recurrent major depression without psychotic features (HCC) 04/06/2018  . Colitis 11/06/2015  . Vomiting and diarrhea 11/06/2015  . GERD (gastroesophageal reflux disease) 11/06/2015  . Thrombocytopenia (HCC) 10/21/2015  . Hypokalemia 10/19/2015  . Hypothyroidism, adult 10/19/2015  . Hypovolemia 10/19/2015  . Enterocolitis 10/18/2015  . Headache in front of head 01/17/2013  . Hyperlipidemia 01/17/2013  . Chest pain 01/16/2013  .  Tobacco abuse 01/16/2013  . Cellulitis of buttock, left 03/14/2012  . Fatty liver 09/01/2011  . Anxiety disorder 09/01/2011  . Abdominal pain 09/01/2011  . Pancreatitis 08/30/2011  . Leukocytosis 08/30/2011    Past Surgical History:  Procedure Laterality Date  . ABDOMINAL HYSTERECTOMY    . APPENDECTOMY    . CHOLECYSTECTOMY    . ESOPHAGOGASTRODUODENOSCOPY N/A 10/23/2015   Procedure: ESOPHAGOGASTRODUODENOSCOPY (EGD);  Surgeon: Malissa HippoNajeeb U Rehman, MD;  [facility];  Service: Endoscopy;  Laterality: N/A;  . FLEXIBLE SIGMOIDOSCOPY N/A 11/07/2015   Procedure: FLEXIBLE SIGMOIDOSCOPY;  Surgeon: Malissa HippoNajeeb U Rehman, MD;  Location: [facility];  Service: Endoscopy;  Laterality: N/A;  . KNEE ARTHROSCOPY       OB History    Gravida      Para      Term      Preterm      AB      Living  3     SAB      TAB      Ectopic      Multiple      Live Births               Home Medications    Prior to Admission medications   Medication Sig Start Date End Date Taking? Authorizing Provider  hydrOXYzine (ATARAX/VISTARIL) 25 MG tablet Take 1 tablet (25 mg total) by mouth daily as needed. 05/06/18   Neysa HotterHisada, Reina, MD  levothyroxine (SYNTHROID, LEVOTHROID) 200 MCG tablet Take  1 tablet (200 mcg total) by mouth daily before breakfast. 04/09/18   Money, Gerlene Burdock, FNP  pantoprazole (PROTONIX) 40 MG tablet Take 40 mg by mouth 2 (two) times daily before a meal.    [provider]  venlafaxine XR (EFFEXOR-XR) 150 MG 24 hr capsule Take total of 225 mg daily (150 mg+ 75 mg) 06/07/18   Neysa Hotter, MD  venlafaxine XR (EFFEXOR-XR) 75 MG 24 hr capsule Take total of 225 mg daily (150 mg+ 75 mg) 06/07/18   Neysa Hotter, MD  zolpidem (AMBIEN) 5 MG tablet Take 1 tablet (5 mg total) by mouth at bedtime as needed for sleep. 06/07/18   Neysa Hotter, MD    Family History Family History  Problem Relation Age of Onset  . Diabetes Mother   . COPD Mother   . Heart disease Mother   .  Anxiety disorder Mother   . Depression Mother   . Breast cancer Paternal Grandmother   . Lung cancer Paternal Grandfather     Social History Social History   Tobacco Use  . Smoking status: Current Every Day Smoker    Packs/day: 1.00    Years: 20.00    Pack years: 20.00    Types: Cigarettes  . Smokeless tobacco: Never Used  Substance Use Topics  . Alcohol use: No    Alcohol/week: 0.0 standard drinks    Comment: socially  . Drug use: No     Allergies   Morphine and related   Review of Systems Review of Systems  Constitutional: Positive for activity change.  Respiratory: Positive for shortness of breath.   Cardiovascular: Negative for chest pain.  Allergic/Immunologic: Negative for immunocompromised state.  Neurological: Positive for weakness and numbness.  All other systems reviewed and are negative.    Physical Exam Updated Vital Signs BP 121/84 (BP Location: Left Arm)   Pulse 64   Temp 98.2 F (36.8 C) (Oral)   Resp 20   Ht 5\' 7"  (1.702 m)   Wt 81.6 kg   SpO2 97%   BMI 28.19 kg/m   Physical Exam Vitals signs and nursing note reviewed.  Constitutional:      Appearance: She is well-developed.  HENT:     Head: Normocephalic and atraumatic.  Eyes:     Extraocular Movements: Extraocular movements intact.     Pupils: Pupils are equal, round, and reactive to light.  Neck:     Musculoskeletal: Normal range of motion and neck supple.  Cardiovascular:     Rate and Rhythm: Normal rate.     Pulses: Normal pulses.  Pulmonary:     Effort: Pulmonary effort is normal.  Abdominal:     General: Bowel sounds are normal.  Musculoskeletal:     Right lower leg: She exhibits no tenderness. No edema.     Left lower leg: She exhibits no tenderness. No edema.  Skin:    General: Skin is warm and dry.  Neurological:     Mental Status: She is alert and oriented to person, place, and time.      ED Treatments / Results  Labs (all labs ordered are listed, but only  abnormal results are displayed) Labs Reviewed  COMPREHENSIVE METABOLIC PANEL - Abnormal; Notable for the following components:      Result Value   CO2 20 (*)    Glucose, Bld 101 (*)    ALT 78 (*)    All other components within normal limits  CBC WITH DIFFERENTIAL/PLATELET - Abnormal; Notable for the following  components:   WBC 14.2 (*)    Lymphs Abs 5.3 (*)    Abs Immature Granulocytes 0.08 (*)    All other components within normal limits  TROPONIN I  BRAIN NATRIURETIC PEPTIDE  PROTIME-INR  RAPID URINE DRUG SCREEN, HOSP PERFORMED  TROPONIN I  D-DIMER, QUANTITATIVE (NOT AT [facility])    EKG EKG Interpretation  Date/Time:  Monday July 25 2018 02:16:05 EST Ventricular Rate:  86 PR Interval:  140 QRS Duration: 80 QT Interval:  420 QTC Calculation: 502 R Axis:   48 Text Interpretation:  Normal sinus rhythm Prolonged QT Abnormal ECG No acute changes t wave flattening Confirmed by Derwood KaplanNanavati, Terianna Peggs [id number]) on 07/25/2018 6:49:22 AM   Radiology Dg Chest 2 View  Result Date: 07/25/2018 CLINICAL DATA:  Swelling to the upper body and face starting a few days ago. Left arm and leg pain. Shortness of breath. EXAM: CHEST - 2 VIEW COMPARISON:  12/30/2016 FINDINGS: The heart size and mediastinal contours are within normal limits. Both lungs are clear. The visualized skeletal structures are unremarkable. IMPRESSION: No active cardiopulmonary disease. Electronically Signed   By: Burman NievesWilliam  Stevens M.D.   On: 07/25/2018 02:27   Ct Head Wo Contrast  Result Date: 07/25/2018 CLINICAL DATA:  Left upper and lower extremity pain and altered sensation EXAM: CT HEAD WITHOUT CONTRAST TECHNIQUE: Contiguous axial images were obtained from the base of the skull through the vertex without intravenous contrast. COMPARISON:  March 01, 2018 3 FINDINGS: Brain: The ventricles are normal in size and configuration. There is no intracranial mass, hemorrhage, extra-axial fluid collection, or midline shift. Brain  parenchyma appears unremarkable. No evident acute infarct. Vascular: No hyperdense vessel. There is mild calcification in the right carotid siphon region. Skull: The bony calvarium appears intact. Sinuses/Orbits: There is mucosal thickening in several ethmoid air cells. Other visualized paranasal sinuses are clear. Visualized orbits appear symmetric bilaterally. Other: Mastoid air cells are clear. IMPRESSION: Brain parenchyma appears unremarkable.  No mass or hemorrhage. There is mild arterial vascular calcification. There is mild mucosal thickening in several ethmoid air cells. Electronically Signed   By: Bretta BangWilliam  Woodruff III M.D.   On: 07/25/2018 07:09    Procedures Procedures (including critical care time)  Medications Ordered in ED Medications - No data to display   Initial Impression / Assessment and Plan / ED Course  I have reviewed the triage vital signs and the nursing notes.  Pertinent labs & imaging results that were available during my care of the patient were reviewed by me and considered in my medical decision making (see chart for details).     48 year old female with 30-pack-year smoking history, ulcerative colitis comes in with chief complaint of shortness of breath with exertion, left-sided numbness and weakness and also right upper extremity swelling.  She also reports history of DVTs and TIA.  Plan is to get ultrasound right upper extremity to ensure there is no DVT because she he is high risk on Wells criteria.  D-dimer ordered to rule out CT PE.  Patient's Wells score and pretest probability is low for PE.  Additionally, with all these vague symptoms and heavy smoking history we considered dissection in the differential.  However patient does not have any tachycardia, chest pain, back pain and has equal radial pulses and femoral pulse bilaterally.  Pretest probability for dissection is also low, to the point where if the d-dimer is negative then I do not think she needs a  CT dissection study.  Finally she is having  some left upper and lower extremity weakness and numbness.  CT scan of the head ordered.  If the CT head is normal then we will get MRI of the brain.  Dr. Juleen China her to follow-up on the MRI results.  Final Clinical Impressions(s) / ED Diagnoses   Final diagnoses:  Edema    ED Discharge Orders    None       Derwood Kaplan, MD 07/25/18 [phone]

## 2018-07-25 NOTE — ED Triage Notes (Signed)
Pt c/o swelling to upper body and face area, sob that started a few days ago along with left side arm and leg pain.

## 2018-07-31 ENCOUNTER — Other Ambulatory Visit: Payer: Self-pay

## 2018-07-31 ENCOUNTER — Encounter (HOSPITAL_COMMUNITY): Payer: Self-pay | Admitting: Emergency Medicine

## 2018-07-31 ENCOUNTER — Emergency Department (HOSPITAL_COMMUNITY)
Admission: EM | Admit: 2018-07-31 | Discharge: 2018-07-31 | Disposition: A | Discharge status: Home / Self Care | Payer: BLUE CROSS/BLUE SHIELD | Attending: Emergency Medicine | Admitting: Emergency Medicine

## 2018-07-31 DIAGNOSIS — R509 Fever, unspecified: Secondary | ICD-10-CM

## 2018-07-31 DIAGNOSIS — Z79899 Other long term (current) drug therapy: Secondary | ICD-10-CM | POA: Insufficient documentation

## 2018-07-31 DIAGNOSIS — E039 Hypothyroidism, unspecified: Secondary | ICD-10-CM | POA: Insufficient documentation

## 2018-07-31 DIAGNOSIS — F17210 Nicotine dependence, cigarettes, uncomplicated: Secondary | ICD-10-CM | POA: Diagnosis not present

## 2018-07-31 DIAGNOSIS — R05 Cough: Secondary | ICD-10-CM | POA: Diagnosis not present

## 2018-07-31 HISTORY — DX: Depression, unspecified: F32.A

## 2018-07-31 HISTORY — DX: Major depressive disorder, single episode, unspecified: F32.9

## 2018-07-31 LAB — COMPREHENSIVE METABOLIC PANEL
ALT: 125 U/L — ABNORMAL HIGH (ref 0–44)
AST: 124 U/L — ABNORMAL HIGH (ref 15–41)
Albumin: 4.8 g/dL (ref 3.5–5.0)
Alkaline Phosphatase: 110 U/L (ref 38–126)
Anion gap: 12 (ref 5–15)
BUN: 9 mg/dL (ref 6–20)
CALCIUM: 9.1 mg/dL (ref 8.9–10.3)
CO2: 20 mmol/L — ABNORMAL LOW (ref 22–32)
Chloride: 105 mmol/L (ref 98–111)
Creatinine, Ser: 1.2 mg/dL — ABNORMAL HIGH (ref 0.44–1.00)
GFR calc Af Amer: >60 mL/min (ref >60)
GFR calc non Af Amer: 55 mL/min — ABNORMAL LOW (ref >60)
Glucose, Bld: 107 mg/dL — ABNORMAL HIGH (ref 70–99)
Potassium: 3 mmol/L — ABNORMAL LOW (ref 3.5–5.1)
Sodium: 137 mmol/L (ref 135–145)
TOTAL PROTEIN: 7.2 g/dL (ref 6.5–8.1)
Total Bilirubin: 0.5 mg/dL (ref 0.3–1.2)

## 2018-07-31 LAB — CBC WITH DIFFERENTIAL/PLATELET
Abs Immature Granulocytes: 0.02 10*3/uL (ref 0.00–0.07)
Basophils Absolute: 0 10*3/uL (ref 0.0–0.1)
Basophils Relative: 1 %
EOS PCT: 1 %
Eosinophils Absolute: 0.1 10*3/uL (ref 0.0–0.5)
HCT: 39.2 % (ref 36.0–46.0)
Hemoglobin: 13.3 g/dL (ref 12.0–15.0)
Immature Granulocytes: 0 %
LYMPHS PCT: 16 %
Lymphs Abs: 0.8 10*3/uL (ref 0.7–4.0)
MCH: 31.9 pg (ref 26.0–34.0)
MCHC: 33.9 g/dL (ref 30.0–36.0)
MCV: 94 fL (ref 80.0–100.0)
Monocytes Absolute: 0.3 10*3/uL (ref 0.1–1.0)
Monocytes Relative: 6 %
Neutro Abs: 3.8 10*3/uL (ref 1.7–7.7)
Neutrophils Relative %: 76 %
Platelets: 177 10*3/uL (ref 150–400)
RBC: 4.17 MIL/uL (ref 3.87–5.11)
RDW: 13.1 % (ref 11.5–15.5)
WBC: 5 10*3/uL (ref 4.0–10.5)
nRBC: 0 % (ref 0.0–0.2)

## 2018-07-31 LAB — URINALYSIS, MICROSCOPIC (REFLEX): Bacteria, UA: NONE SEEN

## 2018-07-31 LAB — URINALYSIS, ROUTINE W REFLEX MICROSCOPIC
BILIRUBIN URINE: NEGATIVE
Glucose, UA: NEGATIVE mg/dL
KETONES UR: NEGATIVE mg/dL
Leukocytes, UA: NEGATIVE
Nitrite: NEGATIVE
PROTEIN: NEGATIVE mg/dL
Specific Gravity, Urine: 1.015 (ref 1.005–1.030)
pH: 6 (ref 5.0–8.0)

## 2018-07-31 LAB — GROUP A STREP BY PCR: Group A Strep by PCR: NOT DETECTED

## 2018-07-31 MED ORDER — ONDANSETRON 4 MG PO TBDP: ORAL_TABLET | ORAL | 0 refills | Status: AC

## 2018-07-31 MED ORDER — KETOROLAC TROMETHAMINE 30 MG/ML IJ SOLN
30.0000 mg | Freq: Once | INTRAMUSCULAR | Status: AC
  Administered 2018-07-31: 30 mg via INTRAVENOUS
  Filled 2018-07-31: qty 1

## 2018-07-31 MED ORDER — POTASSIUM CHLORIDE CRYS ER 20 MEQ PO TBCR
40.0000 meq | EXTENDED_RELEASE_TABLET | Freq: Once | ORAL | Status: AC
  Administered 2018-07-31: 40 meq via ORAL
  Filled 2018-07-31: qty 2

## 2018-07-31 MED ORDER — OXYCODONE-ACETAMINOPHEN 5-325 MG PO TABS
1.0000 | ORAL_TABLET | Freq: Once | ORAL | Status: AC
  Administered 2018-07-31: 1 via ORAL
  Filled 2018-07-31: qty 1

## 2018-07-31 MED ORDER — NAPROXEN 500 MG PO TABS: ORAL_TABLET | ORAL | 0 refills | Status: DC

## 2018-07-31 MED ORDER — DOXYCYCLINE HYCLATE 100 MG PO CAPS: 100.0000 mg | ORAL_CAPSULE | Freq: Two times a day (BID) | ORAL | 0 refills | Status: DC

## 2018-07-31 MED ORDER — ACETAMINOPHEN 325 MG PO TABS
650.0000 mg | ORAL_TABLET | Freq: Once | ORAL | Status: AC | PRN
  Administered 2018-07-31: 650 mg via ORAL
  Filled 2018-07-31: qty 2

## 2018-07-31 MED ORDER — SODIUM CHLORIDE 0.9 % IV BOLUS
1000.0000 mL | Freq: Once | INTRAVENOUS | Status: AC
  Administered 2018-07-31: 1000 mL via INTRAVENOUS

## 2018-07-31 NOTE — ED Provider Notes (Signed)
[facility] Provider [facility]   CSN: [id number] Arrival date & time: 07/31/18  1847     History   Chief Complaint Chief Complaint  Patient presents with  . Fever    HPI [name] is a 48 y.o. female.  Patient complains of cough fever aches  The history is provided by the patient. No language interpreter was used.  Cough  Cough characteristics:  Non-productive Sputum characteristics:  Nondescript Severity:  Moderate Onset quality:  Sudden Timing:  Constant Progression:  Worsening Chronicity:  New Smoker: no   Context: not animal exposure   Relieved by:  Nothing Worsened by:  Nothing Ineffective treatments:  None tried Associated symptoms: no chest pain, no eye discharge, no headaches and no rash     Past Medical History:  Diagnosis Date  . Anxiety   . Depression   . Diverticula of intestine   . IBS (irritable bowel syndrome)   . Migraines   . Thyroid disease   . Ulcerative colitis   . Vertigo     Patient Active Problem List   Diagnosis Date Noted  . MDD (major depressive disorder), recurrent episode, moderate (HCC) 05/06/2018  . Severe recurrent major depression without psychotic features (HCC) 04/06/2018  . Colitis 11/06/2015  . Vomiting and diarrhea 11/06/2015  . GERD (gastroesophageal reflux disease) 11/06/2015  . Thrombocytopenia (HCC) 10/21/2015  . Hypokalemia 10/19/2015  . Hypothyroidism, adult 10/19/2015  . Hypovolemia 10/19/2015  . Enterocolitis 10/18/2015  . Headache in front of head 01/17/2013  . Hyperlipidemia 01/17/2013  . Chest pain 01/16/2013  . Tobacco abuse 01/16/2013  . Cellulitis of buttock, left 03/14/2012  . Fatty liver 09/01/2011  . Anxiety disorder 09/01/2011  . Abdominal pain 09/01/2011  . Pancreatitis 08/30/2011  . Leukocytosis 08/30/2011    Past Surgical History:  Procedure Laterality Date  . ABDOMINAL HYSTERECTOMY    . APPENDECTOMY    . CHOLECYSTECTOMY    . ESOPHAGOGASTRODUODENOSCOPY N/A  10/23/2015   Procedure: ESOPHAGOGASTRODUODENOSCOPY (EGD);  Surgeon: Malissa HippoNajeeb U Rehman, MD;  Location: [facility];  Service: Endoscopy;  Laterality: N/A;  . FLEXIBLE SIGMOIDOSCOPY N/A 11/07/2015   Procedure: FLEXIBLE SIGMOIDOSCOPY;  Surgeon: Malissa HippoNajeeb U Rehman, MD;  [facility]: [facility];  Service: Endoscopy;  Laterality: N/A;  . KNEE ARTHROSCOPY       OB History    Gravida      Para      Term      Preterm      AB      Living  3     SAB      TAB      Ectopic      Multiple      Live Births               Home Medications    Prior to Admission medications   Medication Sig Start Date End Date Taking? Authorizing Provider  levothyroxine (SYNTHROID, LEVOTHROID) 200 MCG tablet Take 1 tablet (200 mcg total) by mouth daily before breakfast. 04/09/18  Yes Money, Gerlene Burdockravis B, FNP  Pseudoeph-Doxylamine-DM-APAP (NYQUIL D COLD/FLU PO) Take 1 tablet by mouth daily.   Yes [provider]  venlafaxine XR (EFFEXOR-XR) 150 MG 24 hr capsule Take total of 225 mg daily (150 mg+ 75 mg) 06/07/18  Yes Hisada, Barbee Cougheina, MD  venlafaxine XR (EFFEXOR-XR) 75 MG 24 hr capsule Take total of 225 mg daily (150 mg+ 75 mg) 06/07/18  Yes Hisada, Reina, MD  doxycycline (VIBRAMYCIN) 100 MG capsule Take 1 capsule (100  mg total) by mouth 2 (two) times daily. One po bid x 7 days 07/31/18   Bethann BerkshireZammit, Chandani Rogowski, MD  hydrOXYzine (ATARAX/VISTARIL) 25 MG tablet Take 1 tablet (25 mg total) by mouth daily as needed. 05/06/18   Neysa HotterHisada, Reina, MD  naproxen (NAPROSYN) 500 MG tablet Take 1 every 12 hours for fevers and aches 07/31/18   Bethann BerkshireZammit, Claus Silvestro, MD  ondansetron (ZOFRAN ODT) 4 MG disintegrating tablet 4mg  ODT q4 hours prn nausea/vomit 07/31/18   Bethann BerkshireZammit, Dorthy Magnussen, MD  pantoprazole (PROTONIX) 40 MG tablet Take 40 mg by mouth 2 (two) times daily before a meal.    [provider]  zolpidem (AMBIEN) 5 MG tablet Take 1 tablet (5 mg total) by mouth at bedtime as needed for sleep. 06/07/18   Neysa HotterHisada, Reina, MD    Family  History Family History  Problem Relation Age of Onset  . Diabetes Mother   . COPD Mother   . Heart disease Mother   . Anxiety disorder Mother   . Depression Mother   . Breast cancer Paternal Grandmother   . Lung cancer Paternal Grandfather     Social History Social History   Tobacco Use  . Smoking status: Current Every Day Smoker    Packs/day: 1.00    Years: 20.00    Pack years: 20.00    Types: Cigarettes  . Smokeless tobacco: Never Used  Substance Use Topics  . Alcohol use: No    Alcohol/week: 0.0 standard drinks    Comment: socially  . Drug use: No     Allergies   Morphine and related   Review of Systems Review of Systems  Constitutional: Negative for appetite change and fatigue.  HENT: Negative for congestion, ear discharge and sinus pressure.   Eyes: Negative for discharge.  Respiratory: Positive for cough.   Cardiovascular: Negative for chest pain.  Gastrointestinal: Negative for abdominal pain and diarrhea.  Genitourinary: Negative for frequency and hematuria.  Musculoskeletal: Negative for back pain.  Skin: Negative for rash.  Neurological: Negative for seizures and headaches.  Psychiatric/Behavioral: Negative for hallucinations.     Physical Exam Updated Vital Signs BP 120/78 (BP Location: Right Arm)   Pulse (!) 23   Temp (!) 102.7 F (39.3 C) (Oral)   Resp 20   Ht 5\' 6"  (1.676 m)   Wt 81.6 kg   SpO2 97%   BMI 29.05 kg/m   Physical Exam Vitals signs and nursing note reviewed.  Constitutional:      Appearance: She is well-developed.  HENT:     Head: Normocephalic.     Nose: Nose normal.  Eyes:     General: No scleral icterus.    Conjunctiva/sclera: Conjunctivae normal.  Neck:     Musculoskeletal: Neck supple.     Thyroid: No thyromegaly.  Cardiovascular:     Rate and Rhythm: Normal rate and regular rhythm.     Heart sounds: No murmur. No friction rub. No gallop.   Pulmonary:     Breath sounds: No stridor. No wheezing or rales.    Chest:     Chest wall: No tenderness.  Abdominal:     General: There is no distension.     Tenderness: There is no abdominal tenderness. There is no rebound.  Musculoskeletal: Normal range of motion.  Lymphadenopathy:     Cervical: No cervical adenopathy.  Skin:    Findings: No erythema or rash.  Neurological:     Mental Status: She is oriented to person, place, and time.  Motor: No abnormal muscle tone.     Coordination: Coordination normal.  Psychiatric:        Behavior: Behavior normal.      ED Treatments / Results  Labs (all labs ordered are listed, but only abnormal results are displayed) Labs Reviewed  COMPREHENSIVE METABOLIC PANEL - Abnormal; Notable for the following components:      Result Value   Potassium 3.0 (*)    CO2 20 (*)    Glucose, Bld 107 (*)    Creatinine, Ser 1.20 (*)    AST 124 (*)    ALT 125 (*)    GFR calc non Af Amer 55 (*)    All other components within normal limits  URINALYSIS, ROUTINE W REFLEX MICROSCOPIC - Abnormal; Notable for the following components:   Hgb urine dipstick TRACE (*)    All other components within normal limits  GROUP A STREP BY PCR  CBC WITH DIFFERENTIAL/PLATELET  URINALYSIS, MICROSCOPIC (REFLEX)    EKG None  Radiology No results found.  Procedures Procedures (including critical care time)  Medications Ordered in ED Medications  oxyCODONE-acetaminophen (PERCOCET/ROXICET) 5-325 MG per tablet 1 tablet (has no administration in time range)  potassium chloride SA (K-DUR,KLOR-CON) CR tablet 40 mEq (has no administration in time range)  acetaminophen (TYLENOL) tablet 650 mg (650 mg Oral Given 07/31/18 1911)  sodium chloride 0.9 % bolus 1,000 mL (1,000 mLs Intravenous New Bag/Given 07/31/18 1949)  ketorolac (TORADOL) 30 MG/ML injection 30 mg (30 mg Intravenous Given 07/31/18 1949)     Initial Impression / Assessment and Plan / ED Course  I have reviewed the triage vital signs and the nursing notes.  Pertinent labs  & imaging results that were available during my care of the patient were reviewed by me and considered in my medical decision making (see chart for details).     Labs unremarkable.  Patient will be treated with doxycycline for possible sinus and bronchitis.  She is also given Zofran and Naprosyn and will follow-up as needed Final Clinical Impressions(s) / ED Diagnoses   Final diagnoses:  Febrile illness    ED Discharge Orders         Ordered    doxycycline (VIBRAMYCIN) 100 MG capsule  2 times daily     07/31/18 2140    ondansetron (ZOFRAN ODT) 4 MG disintegrating tablet     07/31/18 2140    naproxen (NAPROSYN) 500 MG tablet     07/31/18 2140           Bethann Berkshire, MD 07/31/18 2142

## 2018-07-31 NOTE — ED Triage Notes (Signed)
Fever since about 0600  Report fever of 102 at home   Has had flu and pneumonia shot

## 2018-07-31 NOTE — Discharge Instructions (Addendum)
Drink plenty of fluids.  Follow-up with your doctor if not getting better next couple days
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
## 2018-08-08 ENCOUNTER — Ambulatory Visit (INDEPENDENT_AMBULATORY_CARE_PROVIDER_SITE_OTHER): Payer: BLUE CROSS/BLUE SHIELD | Admitting: Psychiatry

## 2018-08-08 ENCOUNTER — Encounter (HOSPITAL_COMMUNITY): Payer: Self-pay | Admitting: Psychiatry

## 2018-08-08 DIAGNOSIS — F331 Major depressive disorder, recurrent, moderate: Secondary | ICD-10-CM | POA: Diagnosis not present

## 2018-08-08 MED ORDER — BUPROPION HCL ER (XL) 150 MG PO TB24: 150.0000 mg | ORAL_TABLET | Freq: Every day | ORAL | 1 refills | Status: DC

## 2018-08-08 MED ORDER — VENLAFAXINE HCL ER 150 MG PO CP24: ORAL_CAPSULE | ORAL | 0 refills | Status: DC

## 2018-08-08 MED ORDER — VENLAFAXINE HCL ER 75 MG PO CP24: ORAL_CAPSULE | ORAL | 0 refills | Status: DC

## 2018-08-08 NOTE — Patient Instructions (Signed)
1. Continue Effexor 225 mg daily  2. Start bupropion 150 mg daily  3. Discontinue Ambien 4. Return to clinic in two months for 15 mins 5. Referral to therapy

## 2018-09-05 ENCOUNTER — Ambulatory Visit (HOSPITAL_COMMUNITY): Payer: BLUE CROSS/BLUE SHIELD | Admitting: Licensed Clinical Social Worker
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
## 2018-10-10 ENCOUNTER — Ambulatory Visit (INDEPENDENT_AMBULATORY_CARE_PROVIDER_SITE_OTHER): Payer: BLUE CROSS/BLUE SHIELD | Admitting: Psychiatry

## 2018-10-10 ENCOUNTER — Other Ambulatory Visit: Payer: Self-pay

## 2018-10-10 ENCOUNTER — Encounter (HOSPITAL_COMMUNITY): Payer: Self-pay | Admitting: Psychiatry

## 2018-10-10 DIAGNOSIS — F331 Major depressive disorder, recurrent, moderate: Secondary | ICD-10-CM

## 2018-10-10 MED ORDER — VENLAFAXINE HCL ER 75 MG PO CP24: ORAL_CAPSULE | ORAL | 0 refills | Status: DC

## 2018-10-10 MED ORDER — BUPROPION HCL ER (XL) 300 MG PO TB24: 300.0000 mg | ORAL_TABLET | Freq: Every day | ORAL | 1 refills | Status: DC

## 2018-10-10 MED ORDER — VENLAFAXINE HCL ER 150 MG PO CP24: ORAL_CAPSULE | ORAL | 0 refills | Status: DC

## 2018-11-03 ENCOUNTER — Ambulatory Visit (HOSPITAL_COMMUNITY): Payer: BLUE CROSS/BLUE SHIELD | Admitting: Licensed Clinical Social Worker
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
## 2018-12-05 ENCOUNTER — Encounter (HOSPITAL_COMMUNITY): Payer: Self-pay | Admitting: Psychiatry

## 2018-12-05 ENCOUNTER — Other Ambulatory Visit: Payer: Self-pay

## 2018-12-05 ENCOUNTER — Ambulatory Visit (INDEPENDENT_AMBULATORY_CARE_PROVIDER_SITE_OTHER): Payer: BC Managed Care – PPO | Admitting: Psychiatry

## 2018-12-05 DIAGNOSIS — F330 Major depressive disorder, recurrent, mild: Secondary | ICD-10-CM

## 2018-12-05 DIAGNOSIS — F331 Major depressive disorder, recurrent, moderate: Secondary | ICD-10-CM

## 2018-12-05 MED ORDER — VENLAFAXINE HCL ER 150 MG PO CP24: ORAL_CAPSULE | ORAL | 0 refills | Status: DC

## 2018-12-05 MED ORDER — VENLAFAXINE HCL ER 75 MG PO CP24: ORAL_CAPSULE | ORAL | 0 refills | Status: DC

## 2018-12-05 MED ORDER — BUPROPION HCL ER (XL) 300 MG PO TB24: ORAL_TABLET | ORAL | 1 refills | Status: DC

## 2018-12-05 MED ORDER — BUPROPION HCL ER (XL) 150 MG PO TB24: ORAL_TABLET | ORAL | 1 refills | Status: DC

## 2018-12-05 NOTE — Patient Instructions (Signed)
1.Continuevenlafaxine225mg  daily 2. Increase bupropion 450 mg daily (300 mg + 150 mg)  4.Next appointment: 8/10 at 10:40

## 2018-12-15 ENCOUNTER — Encounter (HOSPITAL_COMMUNITY): Payer: Self-pay | Admitting: Licensed Clinical Social Worker

## 2018-12-15 ENCOUNTER — Other Ambulatory Visit: Payer: Self-pay

## 2018-12-15 ENCOUNTER — Ambulatory Visit (INDEPENDENT_AMBULATORY_CARE_PROVIDER_SITE_OTHER): Payer: BC Managed Care – PPO | Admitting: Licensed Clinical Social Worker

## 2018-12-15 DIAGNOSIS — F330 Major depressive disorder, recurrent, mild: Secondary | ICD-10-CM
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
## 2018-12-29 ENCOUNTER — Other Ambulatory Visit: Payer: Self-pay

## 2018-12-29 ENCOUNTER — Encounter (HOSPITAL_COMMUNITY): Payer: Self-pay | Admitting: Psychiatry

## 2018-12-29 ENCOUNTER — Ambulatory Visit (INDEPENDENT_AMBULATORY_CARE_PROVIDER_SITE_OTHER): Payer: BC Managed Care – PPO | Admitting: Psychiatry

## 2018-12-29 DIAGNOSIS — G4700 Insomnia, unspecified: Secondary | ICD-10-CM | POA: Diagnosis not present

## 2018-12-29 DIAGNOSIS — F419 Anxiety disorder, unspecified: Secondary | ICD-10-CM | POA: Diagnosis not present

## 2018-12-29 DIAGNOSIS — F330 Major depressive disorder, recurrent, mild: Secondary | ICD-10-CM

## 2018-12-29 MED ORDER — HYDROXYZINE HCL 25 MG PO TABS: 25.0000 mg | ORAL_TABLET | Freq: Every day | ORAL | 0 refills | Status: DC | PRN

## 2018-12-29 NOTE — Patient Instructions (Signed)
1.Continuevenlafaxine225mg  daily 2.Continue bupropion 450 mg daily (300 mg + 150 mg)  3. Continue hydroxyzine 25 mg daily as needed for anxiety 4.Next appointment: 8/6 at 9:30
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
## 2019-01-26 ENCOUNTER — Other Ambulatory Visit: Payer: Self-pay

## 2019-01-26 ENCOUNTER — Encounter (HOSPITAL_COMMUNITY): Payer: Self-pay | Admitting: Psychiatry

## 2019-01-26 ENCOUNTER — Ambulatory Visit (INDEPENDENT_AMBULATORY_CARE_PROVIDER_SITE_OTHER): Payer: BC Managed Care – PPO | Admitting: Psychiatry

## 2019-01-26 DIAGNOSIS — F330 Major depressive disorder, recurrent, mild: Secondary | ICD-10-CM | POA: Diagnosis not present

## 2019-01-26 DIAGNOSIS — G4700 Insomnia, unspecified: Secondary | ICD-10-CM

## 2019-01-26 DIAGNOSIS — F419 Anxiety disorder, unspecified: Secondary | ICD-10-CM | POA: Diagnosis not present

## 2019-01-26 MED ORDER — BUPROPION HCL ER (XL) 150 MG PO TB24: ORAL_TABLET | ORAL | 2 refills | Status: DC

## 2019-01-26 MED ORDER — BUPROPION HCL ER (XL) 300 MG PO TB24: ORAL_TABLET | ORAL | 2 refills | Status: DC

## 2019-01-26 NOTE — Patient Instructions (Signed)
1.Continuevenlafaxine225mg  daily 2.Continue bupropion450 mg daily(300 mg + 150 mg) 3. Hold hydroxyzine 4.Next appointment: 10/15 at 1 PM

## 2019-01-30 ENCOUNTER — Ambulatory Visit (HOSPITAL_COMMUNITY): Payer: BC Managed Care – PPO | Admitting: Psychiatry

## 2019-02-03 ENCOUNTER — Ambulatory Visit (HOSPITAL_COMMUNITY): Payer: BC Managed Care – PPO | Admitting: Licensed Clinical Social Worker

## 2019-02-03 ENCOUNTER — Other Ambulatory Visit: Payer: Self-pay

## 2019-02-22 ENCOUNTER — Other Ambulatory Visit: Payer: Self-pay | Admitting: Pulmonary Disease

## 2019-02-22 DIAGNOSIS — G4700 Insomnia, unspecified: Secondary | ICD-10-CM | POA: Diagnosis not present

## 2019-02-22 DIAGNOSIS — E039 Hypothyroidism, unspecified: Secondary | ICD-10-CM | POA: Diagnosis not present

## 2019-02-22 DIAGNOSIS — F322 Major depressive disorder, single episode, severe without psychotic features: Secondary | ICD-10-CM | POA: Diagnosis not present

## 2019-02-22 DIAGNOSIS — R1907 Generalized intra-abdominal and pelvic swelling, mass and lump: Secondary | ICD-10-CM

## 2019-02-22 DIAGNOSIS — R609 Edema, unspecified: Secondary | ICD-10-CM | POA: Diagnosis not present

## 2019-02-28 ENCOUNTER — Ambulatory Visit (HOSPITAL_COMMUNITY)
Admission: RE | Admit: 2019-02-28 | Discharge: 2019-02-28 | Disposition: A | Discharge status: Home / Self Care | Payer: BC Managed Care – PPO | Source: Ambulatory Visit | Attending: Pulmonary Disease | Admitting: Pulmonary Disease

## 2019-02-28 ENCOUNTER — Other Ambulatory Visit: Payer: Self-pay

## 2019-02-28 DIAGNOSIS — R1907 Generalized intra-abdominal and pelvic swelling, mass and lump: Secondary | ICD-10-CM | POA: Diagnosis not present

## 2019-02-28 DIAGNOSIS — G4700 Insomnia, unspecified: Secondary | ICD-10-CM | POA: Diagnosis not present

## 2019-02-28 DIAGNOSIS — R609 Edema, unspecified: Secondary | ICD-10-CM | POA: Diagnosis not present

## 2019-02-28 DIAGNOSIS — R1900 Intra-abdominal and pelvic swelling, mass and lump, unspecified site: Secondary | ICD-10-CM | POA: Diagnosis not present

## 2019-02-28 DIAGNOSIS — E039 Hypothyroidism, unspecified: Secondary | ICD-10-CM | POA: Diagnosis not present

## 2019-02-28 DIAGNOSIS — F322 Major depressive disorder, single episode, severe without psychotic features: Secondary | ICD-10-CM | POA: Diagnosis not present

## 2019-03-21 ENCOUNTER — Other Ambulatory Visit (HOSPITAL_COMMUNITY): Payer: Self-pay | Admitting: Pulmonary Disease

## 2019-03-21 DIAGNOSIS — R609 Edema, unspecified: Secondary | ICD-10-CM
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
## 2019-04-02 NOTE — Progress Notes (Deleted)
CARDIOLOGY CONSULT NOTE       Patient ID: [name] MRN: [id number] DOB/AGE: [birth year] 48 y.o.  Admit date: (Not on file) Referring Physician: Luan Pulling Primary Physician: Sinda Du, MD Primary Cardiologist: New Reason for Consultation: Edema  Active Problems:   * No active hospital problems. *   HPI:  48 y.o. referred by Dr Luan Pulling for edema. She has history of depression evaluated by psychiatry. Stress involves personal relationships broke up with boyfriend, grandchildren and grandfathers death Panic attacks She is on Effexor and Welbutrin Also history of low thyroid Echo ordered 9/29 still pending Has had some abdominal swelling US done 02/28/19 was normal Complained of RUE swelling 07/2018 and [place] with no DVT Noted on labs 07/31/18 LFTS elevated Last TSH in Epic 03/01/18 TSH suppressed at 0.193   Reviewed Dr Luan Pulling office note from 02/22/19  Patient complained of swelling in feet ankles and lower legs in addition to abdomen Denies excess salt intake   ***  ROS All other systems reviewed and negative except as noted above  Past Medical History:  Diagnosis Date  . Anxiety   . Depression   . Diverticula of intestine   . IBS (irritable bowel syndrome)   . Migraines   . Thyroid disease   . Ulcerative colitis   . Vertigo     Family History  Problem Relation Age of Onset  . Diabetes Mother   . COPD Mother   . Heart disease Mother   . Anxiety disorder Mother   . Depression Mother   . Breast cancer Paternal Grandmother   . Lung cancer Paternal Grandfather     Social History   Socioeconomic History  . Marital status: Legally Separated    Spouse name: Not on file  . Number of children: Not on file  . Years of education: Not on file  . Highest education level: Not on file  Occupational History  . Not on file  Social Needs  . Financial resource strain: Not on file  . Food insecurity    Worry: Not on file    Inability: Not on file  . Transportation needs   Medical: Not on file    Non-medical: Not on file  Tobacco Use  . Smoking status: Current Every Day Smoker    Packs/day: 1.00    Years: 20.00    Pack years: 20.00    Types: Cigarettes  . Smokeless tobacco: Never Used  Substance and Sexual Activity  . Alcohol use: No    Alcohol/week: 0.0 standard drinks    Comment: socially  . Drug use: No  . Sexual activity: Yes    Birth control/protection: Surgical  Lifestyle  . Physical activity    Days per week: Not on file    Minutes per session: Not on file  . Stress: Not on file  Relationships  . Social Herbalist on phone: Not on file    Gets together: Not on file    Attends religious service: Not on file    Active member of club or organization: Not on file    Attends meetings of clubs or organizations: Not on file    Relationship status: Not on file  . Intimate partner violence    Fear of current or ex partner: Not on file    Emotionally abused: Not on file    Physically abused: Not on file    Forced sexual activity: Not on file  Other Topics Concern  . Not on file  Social History Narrative  . Not on file    Past Surgical History:  Procedure Laterality Date  . ABDOMINAL HYSTERECTOMY    . APPENDECTOMY    . CHOLECYSTECTOMY    . ESOPHAGOGASTRODUODENOSCOPY N/A 10/23/2015   Procedure: ESOPHAGOGASTRODUODENOSCOPY (EGD);  Surgeon: Malissa Hippo, MD;  [facility];  Service: Endoscopy;  Laterality: N/A;  . FLEXIBLE SIGMOIDOSCOPY N/A 11/07/2015   Procedure: FLEXIBLE SIGMOIDOSCOPY;  Surgeon: Malissa Hippo, MD;  [facility];  Service: Endoscopy;  Laterality: N/A;  . KNEE ARTHROSCOPY          Physical Exam: There were no vitals taken for this visit.    Affect appropriate Healthy:  appears stated age HEENT: normal Neck supple with no adenopathy JVP normal no bruits no thyromegaly Lungs clear with no wheezing and good diaphragmatic motion Heart:  S1/S2 no murmur, no rub, gallop or click PMI  normal Abdomen: benighn, BS positve, no tenderness, no AAA no bruit.  No HSM or HJR Distal pulses intact with no bruits No edema Neuro non-focal Skin warm and dry No muscular weakness   Labs:   Lab Results  Component Value Date   WBC 5.0 07/31/2018   HGB 13.3 07/31/2018   HCT 39.2 07/31/2018   MCV 94.0 07/31/2018   PLT 177 07/31/2018   No results for input(s): NA, K, CL, CO2, BUN, CREATININE, CALCIUM, PROT, BILITOT, ALKPHOS, ALT, AST, GLUCOSE in the last 168 hours.  Invalid input(s): LABALBU Lab Results  Component Value Date   CKTOTAL 110 07/12/2007   CKMB 0.6 07/12/2007   TROPONINI <0.03 07/25/2018    Lab Results  Component Value Date   CHOL 197 01/17/2013   CHOL 164 08/31/2011   CHOL  07/12/2007    186        ATP III CLASSIFICATION:  <200     mg/dL   Desirable  169-678  mg/dL   Borderline High  >=938    mg/dL   High   Lab Results  Component Value Date   HDL 32 (L) 01/17/2013   HDL 33 (L) 08/31/2011   HDL 25 (L) 07/12/2007   Lab Results  Component Value Date   LDLCALC 131 (H) 01/17/2013   LDLCALC 102 (H) 08/31/2011   LDLCALC (H) 07/12/2007    107        Total Cholesterol/HDL:CHD Risk Coronary Heart Disease Risk Table                     Men   Women  1/2 Average Risk   3.4   3.3   Lab Results  Component Value Date   TRIG 171 (H) 01/17/2013   TRIG 143 08/31/2011   TRIG 270 (H) 07/12/2007   Lab Results  Component Value Date   CHOLHDL 6.2 01/17/2013   CHOLHDL 5.0 08/31/2011   CHOLHDL 7.4 07/12/2007   No results found for: LDLDIRECT    Radiology: No results found.  EKG: 07/25/18 SR rate 83 normal    ASSESSMENT AND PLAN:   1. Thyroid:  On synthroid labs with primary  2. Anxiety/depression: on multiple meds f/u psychiatry  3. Edema:  Etiology not clear check echo for RV/LV function , BMET, TSH and BNP will also assess LE venous structure r/o reflux  F/u pending results of testing   Signed: Charlton Haws 04/02/2019, 10:04 AM

## 2019-04-04 ENCOUNTER — Ambulatory Visit: Payer: BC Managed Care – PPO | Admitting: Cardiovascular Disease

## 2019-04-04 ENCOUNTER — Other Ambulatory Visit (HOSPITAL_COMMUNITY): Payer: BC Managed Care – PPO

## 2019-04-06 ENCOUNTER — Other Ambulatory Visit: Payer: Self-pay

## 2019-04-06 ENCOUNTER — Ambulatory Visit (INDEPENDENT_AMBULATORY_CARE_PROVIDER_SITE_OTHER): Payer: BC Managed Care – PPO | Admitting: Psychiatry

## 2019-04-06 ENCOUNTER — Encounter (HOSPITAL_COMMUNITY): Payer: Self-pay | Admitting: Psychiatry

## 2019-04-06 DIAGNOSIS — F330 Major depressive disorder, recurrent, mild: Secondary | ICD-10-CM

## 2019-04-06 MED ORDER — BUPROPION HCL ER (XL) 300 MG PO TB24: ORAL_TABLET | ORAL | 2 refills | Status: DC

## 2019-04-06 MED ORDER — VENLAFAXINE HCL ER 150 MG PO CP24: ORAL_CAPSULE | ORAL | 3 refills | Status: DC

## 2019-04-06 MED ORDER — BUPROPION HCL ER (XL) 150 MG PO TB24: ORAL_TABLET | ORAL | 2 refills | Status: DC

## 2019-04-06 MED ORDER — VENLAFAXINE HCL ER 75 MG PO CP24: ORAL_CAPSULE | ORAL | 3 refills | Status: DC

## 2019-04-12 ENCOUNTER — Ambulatory Visit (HOSPITAL_COMMUNITY): Payer: BC Managed Care – PPO | Attending: Pulmonary Disease

## 2019-05-22 ENCOUNTER — Other Ambulatory Visit: Payer: Self-pay

## 2019-05-22 ENCOUNTER — Ambulatory Visit (INDEPENDENT_AMBULATORY_CARE_PROVIDER_SITE_OTHER): Payer: BC Managed Care – PPO | Admitting: Licensed Clinical Social Worker

## 2019-05-22 DIAGNOSIS — F330 Major depressive disorder, recurrent, mild: Secondary | ICD-10-CM | POA: Diagnosis not present

## 2019-05-23 NOTE — Progress Notes (Signed)
Virtual Visit via Video Note  I connected with [name] on 05/23/19 at  1:00 PM EST by a video enabled telemedicine application and verified that I am speaking with the correct person using two identifiers.  Location: Patient: Home Provider: Office   I discussed the limitations of evaluation and management by telemedicine and the availability of in person appointments. The patient expressed understanding and agreed to proceed.  THERAPIST PROGRESS NOTE  Session Time: 1:00 pm-1:45 pm  Participation Level: Active  Behavioral Response: CasualAlertAnxious  Type of Therapy: Individual Therapy  Treatment Goals addressed: Coping  Interventions: CBT and Solution Focused  Summary: [name] is a 48 y.o. female who presents oriented x5 (person, place, situation, time and object), casually dressed, appropriately groomed, average height, average weight, and cooperative to address mood. Patient has a history of medical treatment including hypothyroidism and GERD. Patient has minimal history of mental health treatment including medication management. Patient denies suicidal and homicidal ideations. Patient denies psychosis including auditory and visual hallucinations. Patient denies substance abuse. Patient is at low risk of lethality.   Physically: Patient continues to struggle with sleep. After discussion, patient understood that there are behavioral changes she could make such as keeping the bed for sex or sleep and watching tv outside of her bedroom. Patient has had bed dreams usually associated with not being able to help others that cause her distress.  Spiritually/vlaues: None reported  Relationships: Patient's relationships are going well. She has got back with her ex husband after a period of separation. Patient has questions about the relationship her husband had when they were separated and it causes her to get irritable at times. Patient is trying to accept they were  separated, and accept he had relationships. Patient feels like having the details of the relationship could be worse than what her mind is making up.   Emotional/Mental/Behavior: Patient's mood is stable. She has moments of anxiety especially at bed time. Patient agreed to write out her worries as well as her concerns out on paper prior to bed time. Patient had considered doing this before and burning the paper to literally and symbolically get rid of the thoughts. Patient agreed to write out her feelings and dispose of the worries.   Patient engaged in session. She responded well to interventions. Patient continues to meet criteria for Major depressive disorder, recurrent episode, mild with anxious distress. Patient will continue in outpatient therapy due to being the least restrictive service to meet her needs. Patient made minimal progress on her goals.   Suicidal/Homicidal: Negativewithout intent/plan  Therapist Response: Therapist reviewed patient's recent thoughts and behaviors. Therapist utilized CBT to address mood and anxiety. Therapist processed patient's feelings to identify triggers for mood and anxiety. Therapist had patient identify what has gone well between session, provided psychoeducation on sleep, and discussed acceptance and being present.   Plan: Return again in 4 weeks.  Diagnosis: Axis I: [place] depressive disorder, recurrent episode, mild with anxious distress    Axis II: No diagnosis    I discussed the assessment and treatment plan with the patient. The patient was provided an opportunity to ask questions and all were answered. The patient agreed with the plan and demonstrated an understanding of the instructions.   The patient was advised to call back or seek an in-person evaluation if the symptoms worsen or if the condition fails to improve as anticipated.  I provided 45 minutes of non-face-to-face time during this encounter.   Glori Bickers, LCSW 05/23/2019

## 2019-06-26 ENCOUNTER — Other Ambulatory Visit: Payer: Self-pay

## 2019-06-26 ENCOUNTER — Emergency Department (HOSPITAL_COMMUNITY)
Admission: EM | Admit: 2019-06-26 | Discharge: 2019-06-26 | Discharge status: Against Medical Advice | Payer: BC Managed Care – PPO

## 2019-06-26 NOTE — ED Notes (Signed)
Called to triage with no answer.

## 2019-06-26 NOTE — ED Notes (Signed)
No answer

## 2019-06-27 DIAGNOSIS — R21 Rash and other nonspecific skin eruption: Secondary | ICD-10-CM | POA: Diagnosis not present

## 2019-06-27 DIAGNOSIS — L299 Pruritus, unspecified: Secondary | ICD-10-CM | POA: Diagnosis not present

## 2019-06-27 DIAGNOSIS — D692 Other nonthrombocytopenic purpura: Secondary | ICD-10-CM | POA: Diagnosis not present

## 2019-06-30 ENCOUNTER — Telehealth (HOSPITAL_COMMUNITY): Payer: Self-pay | Admitting: Licensed Clinical Social Worker

## 2019-06-30 ENCOUNTER — Ambulatory Visit (HOSPITAL_COMMUNITY): Payer: BC Managed Care – PPO | Admitting: Licensed Clinical Social Worker

## 2019-06-30 NOTE — Telephone Encounter (Signed)
I called patient at 9am. They answered, I said hello. They hung up. I call back immediately, they did not answer and the VM was full.

## 2019-07-05 DIAGNOSIS — Z131 Encounter for screening for diabetes mellitus: Secondary | ICD-10-CM | POA: Diagnosis not present

## 2019-07-05 DIAGNOSIS — R233 Spontaneous ecchymoses: Secondary | ICD-10-CM | POA: Diagnosis not present

## 2019-07-05 DIAGNOSIS — G43911 Migraine, unspecified, intractable, with status migrainosus: Secondary | ICD-10-CM | POA: Diagnosis not present

## 2019-07-05 DIAGNOSIS — K5900 Constipation, unspecified: Secondary | ICD-10-CM | POA: Diagnosis not present

## 2019-07-05 DIAGNOSIS — E038 Other specified hypothyroidism: Secondary | ICD-10-CM | POA: Diagnosis not present

## 2019-07-05 DIAGNOSIS — F3175 Bipolar disorder, in partial remission, most recent episode depressed: Secondary | ICD-10-CM | POA: Diagnosis not present

## 2019-07-05 DIAGNOSIS — E782 Mixed hyperlipidemia: Secondary | ICD-10-CM | POA: Diagnosis not present

## 2019-07-06 ENCOUNTER — Ambulatory Visit (HOSPITAL_COMMUNITY): Payer: BC Managed Care – PPO | Admitting: Psychiatry

## 2019-07-24 ENCOUNTER — Other Ambulatory Visit: Payer: Self-pay

## 2019-07-24 ENCOUNTER — Encounter: Payer: Self-pay | Admitting: Psychiatry

## 2019-07-24 ENCOUNTER — Ambulatory Visit (INDEPENDENT_AMBULATORY_CARE_PROVIDER_SITE_OTHER): Payer: BC Managed Care – PPO | Admitting: Psychiatry

## 2019-07-24 DIAGNOSIS — F331 Major depressive disorder, recurrent, moderate: Secondary | ICD-10-CM

## 2019-07-24 DIAGNOSIS — F419 Anxiety disorder, unspecified: Secondary | ICD-10-CM | POA: Diagnosis not present

## 2019-07-24 DIAGNOSIS — F330 Major depressive disorder, recurrent, mild: Secondary | ICD-10-CM

## 2019-07-24 MED ORDER — BUPROPION HCL ER (XL) 150 MG PO TB24: ORAL_TABLET | ORAL | 1 refills | Status: DC

## 2019-07-24 MED ORDER — VENLAFAXINE HCL ER 75 MG PO CP24: ORAL_CAPSULE | ORAL | 1 refills | Status: DC

## 2019-07-24 MED ORDER — VENLAFAXINE HCL ER 150 MG PO CP24: ORAL_CAPSULE | ORAL | 1 refills | Status: DC

## 2019-07-24 MED ORDER — BUPROPION HCL ER (XL) 300 MG PO TB24: ORAL_TABLET | ORAL | 1 refills | Status: DC

## 2019-07-24 MED ORDER — ALPRAZOLAM 0.5 MG PO TABS: 0.5000 mg | ORAL_TABLET | Freq: Two times a day (BID) | ORAL | 1 refills | Status: DC | PRN

## 2019-07-24 NOTE — Progress Notes (Signed)
BH MD OP Progress Note  Virtual Visit via Telephone Note  I connected with [name] on 07/24/19 at  1:00 PM EST by telephone and verified that I am speaking with the correct person using two identifiers.   I discussed the limitations, risks, security and privacy concerns of performing an evaluation and management service by telephone and the availability of in person appointments. I also discussed with the patient that there may be a patient responsible charge related to this service. The patient expressed understanding and agreed to proceed.    07/24/2019 1:07 PM [name]  MRN:  [id number]  Chief Complaint:  " I am not doing well at all."   HPI: Patient reported that she is under a lot of stress and pressure right now. She cried briefly over the phone and stated that her mom was recently diagnosed with vascular dementia. She also informed that she is in charge of taking care of her three grandchildren and their home schooling has been very overwhelming. The oldest one who is 47 years old was recently diagnosed with ADHD and helping him with his class work and homework has been even more stressful. She stated that she has been feeling very anxious and having panic attacks. She also stated that there were no refills left on Wellbutrin so she was not able to fill the prescription recently. She stated that she has a lot on her plate and she is trying very hard to raise her grandchildren. She informed that her daughter is incarcerated and will not be coming out anytime soon so she is going to have her three grandchildren for a while with her. She reported poor appetite and poor sleep. As per EMR, she used to be on Xanax until October of last year. She stated that the dose of Xanax that she was prescribed was not really helpful, she was willing to retry a little higher dose of Xanax 0.5 mg twice a day. She denied any suicidal ideations or intention.  Visit Diagnosis:    ICD-10-CM   1.  Moderate episode of recurrent major depressive disorder (HCC)  F33.1   2. Anxiety  F41.9     Past Psychiatric History: depression, anxiety  Past Medical History:  Past Medical History:  Diagnosis Date  . Anxiety   . Depression   . Diverticula of intestine   . IBS (irritable bowel syndrome)   . Migraines   . Thyroid disease   . Ulcerative colitis   . Vertigo     Past Surgical History:  Procedure Laterality Date  . ABDOMINAL HYSTERECTOMY    . APPENDECTOMY    . CHOLECYSTECTOMY    . ESOPHAGOGASTRODUODENOSCOPY N/A 10/23/2015   Procedure: ESOPHAGOGASTRODUODENOSCOPY (EGD);  Surgeon: Malissa Hippo, MD;  Location: [facility];  Service: Endoscopy;  Laterality: N/A;  . FLEXIBLE SIGMOIDOSCOPY N/A 11/07/2015   Procedure: FLEXIBLE SIGMOIDOSCOPY;  Surgeon: Malissa Hippo, MD;  Location: [facility];  Service: Endoscopy;  Laterality: N/A;  . KNEE ARTHROSCOPY      Family Psychiatric History: see below  Family History:  Family History  Problem Relation Age of Onset  . Diabetes Mother   . COPD Mother   . Heart disease Mother   . Anxiety disorder Mother   . Depression Mother   . Breast cancer Paternal Grandmother   . Lung cancer Paternal Grandfather     Social History:  Social History   Socioeconomic History  . Marital status: Legally Separated    Spouse name: Not  on file  . Number of children: Not on file  . Years of education: Not on file  . Highest education level: Not on file  Occupational History  . Not on file  Tobacco Use  . Smoking status: Current Every Day Smoker    Packs/day: 1.00    Years: 20.00    Pack years: 20.00    Types: Cigarettes  . Smokeless tobacco: Never Used  Substance and Sexual Activity  . Alcohol use: No    Alcohol/week: 0.0 standard drinks    Comment: socially  . Drug use: No  . Sexual activity: Yes    Birth control/protection: Surgical  Other Topics Concern  . Not on file  Social History Narrative  . Not on file   Social  Determinants of Health   Financial Resource Strain:   . Difficulty of Paying Living Expenses: Not on file  Food Insecurity:   . Worried About Programme researcher, broadcasting/film/video in the Last Year: Not on file  . Ran Out of Food in the Last Year: Not on file  Transportation Needs:   . Lack of Transportation (Medical): Not on file  . Lack of Transportation (Non-Medical): Not on file  Physical Activity:   . Days of Exercise per Week: Not on file  . Minutes of Exercise per Session: Not on file  Stress:   . Feeling of Stress : Not on file  Social Connections:   . Frequency of Communication with Friends and Family: Not on file  . Frequency of Social Gatherings with Friends and Family: Not on file  . Attends Religious Services: Not on file  . Active Member of Clubs or Organizations: Not on file  . Attends Banker Meetings: Not on file  . Marital Status: Not on file    Allergies:  Allergies  Allergen Reactions  . Morphine And Related Itching and Rash    Metabolic Disorder Labs: Lab Results  Component Value Date   HGBA1C  07/12/2007    5.5 (NOTE)   The ADA recommends the following therapeutic goals for glycemic   control related to Hgb A1C measurement:   Goal of Therapy:   < 7.0% Hgb A1C   Action Suggested:  > 8.0% Hgb A1C   Ref:  Diabetes Care, 22, Suppl. 1, 1999   MPG 119 07/12/2007   No results found for: PROLACTIN Lab Results  Component Value Date   CHOL 197 01/17/2013   TRIG 171 (H) 01/17/2013   HDL 32 (L) 01/17/2013   CHOLHDL 6.2 01/17/2013   VLDL 34 01/17/2013   LDLCALC 131 (H) 01/17/2013   LDLCALC 102 (H) 08/31/2011   Lab Results  Component Value Date   TSH 0.216 (L) 04/05/2018   TSH 0.193 (L) 03/01/2018    Therapeutic Level Labs: No results found for: LITHIUM No results found for: VALPROATE No components found for:  CBMZ  Current Medications: Current Outpatient Medications  Medication Sig Dispense Refill  . buPROPion (WELLBUTRIN XL) 150 MG 24 hr tablet 450  mg daily (300 mg + 150 mg ) 30 tablet 2  . buPROPion (WELLBUTRIN XL) 300 MG 24 hr tablet 450 mg daily (300 mg + 150 mg ) 30 tablet 2  . doxycycline (VIBRAMYCIN) 100 MG capsule Take 1 capsule (100 mg total) by mouth 2 (two) times daily. One po bid x 7 days 14 capsule 0  . levothyroxine (SYNTHROID, LEVOTHROID) 200 MCG tablet Take 1 tablet (200 mcg total) by mouth daily before breakfast. 30 tablet 0  .  naproxen (NAPROSYN) 500 MG tablet Take 1 every 12 hours for fevers and aches 30 tablet 0  . ondansetron (ZOFRAN ODT) 4 MG disintegrating tablet 4mg  ODT q4 hours prn nausea/vomit 12 tablet 0  . pantoprazole (PROTONIX) 40 MG tablet Take 40 mg by mouth 2 (two) times daily before a meal.    . Pseudoeph-Doxylamine-DM-APAP (NYQUIL D COLD/FLU PO) Take 1 tablet by mouth daily.    venlafaxine XR (EFFEXOR-XR) 150 MG 24 hr capsule Take total of 225 mg daily (150 mg+ 75 mg) 30 capsule 3  . venlafaxine XR (EFFEXOR-XR) 75 MG 24 hr capsule Take total of 225 mg daily (150 mg+ 75 mg) 30 capsule 3   No current facility-administered medications for this visit.     Psychiatric Specialty Exam: Review of Systems  There were no vitals taken for this visit.There is no height or weight on file to calculate BMI.  General Appearance: unable to assess due to phone visit  Eye Contact:  unable to assess due to phone visit  Speech:  Clear and Coherent and Normal Rate  Volume:  Normal  Mood:  Depressed and Tearful and anxious  Affect:  Tearful and Sad and anxious  Thought Process:  Goal Directed, Linear and Descriptions of Associations: Intact  Orientation:  Full (Time, Place, and Person)  Thought Content: Logical   Suicidal Thoughts:  No  Homicidal Thoughts:  No  Memory:  Recent;   Good Remote;   Good  Judgement:  Fair  Insight:  Fair  Psychomotor Activity:  Normal  Concentration:  Concentration: Good and Attention Span: Good  Recall:  Good  Fund of Knowledge: Good  Language: Good  Akathisia:  Negative  Handed:   Right  AIMS (if indicated): not done  Assets:  Communication Skills Desire for Improvement Financial Resources/Insurance Housing  ADL's:  Intact  Cognition: WNL  Sleep:  Fair   Screenings: AIMS     [facility]  AIMS Total Score  0    AUDIT     [facility]  Alcohol Use Disorder Identification Test Final Score (AUDIT)  0       Assessment and Plan: 49 y.o. year old female with a history of depression, hypothyroidism, who was contacted via phone for follow-up. Patient reported that she is under a lot of stress due to taking care of her three grandchildren with home schooling and also due to her mother being diagnosed with vascular dementia recently. She was agreeable to adding low-dose Xanax to help her with anxiety as she has taken it with good effect in the past.  1. Moderate episode of recurrent major depressive disorder (HCC)  - buPROPion (WELLBUTRIN XL) 300 MG 24 hr tablet; 450 mg daily (300 mg + 150 mg )  Dispense: 30 tablet; Refill: 1 - buPROPion (WELLBUTRIN XL) 150 MG 24 hr tablet; 450 mg daily (300 mg + 150 mg )  Dispense: 30 tablet; Refill: 1 - venlafaxine XR (EFFEXOR-XR) 75 MG 24 hr capsule; Take total of 225 mg daily (150 mg+ 75 mg)  Dispense: 30 capsule; Refill: 1 - venlafaxine XR (EFFEXOR-XR) 150 MG 24 hr capsule; Take total of 225 mg daily (150 mg+ 75 mg)  Dispense: 30 capsule; Refill: 1  2. Anxiety - Restart ALPRAZolam (XANAX) 0.5 MG tablet; Take 1 tablet (0.5 mg total) by mouth 2 (two) times daily as needed for anxiety or sleep.  Dispense: 60  tablet; Refill: 1   Resume individual therapy with Mr. Glori Bickers. F/up in 6 weeks.   [place] Crane, MD 07/24/2019, 1:07 PM

## 2019-07-25 ENCOUNTER — Ambulatory Visit (INDEPENDENT_AMBULATORY_CARE_PROVIDER_SITE_OTHER): Payer: BC Managed Care – PPO | Admitting: Clinical

## 2019-07-25 DIAGNOSIS — F330 Major depressive disorder, recurrent, mild: Secondary | ICD-10-CM

## 2019-07-25 NOTE — Progress Notes (Addendum)
Virtual Visit via Telephone Note  I connected with [name] on 07/25/19 at  9:00 AM EST by telephone and verified that I am speaking with the correct person using two identifiers.  Location: Patient: Home Provider: Office   I discussed the limitations, risks, security and privacy concerns of performing an evaluation and management service by telephone and the availability of in person appointments. I also discussed with the patient that there may be a patient responsible charge related to this service. The patient expressed understanding and agreed to proceed.     THERAPIST PROGRESS NOTE  Session Time: 9:00AM-9:20AM  Participation Level: Active  Behavioral Response: CasualAlertDepressed  Type of Therapy: Individual Therapy  Treatment Goals addressed: Diagnosis: MDD  Interventions: CBT  Summary: [name] is a 49 y.o. female who presents with Depression. The OPT therapist worked with the client for her initial session post therapist transition. The OPT therapist utilized Motivational Interviewing to assist in creating therapeutic repore. The patient in the session was engaged and work in collaboration giving feedback about her triggers and symptoms over the past few weeks including finding out her mother has progressive Dementia. The OPT therapist utilized Cognitive Behavioral Therapy through cognitive restructuring as well as worked with the patient on coping strategies to assist in management of Depression and Anxiety. The OPT therapist inquired for holistic care about the patients adherence to medication therapy.  Suicidal/Homicidal: Nowithout intent/plan  Therapist Response: The OPT therapist worked with the patient for the patients initial scheduled session post therapist change. The patient was engaged in her session and gave feedback in relation to triggers, symptoms, and behavior responses over the past 2 weeks. The OPT therapist worked with the patient utilizing  an in session Cognitive Behavioral Therapy exercise in challenging defeating/negative automatic thoughts. The patient was responsive in the session and verbalized, " I am going to be getting help for my Mothers condition from her health care doctors and I am working with my dad being supportive". The patient indicated compliance in relation to her current medication therapy and noted Xanax has been added to help her in managing Anxiety.  The patient was not able to continue for her full scheduled session due to noting she felt she had a stomach bug and was up all night before the session sick on her stomach vomiting and not able to get sleep. The OPT therapist advised the patient should her symptoms worsen or continue without improvement she should if able or have a family member take her for further evaluation at local hospital. The OPT therapist will continue treatment work with the patient in her next scheduled session.  Plan: Return again in 2 weeks.  Diagnosis: Axis I: Major depressive disorder, recurrent episode, mild with anxious distress     Axis II: No diagnosis  I discussed the assessment and treatment plan with the patient. The patient was provided an opportunity to ask questions and all were answered. The patient agreed with the plan and demonstrated an understanding of the instructions.   The patient was advised to call back or seek an in-person evaluation if the symptoms worsen or if the condition fails to improve as anticipated.  I provided 20 minutes of non-face-to-face time during this encounter.  Winfred Burn, LCSW 07/25/2019

## 2019-08-14 ENCOUNTER — Ambulatory Visit (HOSPITAL_COMMUNITY): Payer: BC Managed Care – PPO | Admitting: Clinical

## 2019-08-14 ENCOUNTER — Other Ambulatory Visit: Payer: Self-pay

## 2019-08-14 DIAGNOSIS — F330 Major depressive disorder, recurrent, mild: Secondary | ICD-10-CM

## 2019-08-14 NOTE — Progress Notes (Signed)
  Virtual Visit via Telephone Note  I connected with [name] on 08/14/19 at  1:00 PM EST by telephone and verified that I am speaking with the correct person using two identifiers.  Location: Patient: Home Provider: Office   I discussed the limitations, risks, security and privacy concerns of performing an evaluation and management service by telephone and the availability of in person appointments. I also discussed with the patient that there may be a patient responsible charge related to this service. The patient expressed understanding and agreed to proceed.      THERAPIST PROGRESS NOTE  Session Time: 1:00AM-1:40AM  Participation Level: Active  Behavioral Response: CasualAlertDepressed  Type of Therapy: Individual Therapy  Treatment Goals addressed: Coping  Interventions: CBT  Summary: [name] is a 49 y.o. female who presents with Depression and Anxiety. The patient meet with the OPT therapist for her scheduled session. The OPT therapist utilized Motivational Interviewing to assist in creating therapeutic repore. The patient in the session was engaged and work in collaboration giving feedback about her triggers and symptoms over the past few weeks including ongoing health struggles with her Mother (Dementia and Cancer) and caregiving for grandkids who are on virtual learning due to COVID pandemic. The OPT therapist utilized Cognitive Behavioral Therapy through cognitive restructuring as well as worked with the patient on coping strategies to assist in management of Depression and Anxiety. The OPT therapist worked with the patient on consistency with her own self care.  Suicidal/Homicidal: Nowithout intent/plan  Therapist Response: The OPT therapist worked with the patient for the patients scheduled session post therapist change. The patient was engaged in her session and gave feedback in relation to triggers, symptoms, and behavior responses over the past few  weeks. The OPT therapist worked with the patient utilizing an in session Cognitive Behavioral Therapy exercise in challenging defeating/negative automatic thoughts. The patient was responsive in the session and verbalized, " I am going to focus on myself and give myself more breaks throughout the week". The OPT therapist will continue treatment work with the patient in her next scheduled session,   Plan: Return again in 2 weeks.  Diagnosis: Axis I: Major depressive disorder, recurrent episode, mild with anxious distress    Axis II: No diagnosis  I discussed the assessment and treatment plan with the patient. The patient was provided an opportunity to ask questions and all were answered. The patient agreed with the plan and demonstrated an understanding of the instructions.   The patient was advised to call back or seek an in-person evaluation if the symptoms worsen or if the condition fails to improve as anticipated.  I provided 40 minutes of non-face-to-face time during this encounter.  Winfred Burn, LCSW 08/14/2019
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
## 2019-09-04 ENCOUNTER — Other Ambulatory Visit: Payer: Self-pay

## 2019-09-04 ENCOUNTER — Ambulatory Visit (INDEPENDENT_AMBULATORY_CARE_PROVIDER_SITE_OTHER): Payer: BC Managed Care – PPO | Admitting: Psychiatry

## 2019-09-04 ENCOUNTER — Encounter (HOSPITAL_COMMUNITY): Payer: Self-pay | Admitting: Psychiatry

## 2019-09-04 DIAGNOSIS — F331 Major depressive disorder, recurrent, moderate: Secondary | ICD-10-CM

## 2019-09-04 MED ORDER — ESCITALOPRAM OXALATE 20 MG PO TABS: ORAL_TABLET | ORAL | 1 refills | Status: DC

## 2019-09-04 MED ORDER — CLONAZEPAM 0.5 MG PO TBDP: 0.5000 mg | ORAL_TABLET | Freq: Two times a day (BID) | ORAL | 1 refills | Status: DC | PRN

## 2019-09-04 NOTE — Patient Instructions (Signed)
1.Week 1: Start lexapro 10 mg daily, decrease venlafaxine 150 mg daily     Week 2: Increase lexapro 20 mg daily, discontinue venlafaxine  2. Continuebupropion450 mg daily(300 mg + 150 mg) 3. Start clonazepam 0.5 mg twice a day as needed for anxiety  4. Discontinue Xanax  5. Next appointment: 4/26 at 11:40

## 2019-10-04 DIAGNOSIS — Z0000 Encounter for general adult medical examination without abnormal findings: Secondary | ICD-10-CM | POA: Diagnosis not present

## 2019-10-04 DIAGNOSIS — Z6826 Body mass index (BMI) 26.0-26.9, adult: Secondary | ICD-10-CM | POA: Diagnosis not present

## 2019-10-09 ENCOUNTER — Other Ambulatory Visit (HOSPITAL_COMMUNITY): Payer: Self-pay | Admitting: Psychiatry

## 2019-10-09 ENCOUNTER — Telehealth (HOSPITAL_COMMUNITY): Payer: Self-pay

## 2019-10-09 DIAGNOSIS — F331 Major depressive disorder, recurrent, moderate: Secondary | ICD-10-CM

## 2019-10-09 MED ORDER — BUPROPION HCL ER (XL) 300 MG PO TB24: ORAL_TABLET | ORAL | 0 refills | Status: DC

## 2019-10-09 NOTE — Telephone Encounter (Signed)
Spoke with pt on the phone. Requesting refill of Wellbutrin XL 300mg  tabs. Pt reports she was able to refill 150mg  tabs but not 300mg . States she does not have enough to last until upcoming appointment 10/16/19. If appropriate, please send to [facility].

## 2019-10-09 NOTE — Telephone Encounter (Signed)
Called pt and made aware of rx sent to pharmacy.

## 2019-10-09 NOTE — Telephone Encounter (Signed)
Ordered
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
## 2019-10-16 ENCOUNTER — Telehealth (INDEPENDENT_AMBULATORY_CARE_PROVIDER_SITE_OTHER): Payer: BC Managed Care – PPO | Admitting: Psychiatry

## 2019-10-16 ENCOUNTER — Telehealth (HOSPITAL_COMMUNITY): Payer: Self-pay | Admitting: *Deleted

## 2019-10-16 ENCOUNTER — Other Ambulatory Visit: Payer: Self-pay

## 2019-10-16 ENCOUNTER — Encounter (HOSPITAL_COMMUNITY): Payer: Self-pay | Admitting: Psychiatry

## 2019-10-16 ENCOUNTER — Other Ambulatory Visit (HOSPITAL_COMMUNITY): Payer: Self-pay | Admitting: Psychiatry

## 2019-10-16 DIAGNOSIS — F330 Major depressive disorder, recurrent, mild: Secondary | ICD-10-CM

## 2019-10-16 MED ORDER — BUPROPION HCL ER (XL) 300 MG PO TB24: ORAL_TABLET | ORAL | 0 refills | Status: DC

## 2019-10-16 MED ORDER — VENLAFAXINE HCL ER 75 MG PO CP24: ORAL_CAPSULE | ORAL | 1 refills | Status: DC

## 2019-10-16 MED ORDER — CLONAZEPAM 0.5 MG PO TABS: 0.5000 mg | ORAL_TABLET | Freq: Two times a day (BID) | ORAL | 1 refills | Status: DC | PRN

## 2019-10-16 MED ORDER — VENLAFAXINE HCL ER 150 MG PO CP24: ORAL_CAPSULE | ORAL | 1 refills | Status: DC

## 2019-10-16 MED ORDER — BUPROPION HCL ER (XL) 150 MG PO TB24: ORAL_TABLET | ORAL | 1 refills | Status: DC

## 2019-10-16 MED ORDER — CLONAZEPAM 0.5 MG PO TBDP: 0.5000 mg | ORAL_TABLET | Freq: Two times a day (BID) | ORAL | 1 refills | Status: DC | PRN

## 2019-10-16 NOTE — Telephone Encounter (Signed)
Ordered regular clonazepam tablet to [facility]. Please cancel the order sent to [facility] pharmacy.

## 2019-10-16 NOTE — Patient Instructions (Signed)
1. Continue lexapro 20 mg daily  2. Continuebupropion450 mg daily(300 mg + 150 mg) 3. Continue clonazepam 0.5 mg twice a day as needed for anxiety   4. Next appointment:       6/8 at 1:20

## 2019-10-16 NOTE — Telephone Encounter (Signed)
[facility] called stated the scripts for psych med's that has been sent due to insurance needs to go to  ( they have d/c') [facility]  updated in EPIC.  Rx stated to check script : clonazePAM (KLONOPIN)  0.5 MG disintegrating tablet --- patient previously doesn't take the disintegrating tablet
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
## 2019-11-28 ENCOUNTER — Encounter (HOSPITAL_COMMUNITY): Payer: Self-pay | Admitting: Psychiatry

## 2019-11-28 ENCOUNTER — Other Ambulatory Visit (HOSPITAL_COMMUNITY): Payer: Self-pay | Admitting: Psychiatry

## 2019-11-28 ENCOUNTER — Telehealth (INDEPENDENT_AMBULATORY_CARE_PROVIDER_SITE_OTHER): Payer: BC Managed Care – PPO | Admitting: Psychiatry

## 2019-11-28 ENCOUNTER — Other Ambulatory Visit: Payer: Self-pay

## 2019-11-28 DIAGNOSIS — F419 Anxiety disorder, unspecified: Secondary | ICD-10-CM | POA: Diagnosis not present

## 2019-11-28 DIAGNOSIS — F330 Major depressive disorder, recurrent, mild: Secondary | ICD-10-CM | POA: Diagnosis not present

## 2019-11-28 MED ORDER — BUPROPION HCL ER (XL) 300 MG PO TB24: ORAL_TABLET | ORAL | 1 refills | Status: DC

## 2019-11-28 MED ORDER — ABILIFY MYCITE 2 MG PO TABS: 2.0000 mg | ORAL_TABLET | Freq: Every day | ORAL | 1 refills | Status: DC

## 2019-11-28 MED ORDER — ESCITALOPRAM OXALATE 20 MG PO TABS: 20.0000 mg | ORAL_TABLET | Freq: Every day | ORAL | 1 refills | Status: DC

## 2019-11-28 MED ORDER — BUPROPION HCL ER (XL) 150 MG PO TB24: ORAL_TABLET | ORAL | 1 refills | Status: DC

## 2019-11-28 MED ORDER — CLONAZEPAM 0.5 MG PO TABS: 0.5000 mg | ORAL_TABLET | Freq: Two times a day (BID) | ORAL | 1 refills | Status: DC | PRN

## 2019-11-28 MED ORDER — ARIPIPRAZOLE 2 MG PO TABS: 2.0000 mg | ORAL_TABLET | Freq: Every day | ORAL | 1 refills | Status: AC

## 2019-11-28 NOTE — Patient Instructions (Signed)
1. Continue lexapro 20 mg daily  2.Continuebupropion450 mg daily(300 mg + 150 mg) 3. Start Abilify 2 mg daily  4. Continue clonazepam 0.5 mg twice a day as needed for anxiety  5. Next appointment: 7/27 at 1:20

## 2020-01-04 DIAGNOSIS — I10 Essential (primary) hypertension: Secondary | ICD-10-CM | POA: Diagnosis not present

## 2020-01-04 DIAGNOSIS — E782 Mixed hyperlipidemia: Secondary | ICD-10-CM | POA: Diagnosis not present

## 2020-01-04 DIAGNOSIS — G43909 Migraine, unspecified, not intractable, without status migrainosus: Secondary | ICD-10-CM | POA: Diagnosis not present

## 2020-01-04 DIAGNOSIS — Z0000 Encounter for general adult medical examination without abnormal findings: Secondary | ICD-10-CM | POA: Diagnosis not present

## 2020-01-04 DIAGNOSIS — G4700 Insomnia, unspecified: Secondary | ICD-10-CM | POA: Diagnosis not present

## 2020-01-04 DIAGNOSIS — E038 Other specified hypothyroidism: Secondary | ICD-10-CM | POA: Diagnosis not present

## 2020-01-04 DIAGNOSIS — K5900 Constipation, unspecified: Secondary | ICD-10-CM | POA: Diagnosis not present
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
## 2020-01-16 ENCOUNTER — Telehealth (INDEPENDENT_AMBULATORY_CARE_PROVIDER_SITE_OTHER): Payer: BC Managed Care – PPO | Admitting: Psychiatry

## 2020-01-16 ENCOUNTER — Other Ambulatory Visit: Payer: Self-pay

## 2020-01-16 ENCOUNTER — Encounter (HOSPITAL_COMMUNITY): Payer: Self-pay | Admitting: Psychiatry

## 2020-01-16 DIAGNOSIS — F330 Major depressive disorder, recurrent, mild: Secondary | ICD-10-CM

## 2020-01-16 DIAGNOSIS — F419 Anxiety disorder, unspecified: Secondary | ICD-10-CM

## 2020-01-16 MED ORDER — ARIPIPRAZOLE 5 MG PO TABS: 5.0000 mg | ORAL_TABLET | Freq: Every day | ORAL | 1 refills | Status: AC

## 2020-01-16 MED ORDER — BUPROPION HCL ER (XL) 300 MG PO TB24: ORAL_TABLET | ORAL | 1 refills | Status: AC

## 2020-01-16 MED ORDER — CLONAZEPAM 0.5 MG PO TABS: 0.5000 mg | ORAL_TABLET | Freq: Two times a day (BID) | ORAL | 0 refills | Status: AC | PRN

## 2020-01-16 MED ORDER — BUPROPION HCL ER (XL) 150 MG PO TB24: ORAL_TABLET | ORAL | 1 refills | Status: AC

## 2020-01-16 MED ORDER — ESCITALOPRAM OXALATE 20 MG PO TABS: 20.0000 mg | ORAL_TABLET | Freq: Every day | ORAL | 1 refills | Status: AC

## 2020-01-29 ENCOUNTER — Telehealth (HOSPITAL_COMMUNITY): Payer: Self-pay | Admitting: Psychiatry

## 2020-01-29 NOTE — Telephone Encounter (Signed)
Received a notification from pharmacy that she filled venlafaxine in July. This medication was discontinued in March. Please contact the patient to make sure she is NOT taking venlafaxine anymore.

## 2020-01-29 NOTE — Telephone Encounter (Signed)
Spoke with patient and she stated that she is not taking this medication
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
## 2020-03-05 ENCOUNTER — Telehealth (HOSPITAL_COMMUNITY): Payer: Self-pay | Admitting: Psychiatry

## 2020-03-07 ENCOUNTER — Telehealth (HOSPITAL_COMMUNITY): Payer: Self-pay | Admitting: Psychiatry

## 2020-03-07 ENCOUNTER — Other Ambulatory Visit: Payer: Self-pay

## 2020-03-07 NOTE — Telephone Encounter (Signed)
Called the patient twice for appointment scheduled today. The patient did not answer the phone. Voice message was full.  °

## 2020-08-07 DIAGNOSIS — E038 Other specified hypothyroidism: Secondary | ICD-10-CM | POA: Diagnosis not present

## 2020-08-07 DIAGNOSIS — I10 Essential (primary) hypertension: Secondary | ICD-10-CM | POA: Diagnosis not present

## 2020-08-07 DIAGNOSIS — G43909 Migraine, unspecified, not intractable, without status migrainosus: Secondary | ICD-10-CM | POA: Diagnosis not present

## 2020-08-07 DIAGNOSIS — G4700 Insomnia, unspecified: Secondary | ICD-10-CM | POA: Diagnosis not present

## 2021-01-18 ENCOUNTER — Other Ambulatory Visit: Payer: Self-pay

## 2021-01-18 ENCOUNTER — Ambulatory Visit
Admission: RE | Admit: 2021-01-18 | Discharge: 2021-01-18 | Disposition: A | Discharge status: Home / Self Care | Payer: BC Managed Care – PPO | Source: Ambulatory Visit | Attending: Emergency Medicine | Admitting: Emergency Medicine

## 2021-01-18 VITALS — BP 120/75 | HR 92 | Temp 99.5°F | Resp 20

## 2021-01-18 DIAGNOSIS — J069 Acute upper respiratory infection, unspecified: Secondary | ICD-10-CM | POA: Insufficient documentation

## 2021-01-18 DIAGNOSIS — H66003 Acute suppurative otitis media without spontaneous rupture of ear drum, bilateral: Secondary | ICD-10-CM | POA: Insufficient documentation

## 2021-01-18 DIAGNOSIS — Z1152 Encounter for screening for COVID-19: Secondary | ICD-10-CM | POA: Diagnosis not present

## 2021-01-18 LAB — POCT RAPID STREP A (OFFICE): Rapid Strep A Screen: NEGATIVE

## 2021-01-18 MED ORDER — BENZONATATE 100 MG PO CAPS: 100.0000 mg | ORAL_CAPSULE | Freq: Three times a day (TID) | ORAL | 0 refills | Status: AC

## 2021-01-18 MED ORDER — AMOXICILLIN-POT CLAVULANATE 875-125 MG PO TABS: 1.0000 | ORAL_TABLET | Freq: Two times a day (BID) | ORAL | 0 refills | Status: AC

## 2021-01-18 NOTE — ED Provider Notes (Signed)
[facility]   [id number] 01/18/21 Arrival Time: 1240   CC: COVID symptoms  SUBJECTIVE: History from: patient.  Terrilyn Allbritton is a 50 y.o. female who presents with nasal congestion, HA, sore throat, and cough x 3 days.  Denies sick exposure to COVID, flu or strep.  Has tried OTC medications without relief.  Denies aggravating factors.   Reports previous covid infection in the past.   Denies fever, chills, fatigue, sinus pain, rhinorrhea, sore throat, SOB, wheezing, chest pain, nausea, changes in bowel or bladder habits.    Home covid test negative   ROS: As per HPI.  All other pertinent ROS negative.     Past Medical History:  Diagnosis Date   Anxiety    Depression    Diverticula of intestine    IBS (irritable bowel syndrome)    Migraines    Thyroid disease    Ulcerative colitis    Vertigo    Past Surgical History:  Procedure Laterality Date   ABDOMINAL HYSTERECTOMY     APPENDECTOMY     CHOLECYSTECTOMY     ESOPHAGOGASTRODUODENOSCOPY N/A 10/23/2015   Procedure: ESOPHAGOGASTRODUODENOSCOPY (EGD);  Surgeon: Malissa Hippo, MD;  [facility]: [facility];  Service: Endoscopy;  Laterality: N/A;   FLEXIBLE SIGMOIDOSCOPY N/A 11/07/2015   Procedure: FLEXIBLE SIGMOIDOSCOPY;  Surgeon: Malissa Hippo, MD;  Location: [facility];  Service: Endoscopy;  Laterality: N/A;   KNEE ARTHROSCOPY     Allergies  Allergen Reactions   Morphine And Related Itching and Rash   No current facility-administered medications on file prior to encounter.   Current Outpatient Medications on File Prior to Encounter  Medication Sig Dispense Refill   ARIPiprazole (ABILIFY) 2 MG tablet Take 1 tablet (2 mg total) by mouth daily. 30 tablet 1   ARIPiprazole (ABILIFY) 5 MG tablet Take 1 tablet (5 mg total) by mouth daily. 30 tablet 1   buPROPion (WELLBUTRIN XL) 150 MG 24 hr tablet 450 mg daily (300 mg + 150 mg ) 30 tablet 1   buPROPion (WELLBUTRIN XL) 300 MG 24 hr tablet 450 mg daily (300 mg + 150  mg ) 30 tablet 1   clonazePAM (KLONOPIN) 0.5 MG tablet Take 1 tablet (0.5 mg total) by mouth 2 (two) times daily as needed for anxiety. 60 tablet 0   escitalopram (LEXAPRO) 20 MG tablet Take 1 tablet (20 mg total) by mouth daily. 30 tablet 1   levothyroxine (SYNTHROID, LEVOTHROID) 200 MCG tablet Take 1 tablet (200 mcg total) by mouth daily before breakfast. 30 tablet 0   naproxen (NAPROSYN) 500 MG tablet Take 1 every 12 hours for fevers and aches 30 tablet 0   ondansetron (ZOFRAN ODT) 4 MG disintegrating tablet 4mg  ODT q4 hours prn nausea/vomit 12 tablet 0   pantoprazole (PROTONIX) 40 MG tablet Take 40 mg by mouth 2 (two) times daily before a meal.     Pseudoeph-Doxylamine-DM-APAP (NYQUIL D COLD/FLU PO) Take 1 tablet by mouth daily.     Social History   Socioeconomic History   Marital status: Legally Separated    Spouse name: Not on file   Number of children: Not on file   Years of education: Not on file   Highest education level: Not on file  Occupational History   Not on file  Tobacco Use   Smoking status: Every Day    Packs/day: 1.00    Years: 20.00    Pack years: 20.00    Types: Cigarettes   Smokeless tobacco: Never  Vaping  Use   Vaping Use: Never used  Substance and Sexual Activity   Alcohol use: No    Alcohol/week: 0.0 standard drinks    Comment: socially   Drug use: No   Sexual activity: Yes    Birth control/protection: Surgical  Other Topics Concern   Not on file  Social History Narrative   Not on file   Social Determinants of Health   Financial Resource Strain: Not on file  Food Insecurity: Not on file  Transportation Needs: Not on file  Physical Activity: Not on file  Stress: Not on file  Social Connections: Not on file  Intimate Partner Violence: Not on file   Family History  Problem Relation Age of Onset   Diabetes Mother    COPD Mother    Heart disease Mother    Anxiety disorder Mother    Depression Mother    Breast cancer Paternal Grandmother     Lung cancer Paternal Grandfather     OBJECTIVE:  Vitals:   01/18/21 1258  BP: 120/75  Pulse: 92  Resp: 20  Temp: 99.5 F (37.5 C)  TempSrc: Oral  SpO2: 98%     General appearance: alert; appears fatigued, but nontoxic; speaking in full sentences and tolerating own secretions HEENT: NCAT; Ears: EACs clear, TMs erythematous Eyes: PERRL.  EOM grossly intact. Nose: nares patent without rhinorrhea, Throat: oropharynx clear, tonsils non erythematous or enlarged, uvula midline  Neck: supple without LAD Lungs: unlabored respirations, symmetrical air entry; cough: mild; no respiratory distress; CTAB Heart: regular rate and rhythm.   Skin: warm and dry Psychological: alert and cooperative; normal mood and affect  LABS:  Results for orders placed or performed during the hospital encounter of 01/18/21 (from the past 24 hour(s))  POCT rapid strep A     Status: None   Collection Time: 01/18/21  1:06 PM  Result Value Ref Range   Rapid Strep A Screen Negative Negative     ASSESSMENT & PLAN:  1. Encounter for screening for COVID-19   2. Viral URI with cough   3. Non-recurrent acute suppurative otitis media of both ears without spontaneous rupture of tympanic membranes     Meds ordered this encounter  Medications   benzonatate (TESSALON) 100 MG capsule    Sig: Take 1 capsule (100 mg total) by mouth every 8 (eight) hours.    Dispense:  21 capsule    Refill:  0    Order Specific Question:   Supervising Provider    Answer:   Eustace Moore [[id number]]   amoxicillin-clavulanate (AUGMENTIN) 875-125 MG tablet    Sig: Take 1 tablet by mouth every 12 (twelve) hours for 10 days.    Dispense:  20 tablet    Refill:  0    Order Specific Question:   Supervising Provider    Answer:   Eustace Moore [[id number]]    Strep negative. Culture sent  COVID testing ordered.  It will take between 5-7 days for test results.  Someone will contact you regarding abnormal results.    In the  meantime: You should remain isolated in your home for 10 days from symptom onset AND greater than 72 hours after symptoms resolution (absence of fever without the use of fever-reducing medication and improvement in respiratory symptoms), whichever is longer Get plenty of rest and push fluids Tessalon Perles prescribed for cough Use OTC zyrtec for nasal congestion, runny nose, and/or sore throat Use OTC flonase for nasal congestion and runny nose Use medications daily  for symptom relief Use OTC medications like ibuprofen or tylenol as needed fever or pain Call or go to the ED if you have any new or worsening symptoms such as fever, worsening cough, shortness of breath, chest tightness, chest pain, turning blue, changes in mental status, etc...   Augmentin for bilateral ear infections  Reviewed expectations re: course of current medical issues. Questions answered. Outlined signs and symptoms indicating need for more acute intervention. Patient verbalized understanding. After Visit Summary given.          Rennis Harding, PA-C 01/18/21 1335

## 2021-01-18 NOTE — Discharge Instructions (Addendum)
Strep negative.  Culture sent.   COVID testing ordered.  It will take between 5-7 days for test results.  Someone will contact you regarding abnormal results.    In the meantime: You should remain isolated in your home for 10 days from symptom onset AND greater than 72 hours after symptoms resolution (absence of fever without the use of fever-reducing medication and improvement in respiratory symptoms), whichever is longer Get plenty of rest and push fluids Tessalon Perles prescribed for cough Use OTC zyrtec for nasal congestion, runny nose, and/or sore throat Use OTC flonase for nasal congestion and runny nose Use medications daily for symptom relief Use OTC medications like ibuprofen or tylenol as needed fever or pain Call or go to the ED if you have any new or worsening symptoms such as fever, worsening cough, shortness of breath, chest tightness, chest pain, turning blue, changes in mental status, etc...  

## 2021-01-18 NOTE — ED Triage Notes (Signed)
Pt presents today with c/o of nasal congestion, sore throat and cough x 3 days. Home Covid test taken on Thursday; negative. +low grade fever

## 2021-01-19 LAB — COVID-19, FLU A+B NAA
Influenza A, NAA: NOT DETECTED
Influenza B, NAA: NOT DETECTED
SARS-CoV-2, NAA: NOT DETECTED

## 2021-01-20 LAB — CULTURE, GROUP A STREP (THRC)

## 2021-04-10 ENCOUNTER — Ambulatory Visit: Payer: Self-pay

## 2021-05-28 DIAGNOSIS — Z0000 Encounter for general adult medical examination without abnormal findings: Secondary | ICD-10-CM | POA: Diagnosis not present

## 2021-05-28 DIAGNOSIS — Z6824 Body mass index (BMI) 24.0-24.9, adult: Secondary | ICD-10-CM | POA: Diagnosis not present

## 2021-06-02 DIAGNOSIS — Z9049 Acquired absence of other specified parts of digestive tract: Secondary | ICD-10-CM | POA: Diagnosis not present

## 2021-06-02 DIAGNOSIS — R1084 Generalized abdominal pain: Secondary | ICD-10-CM | POA: Diagnosis not present

## 2021-06-02 DIAGNOSIS — R109 Unspecified abdominal pain: Secondary | ICD-10-CM | POA: Diagnosis not present

## 2021-09-08 DIAGNOSIS — R1084 Generalized abdominal pain: Secondary | ICD-10-CM | POA: Diagnosis not present

## 2021-09-08 DIAGNOSIS — M5459 Other low back pain: Secondary | ICD-10-CM | POA: Diagnosis not present

## 2021-09-08 DIAGNOSIS — F330 Major depressive disorder, recurrent, mild: Secondary | ICD-10-CM | POA: Diagnosis not present

## 2021-09-08 DIAGNOSIS — Z0000 Encounter for general adult medical examination without abnormal findings: Secondary | ICD-10-CM | POA: Diagnosis not present

## 2021-09-08 DIAGNOSIS — I10 Essential (primary) hypertension: Secondary | ICD-10-CM | POA: Diagnosis not present

## 2021-09-08 DIAGNOSIS — G43909 Migraine, unspecified, not intractable, without status migrainosus: Secondary | ICD-10-CM | POA: Diagnosis not present

## 2021-09-08 DIAGNOSIS — K5909 Other constipation: Secondary | ICD-10-CM | POA: Diagnosis not present

## 2021-09-08 DIAGNOSIS — E038 Other specified hypothyroidism: Secondary | ICD-10-CM | POA: Diagnosis not present

## 2021-09-08 DIAGNOSIS — G4700 Insomnia, unspecified: Secondary | ICD-10-CM | POA: Diagnosis not present

## 2021-10-15 DIAGNOSIS — G8929 Other chronic pain: Secondary | ICD-10-CM | POA: Diagnosis not present

## 2021-10-15 DIAGNOSIS — M25562 Pain in left knee: Secondary | ICD-10-CM | POA: Diagnosis not present

## 2021-10-15 DIAGNOSIS — Z6824 Body mass index (BMI) 24.0-24.9, adult: Secondary | ICD-10-CM | POA: Diagnosis not present

## 2021-10-15 DIAGNOSIS — M2392 Unspecified internal derangement of left knee: Secondary | ICD-10-CM | POA: Diagnosis not present

## 2021-10-28 DIAGNOSIS — M2392 Unspecified internal derangement of left knee: Secondary | ICD-10-CM | POA: Diagnosis not present

## 2021-10-28 DIAGNOSIS — S8992XA Unspecified injury of left lower leg, initial encounter: Secondary | ICD-10-CM | POA: Diagnosis not present

## 2021-10-28 DIAGNOSIS — M7122 Synovial cyst of popliteal space [Baker], left knee: Secondary | ICD-10-CM | POA: Diagnosis not present

## 2021-10-28 DIAGNOSIS — M25462 Effusion, left knee: Secondary | ICD-10-CM | POA: Diagnosis not present

## 2021-10-28 DIAGNOSIS — M1712 Unilateral primary osteoarthritis, left knee: Secondary | ICD-10-CM | POA: Diagnosis not present

## 2021-11-03 DIAGNOSIS — M2392 Unspecified internal derangement of left knee: Secondary | ICD-10-CM | POA: Diagnosis not present

## 2021-11-03 DIAGNOSIS — Z6824 Body mass index (BMI) 24.0-24.9, adult: Secondary | ICD-10-CM | POA: Diagnosis not present

## 2021-12-08 DIAGNOSIS — I10 Essential (primary) hypertension: Secondary | ICD-10-CM | POA: Diagnosis not present

## 2022-01-20 ENCOUNTER — Ambulatory Visit
Admission: RE | Admit: 2022-01-20 | Discharge: 2022-01-20 | Disposition: A | Discharge status: Home / Self Care | Payer: BC Managed Care – PPO | Source: Ambulatory Visit

## 2022-01-20 ENCOUNTER — Ambulatory Visit (INDEPENDENT_AMBULATORY_CARE_PROVIDER_SITE_OTHER): Payer: BC Managed Care – PPO

## 2022-01-20 VITALS — BP 157/75 | HR 103 | Temp 97.8°F | Resp 18

## 2022-01-20 DIAGNOSIS — W19XXXA Unspecified fall, initial encounter: Secondary | ICD-10-CM | POA: Diagnosis not present

## 2022-01-20 DIAGNOSIS — Z043 Encounter for examination and observation following other accident: Secondary | ICD-10-CM | POA: Diagnosis not present

## 2022-01-20 DIAGNOSIS — M25511 Pain in right shoulder: Secondary | ICD-10-CM

## 2022-01-20 MED ORDER — IBUPROFEN 800 MG PO TABS: 800.0000 mg | ORAL_TABLET | Freq: Three times a day (TID) | ORAL | 0 refills | Status: AC | PRN

## 2022-01-20 MED ORDER — KETOROLAC TROMETHAMINE 30 MG/ML IJ SOLN
30.0000 mg | Freq: Once | INTRAMUSCULAR | Status: AC
  Administered 2022-01-20: 30 mg via INTRAMUSCULAR

## 2022-01-20 NOTE — ED Triage Notes (Signed)
Pt reports right shoulder pain after she fel on the floor yesterday. Pt reports she felt a "crunch" in the right shoulder. Pain is worse with movement. Ibuprofen and Tylenol gives no relief.

## 2022-01-20 NOTE — Discharge Instructions (Signed)
-   The shoulder x-ray today does not show any dislocation or fracture - The pain you are having is likely secondary to inflammation; we have given you a shot of Toradol to help treat the pain.  Do not take any ibuprofen, naproxen, or Motrin today.  You can take Tylenol [phone] mg and use heat/ice to help with pain - If pain is not improving over next 1-2 weeks, follow up with Orthopedic provider; contact information is provided

## 2022-01-20 NOTE — ED Provider Notes (Signed)
[facility]    CSN: [id number] Arrival date & time: 01/20/22  1020      History   Chief Complaint Chief Complaint  Patient presents with   Shoulder Injury    fell hard on right side my shoulder took brunt of fall, extreme pain and cannot move it - Entered by patient   Fall         HPI [name] is a 51 y.o. female.   Patient presents with 1 day of right shoulder pain after she fell on her right shoulder yesterday.  Reports when she fell, she heard a crunching sound.  She reports there is pain around the entire shoulder, radiates up to her neck, to her scapula.  Denies any shooting pain down her arm or numbness and tingling in her fingertips.  Reports the pain is constant, worse with any movement.  Describes the pain as sharp.  She has taken Tylenol, ibuprofen without relief.  She denies any weakness, numbness, decreased grip strength, redness, swelling, bruising, and fevers, nausea/vomiting.    Past Medical History:  Diagnosis Date   Anxiety    Depression    Diverticula of intestine    IBS (irritable bowel syndrome)    Migraines    Thyroid disease    Ulcerative colitis    Vertigo     Patient Active Problem List   Diagnosis Date Noted   Moderate episode of recurrent major depressive disorder (HCC) 05/06/2018   Severe recurrent major depression without psychotic features (HCC) 04/06/2018   Colitis 11/06/2015   Vomiting and diarrhea 11/06/2015   GERD (gastroesophageal reflux disease) 11/06/2015   Thrombocytopenia (HCC) 10/21/2015   Hypokalemia 10/19/2015   Hypothyroidism, adult 10/19/2015   Hypovolemia 10/19/2015   Enterocolitis 10/18/2015   Headache in front of head 01/17/2013   Hyperlipidemia 01/17/2013   Chest pain 01/16/2013   Tobacco abuse 01/16/2013   Cellulitis of buttock, left 03/14/2012   Fatty liver 09/01/2011   Anxiety 09/01/2011   Abdominal pain 09/01/2011   Pancreatitis 08/30/2011   Leukocytosis 08/30/2011    Past Surgical  History:  Procedure Laterality Date   ABDOMINAL HYSTERECTOMY     APPENDECTOMY     CHOLECYSTECTOMY     ESOPHAGOGASTRODUODENOSCOPY N/A 10/23/2015   Procedure: ESOPHAGOGASTRODUODENOSCOPY (EGD);  Surgeon: Malissa Hippo, MD;  Location: [facility];  Service: Endoscopy;  Laterality: N/A;   FLEXIBLE SIGMOIDOSCOPY N/A 11/07/2015   Procedure: FLEXIBLE SIGMOIDOSCOPY;  Surgeon: Malissa Hippo, MD;  Location: [facility];  Service: Endoscopy;  Laterality: N/A;   KNEE ARTHROSCOPY      OB History     Gravida      Para      Term      Preterm      AB      Living  3      SAB      IAB      Ectopic      Multiple      Live Births               Home Medications    Prior to Admission medications   Medication Sig Start Date End Date Taking? Authorizing Provider  acetaminophen (TYLENOL) 500 MG tablet Take 500 mg by mouth every 6 (six) hours as needed.   Yes [provider]  ibuprofen (ADVIL) 800 MG tablet Take 1 tablet (800 mg total) by mouth every 8 (eight) hours as needed for moderate pain. Take with food to prevent GI upset 01/20/22  Yes Cathlean Marseilles A, NP  ARIPiprazole (ABILIFY) 2 MG tablet Take 1 tablet (2 mg total) by mouth daily. 11/28/19   Neysa Hotter, MD  ARIPiprazole (ABILIFY) 5 MG tablet Take 1 tablet (5 mg total) by mouth daily. 01/16/20   Neysa Hotter, MD  benzonatate (TESSALON) 100 MG capsule Take 1 capsule (100 mg total) by mouth every 8 (eight) hours. 01/18/21   Wurst, [place], PA-C  buPROPion (WELLBUTRIN XL) 150 MG 24 hr tablet 450 mg daily (300 mg + 150 mg ) 01/26/20   Neysa Hotter, MD  buPROPion (WELLBUTRIN XL) 300 MG 24 hr tablet 450 mg daily (300 mg + 150 mg ) 01/26/20   Hisada, Barbee Cough, MD  clonazePAM (KLONOPIN) 0.5 MG tablet Take 1 tablet (0.5 mg total) by mouth 2 (two) times daily as needed for anxiety. 02/12/20   Neysa Hotter, MD  escitalopram (LEXAPRO) 20 MG tablet Take 1 tablet (20 mg total) by mouth daily. 01/26/20   Neysa Hotter, MD   levothyroxine (SYNTHROID, LEVOTHROID) 200 MCG tablet Take 1 tablet (200 mcg total) by mouth daily before breakfast. 04/09/18   Money, Gerlene Burdock, FNP  LINZESS 72 MCG capsule Take 72 mcg by mouth every morning. 01/03/22   [provider]  losartan (COZAAR) 25 MG tablet Take 25 mg by mouth daily. 12/08/21   [provider]  ondansetron (ZOFRAN ODT) 4 MG disintegrating tablet 4mg  ODT q4 hours prn nausea/vomit 07/31/18   09/29/18, MD  pantoprazole (PROTONIX) 40 MG tablet Take 40 mg by mouth 2 (two) times daily before a meal.    [provider]  Pseudoeph-Doxylamine-DM-APAP (NYQUIL D COLD/FLU PO) Take 1 tablet by mouth daily.    [provider]    Family History Family History  Problem Relation Age of Onset   Diabetes Mother    COPD Mother    Heart disease Mother    Anxiety disorder Mother    Depression Mother    Breast cancer Paternal Grandmother    Lung cancer Paternal Grandfather     Social History Social History   Tobacco Use   Smoking status: Every Day    Packs/day: 1.00    Years: 20.00    Total pack years: 20.00    Types: Cigarettes   Smokeless tobacco: Never  Vaping Use   Vaping Use: Never used  Substance Use Topics   Alcohol use: No    Alcohol/week: 0.0 standard drinks of alcohol    Comment: socially   Drug use: No     Allergies   Morphine and related   Review of Systems Review of Systems Per HPI  Physical Exam Triage Vital Signs ED Triage Vitals  Enc Vitals Group     BP 01/20/22 1033 (!) 157/75     Pulse Rate 01/20/22 1033 (!) 103     Resp 01/20/22 1033 18     Temp 01/20/22 1033 97.8 F (36.6 C)     Temp Source 01/20/22 1033 Oral     SpO2 01/20/22 1033 98 %     Weight --      Height --      Head Circumference --      Peak Flow --      Pain Score 01/20/22 1031 8     Pain Loc --      Pain Edu? --      Excl. in GC? --    No data found.  Updated Vital Signs BP (!) 157/75 (BP Location: Right Arm)   Pulse 03/22/22)  103   Temp 97.8 F (36.6 C) (Oral)   Resp 18   SpO2 98%   Visual Acuity Right Eye Distance:   Left Eye Distance:   Bilateral Distance:    Right Eye Near:   Left Eye Near:    Bilateral Near:     Physical Exam Vitals and nursing note reviewed.  Constitutional:      General: She is not in acute distress.    Appearance: Normal appearance. She is not toxic-appearing.     Comments: Uncomfortable-appearing  HENT:     Mouth/Throat:     Mouth: Mucous membranes are moist.     Pharynx: Oropharynx is clear.  Pulmonary:     Effort: Pulmonary effort is normal. No respiratory distress.  Musculoskeletal:     Right shoulder: Tenderness and bony tenderness present. No swelling, deformity, effusion or crepitus. Normal range of motion. Normal strength. Normal pulse.     Left shoulder: Normal.     Right elbow: Normal. No swelling or effusion. Normal range of motion. No tenderness.     Left elbow: Normal.     Right forearm: Normal. No tenderness or bony tenderness.     Left forearm: Normal.     Right wrist: No bony tenderness or snuff box tenderness. Normal pulse.     Left wrist: Normal.     Cervical back: Normal range of motion. No rigidity or tenderness.     Comments: Inspection: no swelling, obvious deformity, or redness Palpation: right shoulder exquisitely tender to palpation diffusely no obvious deformities palpated ROM: Full ROM to right shoulder, although painful Strength: 5/5 bilateral upper extremities Neurovascular: neurovascularly intact in left and right upper extremity   Lymphadenopathy:     Cervical: No cervical adenopathy.  Skin:    General: Skin is warm and dry.     Capillary Refill: Capillary refill takes less than 2 seconds.     Coloration: Skin is not jaundiced or pale.     Findings: No erythema.  Neurological:     Mental Status: She is alert and oriented to person, place, and time.  Psychiatric:        Behavior: Behavior is cooperative.      UC Treatments /  Results  Labs (all labs ordered are listed, but only abnormal results are displayed) Labs Reviewed - No data to display  EKG   Radiology DG Shoulder Right  Result Date: 01/20/2022 CLINICAL DATA:  Fall EXAM: RIGHT SHOULDER - 4 VIEW COMPARISON:  None Available. FINDINGS: There is no evidence of fracture or dislocation. No evidence of arthropathy or other focal bone abnormality. Soft tissues are unremarkable. IMPRESSION: Negative. Electronically Signed   By: Allegra Lai M.D.   On: 01/20/2022 11:01    Procedures Procedures (including critical care time)  Medications Ordered in UC Medications  ketorolac (TORADOL) 30 MG/ML injection 30 mg (30 mg Intramuscular Given 01/20/22 1125)    Initial Impression / Assessment and Plan / UC Course  I have reviewed the triage vital signs and the nursing notes.  Pertinent labs & imaging results that were available during my care of the patient were reviewed by me and considered in my medical decision making (see chart for details).    Patient is a very pleasant, uncomfortable-appearing 51 year old female presenting for right shoulder pain today.  Shoulder x-rays negative for any acute bony fracture.  Low suspicion for rotator cuff tear given type of injury.  Treat pain today with Toradol 30 mg IM in urgent care.  Start ibuprofen  tomorrow alternating with Tylenol.  Use ice, rest, elevation as tolerated to help with pain.  Follow-up with orthopedic provider if pain not improved within 1 to 2 weeks.  The patient was given the opportunity to ask questions.  All questions answered to their satisfaction.  The patient is in agreement to this plan.   Final Clinical Impressions(s) / UC Diagnoses   Final diagnoses:  Acute pain of right shoulder     Discharge Instructions      - The shoulder x-ray today does not show any dislocation or fracture - The pain you are having is likely secondary to inflammation; we have given you a shot of Toradol to help treat  the pain.  Do not take any ibuprofen, naproxen, or Motrin today.  You can take Tylenol [phone] mg and use heat/ice to help with pain - If pain is not improving over next 1-2 weeks, follow up with Orthopedic provider; contact information is provided   ED Prescriptions     Medication Sig Dispense Auth. Provider   ibuprofen (ADVIL) 800 MG tablet Take 1 tablet (800 mg total) by mouth every 8 (eight) hours as needed for moderate pain. Take with food to prevent GI upset 21 tablet Valentino Nose, NP      I have reviewed the PDMP during this encounter.   Valentino Nose, NP 01/20/22 1201

## 2022-02-04 ENCOUNTER — Telehealth: Payer: Self-pay | Admitting: *Deleted

## 2022-02-04 NOTE — Chronic Care Management (AMB) (Signed)
  Care Coordination   Note   02/04/2022 Name: Kennedie Pardoe MRN: [id number] DOB: 12-Jul-1970  Maloni Arena is a 51 y.o. year old female who sees Medicine, [facility] for primary care. I reached out to [employer] by phone today to offer care coordination services.  Ms. Islam was given information about Care Coordination services today including:   The Care Coordination services include support from the care team which includes your Nurse Coordinator, Clinical Social Worker, or Pharmacist.  The Care Coordination team is here to help remove barriers to the health concerns and goals most important to you. Care Coordination services are voluntary, and the patient may decline or stop services at any time by request to their care team member.   Care Coordination Consent Status: Patient agreed to services and verbal consent obtained.   Follow up plan:  Telephone appointment with care coordination team member scheduled for:  02/05/22  Encounter Outcome:  Pt. Scheduled   [facility] Coordination Care Guide  Direct Dial: [phone]

## 2022-02-05 ENCOUNTER — Ambulatory Visit: Payer: Self-pay | Admitting: *Deleted

## 2022-02-05 NOTE — Patient Outreach (Signed)
  Care Coordination   02/05/2022 Name: [name] MRN: [id number] DOB: 1971/03/02   Care Coordination Outreach Attempts:  Contact was made with the patient today to offer care coordination services as a benefit of their health plan. The patient requested a return call on a later date.   Follow Up Plan:  Additional outreach attempts will be made to offer the patient care coordination information and services.   Encounter Outcome:  Pt. Request to Call Back  Care Coordination Interventions Activated:  No   Care Coordination Interventions:  No, not indicated    Kemper Durie, RN, MSN, [facility] Care Coordinator [phone]

## 2022-02-12 DIAGNOSIS — M25511 Pain in right shoulder: Secondary | ICD-10-CM | POA: Diagnosis not present

## 2022-02-12 DIAGNOSIS — M75101 Unspecified rotator cuff tear or rupture of right shoulder, not specified as traumatic: Secondary | ICD-10-CM | POA: Diagnosis not present

## 2022-02-12 DIAGNOSIS — Z6826 Body mass index (BMI) 26.0-26.9, adult: Secondary | ICD-10-CM | POA: Diagnosis not present

## 2022-02-25 DIAGNOSIS — M75101 Unspecified rotator cuff tear or rupture of right shoulder, not specified as traumatic: Secondary | ICD-10-CM | POA: Diagnosis not present

## 2022-02-25 DIAGNOSIS — M6289 Other specified disorders of muscle: Secondary | ICD-10-CM | POA: Diagnosis not present

## 2022-02-25 DIAGNOSIS — M25511 Pain in right shoulder: Secondary | ICD-10-CM | POA: Diagnosis not present

## 2022-02-25 DIAGNOSIS — W19XXXA Unspecified fall, initial encounter: Secondary | ICD-10-CM | POA: Diagnosis not present

## 2022-02-25 DIAGNOSIS — M19011 Primary osteoarthritis, right shoulder: Secondary | ICD-10-CM | POA: Diagnosis not present

## 2022-03-02 ENCOUNTER — Encounter: Payer: Self-pay | Admitting: *Deleted

## 2022-03-02 ENCOUNTER — Ambulatory Visit: Payer: Self-pay | Admitting: *Deleted

## 2022-03-02 NOTE — Patient Outreach (Signed)
  Care Coordination   Initial Visit Note   03/02/2022 Name: [name] MRN: [id number] DOB: 09/12/1970  [name] is a 51 y.o. year old female who sees Medicine, [facility] for primary care. I spoke with  Freddy Finner by phone today.  What matters to the patients health and wellness today?  Member report shoulder and knee pain that remains unmanaged.  She has seen ortho specialist 3 times in the last couple months, had MRI done, and has follow up scheduled.  Tramadol and muscle relaxer are not working, she will discuss with provider.  Denies any urgent concerns, encouraged to contact this care manager with questions.      Goals Addressed               This Visit's Progress     Pain relief (knee and shoulder) (pt-stated)        Care Coordination Interventions: Reviewed provider established plan for pain management Discussed importance of adherence to all scheduled medical appointments Counseled on the importance of reporting any/all new or changed pain symptoms or management strategies to pain management provider Advised patient to report to care team affect of pain on daily activities Reviewed with patient prescribed pharmacological and nonpharmacological pain relief strategies Assessed social determinant of health barriers Appointment with ortho on 9/14 to discuss MRI results and plan of care         SDOH assessments and interventions completed:  Yes  SDOH Interventions Today    Flowsheet Row Most Recent Value  SDOH Interventions   Food Insecurity Interventions Intervention Not Indicated  Housing Interventions Intervention Not Indicated  Transportation Interventions Intervention Not Indicated  Utilities Interventions Intervention Not Indicated        Care Coordination Interventions Activated:  Yes  Care Coordination Interventions:  Yes, provided   Follow up plan: Follow up call scheduled for 9/25    Encounter Outcome:  Pt. Visit Completed    Kemper Durie, RN, MSN, [facility] Care Management Care Management Coordinator [phone]

## 2022-03-02 NOTE — Patient Instructions (Signed)
Visit Information  Thank you for taking time to visit with me today. Please don't hesitate to contact me if I can be of assistance to you before our next scheduled telephone appointment.  Following are the goals we discussed today:  Keep appointment with ortho. Report ongoing uncontrolled pain to provider.   Our next appointment is by telephone on 9/25 at 330pm  Please call the care guide team at [phone] if you need to cancel or reschedule your appointment.   Please call the Suicide and Crisis Lifeline: 988 call the [place] National Suicide Prevention Lifeline: 916-194-5665 or TTY: 986-773-8009 TTY 5183253023) to talk to a trained counselor call 1-800-273-TALK (toll free, 24 hour hotline) call the [facility]: (570)633-0563 call 911 if you are experiencing a Mental Health or Behavioral Health Crisis or need someone to talk to.  Patient verbalizes understanding of instructions and care plan provided today and agrees to view in MyChart. Active MyChart status and patient understanding of how to access instructions and care plan via MyChart confirmed with patient.     The patient has been provided with contact information for the care management team and has been advised to call with any health related questions or concerns.    Kemper Durie, RN, MSN, [facility] Care Management Care Management Coordinator [phone]

## 2022-03-05 DIAGNOSIS — M75101 Unspecified rotator cuff tear or rupture of right shoulder, not specified as traumatic: Secondary | ICD-10-CM | POA: Diagnosis not present

## 2022-03-05 DIAGNOSIS — M19011 Primary osteoarthritis, right shoulder: Secondary | ICD-10-CM | POA: Diagnosis not present

## 2022-03-16 ENCOUNTER — Ambulatory Visit: Payer: Self-pay | Admitting: *Deleted

## 2022-03-16 NOTE — Patient Outreach (Signed)
  Care Coordination   Follow Up Visit Note   03/16/2022 Name: [name] MRN: [id number] DOB: May 18, 1971  [name] is a 51 y.o. year old female who sees Medicine, [facility] for primary care. I spoke with  [name] by phone today.  What matters to the patients health and wellness today?  Still having some pain, will get another shot in her shoulder on 10/2 and will have follow up for further intervention if pain has not decreased.  Denies any urgent concerns, encouraged to contact this care manager with questions.      Goals Addressed               This Visit's Progress     COMPLETED: Pain relief (knee and shoulder) (pt-stated)   On track     Care Coordination Interventions: Reviewed provider established plan for pain management Discussed importance of adherence to all scheduled medical appointments Counseled on the importance of reporting any/all new or changed pain symptoms or management strategies to pain management provider Advised patient to report to care team affect of pain on daily activities Reviewed with patient prescribed pharmacological and nonpharmacological pain relief strategies Assessed social determinant of health barriers MRI complete, confirmed follow up appointment with ortho 10/2         SDOH assessments and interventions completed:  No     Care Coordination Interventions Activated:  Yes  Care Coordination Interventions:  Yes, provided   Follow up plan: No further intervention required.   Encounter Outcome:  Pt. Visit Completed   Valente David, RN, MSN, [facility] Care Management Care Management Coordinator [phone]

## 2022-03-16 NOTE — Patient Instructions (Signed)
Visit Information  Thank you for taking time to visit with me today. Please don't hesitate to contact me if I can be of assistance to you.  Following are the goals we discussed today:  Contact this care coordinator with questions/concerns.  Please call the Suicide and Crisis Lifeline: 988 call the [place] National Suicide Prevention Lifeline: 1-800-273-8255 or TTY: 1-800-799-4 TTY (1-800-799-4889) to talk to a trained counselor call 1-800-273-TALK (toll free, 24 hour hotline) call the [place] Crisis Line: 800-939-9988 call 911 if you are experiencing a Mental Health or Behavioral Health Crisis or need someone to talk to.  Patient verbalizes understanding of instructions and care plan provided today and agrees to view in MyChart. Active MyChart status and patient understanding of how to access instructions and care plan via MyChart confirmed with patient.     The patient has been provided with contact information for the care management team and has been advised to call with any health related questions or concerns.   Kaliyan Osbourn, RN, MSN, CCM [facility] Care Management Care Management Coordinator [phone]  

## 2022-03-23 DIAGNOSIS — M19011 Primary osteoarthritis, right shoulder: Secondary | ICD-10-CM | POA: Diagnosis not present

## 2022-03-23 DIAGNOSIS — M75101 Unspecified rotator cuff tear or rupture of right shoulder, not specified as traumatic: Secondary | ICD-10-CM | POA: Diagnosis not present

## 2022-03-23 DIAGNOSIS — Z6827 Body mass index (BMI) 27.0-27.9, adult: Secondary | ICD-10-CM | POA: Diagnosis not present

## 2022-04-08 DIAGNOSIS — E039 Hypothyroidism, unspecified: Secondary | ICD-10-CM | POA: Diagnosis not present

## 2022-04-08 DIAGNOSIS — G43009 Migraine without aura, not intractable, without status migrainosus: Secondary | ICD-10-CM | POA: Diagnosis not present

## 2022-04-08 DIAGNOSIS — I10 Essential (primary) hypertension: Secondary | ICD-10-CM | POA: Diagnosis not present

## 2022-04-08 DIAGNOSIS — Z7689 Persons encountering health services in other specified circumstances: Secondary | ICD-10-CM | POA: Diagnosis not present

## 2022-04-08 DIAGNOSIS — R7303 Prediabetes: Secondary | ICD-10-CM | POA: Diagnosis not present

## 2022-04-09 ENCOUNTER — Other Ambulatory Visit (HOSPITAL_COMMUNITY): Payer: Self-pay | Admitting: Nurse Practitioner

## 2022-04-09 DIAGNOSIS — N6001 Solitary cyst of right breast: Secondary | ICD-10-CM

## 2022-04-09 DIAGNOSIS — Z1231 Encounter for screening mammogram for malignant neoplasm of breast: Secondary | ICD-10-CM

## 2022-04-21 DIAGNOSIS — Z01818 Encounter for other preprocedural examination: Secondary | ICD-10-CM | POA: Diagnosis not present

## 2022-04-28 ENCOUNTER — Ambulatory Visit (HOSPITAL_COMMUNITY): Admission: RE | Admit: 2022-04-28 | Payer: BC Managed Care – PPO | Source: Ambulatory Visit

## 2022-04-28 ENCOUNTER — Encounter (HOSPITAL_COMMUNITY): Payer: Self-pay

## 2022-04-28 ENCOUNTER — Inpatient Hospital Stay (HOSPITAL_COMMUNITY): Admission: RE | Admit: 2022-04-28 | Payer: BC Managed Care – PPO | Source: Ambulatory Visit

## 2022-05-05 DIAGNOSIS — R7303 Prediabetes: Secondary | ICD-10-CM | POA: Diagnosis not present

## 2022-05-05 DIAGNOSIS — I10 Essential (primary) hypertension: Secondary | ICD-10-CM | POA: Diagnosis not present

## 2022-05-05 DIAGNOSIS — G43009 Migraine without aura, not intractable, without status migrainosus: Secondary | ICD-10-CM | POA: Diagnosis not present

## 2022-05-05 DIAGNOSIS — E039 Hypothyroidism, unspecified: Secondary | ICD-10-CM | POA: Diagnosis not present

## 2022-05-06 DIAGNOSIS — M2392 Unspecified internal derangement of left knee: Secondary | ICD-10-CM | POA: Diagnosis not present

## 2022-05-06 DIAGNOSIS — M19011 Primary osteoarthritis, right shoulder: Secondary | ICD-10-CM | POA: Diagnosis not present

## 2022-05-06 DIAGNOSIS — M75101 Unspecified rotator cuff tear or rupture of right shoulder, not specified as traumatic: Secondary | ICD-10-CM | POA: Diagnosis not present

## 2022-05-11 DIAGNOSIS — M24811 Other specific joint derangements of right shoulder, not elsewhere classified: Secondary | ICD-10-CM | POA: Diagnosis not present

## 2022-05-12 DIAGNOSIS — E039 Hypothyroidism, unspecified: Secondary | ICD-10-CM | POA: Diagnosis not present

## 2022-05-12 DIAGNOSIS — M24011 Loose body in right shoulder: Secondary | ICD-10-CM | POA: Diagnosis not present

## 2022-05-12 DIAGNOSIS — M25519 Pain in unspecified shoulder: Secondary | ICD-10-CM | POA: Diagnosis not present

## 2022-05-12 DIAGNOSIS — M24811 Other specific joint derangements of right shoulder, not elsewhere classified: Secondary | ICD-10-CM | POA: Diagnosis not present

## 2022-05-12 DIAGNOSIS — M19011 Primary osteoarthritis, right shoulder: Secondary | ICD-10-CM | POA: Diagnosis not present

## 2022-05-12 DIAGNOSIS — Z885 Allergy status to narcotic agent status: Secondary | ICD-10-CM | POA: Diagnosis not present

## 2022-05-12 DIAGNOSIS — G8918 Other acute postprocedural pain: Secondary | ICD-10-CM | POA: Diagnosis not present

## 2022-05-12 DIAGNOSIS — E079 Disorder of thyroid, unspecified: Secondary | ICD-10-CM | POA: Diagnosis not present

## 2022-05-12 DIAGNOSIS — Z79899 Other long term (current) drug therapy: Secondary | ICD-10-CM | POA: Diagnosis not present

## 2022-05-12 DIAGNOSIS — Z87891 Personal history of nicotine dependence: Secondary | ICD-10-CM | POA: Diagnosis not present

## 2022-07-27 ENCOUNTER — Encounter (INDEPENDENT_AMBULATORY_CARE_PROVIDER_SITE_OTHER): Payer: Self-pay | Admitting: *Deleted

## 2022-08-18 DIAGNOSIS — E039 Hypothyroidism, unspecified: Secondary | ICD-10-CM | POA: Diagnosis not present

## 2022-08-18 DIAGNOSIS — I10 Essential (primary) hypertension: Secondary | ICD-10-CM | POA: Diagnosis not present

## 2022-09-01 DIAGNOSIS — R7301 Impaired fasting glucose: Secondary | ICD-10-CM | POA: Diagnosis not present

## 2022-09-01 DIAGNOSIS — I10 Essential (primary) hypertension: Secondary | ICD-10-CM | POA: Diagnosis not present

## 2022-09-01 DIAGNOSIS — E039 Hypothyroidism, unspecified: Secondary | ICD-10-CM | POA: Diagnosis not present

## 2022-09-01 DIAGNOSIS — G43009 Migraine without aura, not intractable, without status migrainosus: Secondary | ICD-10-CM | POA: Diagnosis not present

## 2022-09-01 DIAGNOSIS — F17200 Nicotine dependence, unspecified, uncomplicated: Secondary | ICD-10-CM | POA: Diagnosis not present

## 2022-10-11 DIAGNOSIS — M25551 Pain in right hip: Secondary | ICD-10-CM | POA: Diagnosis not present

## 2022-10-11 DIAGNOSIS — Z1159 Encounter for screening for other viral diseases: Secondary | ICD-10-CM | POA: Diagnosis not present

## 2022-10-11 DIAGNOSIS — Z79899 Other long term (current) drug therapy: Secondary | ICD-10-CM | POA: Diagnosis not present

## 2022-10-11 DIAGNOSIS — M25552 Pain in left hip: Secondary | ICD-10-CM | POA: Diagnosis not present

## 2022-10-11 DIAGNOSIS — R5383 Other fatigue: Secondary | ICD-10-CM | POA: Diagnosis not present

## 2022-10-11 DIAGNOSIS — R350 Frequency of micturition: Secondary | ICD-10-CM | POA: Diagnosis not present

## 2022-10-11 DIAGNOSIS — M545 Low back pain, unspecified: Secondary | ICD-10-CM | POA: Diagnosis not present

## 2022-10-11 DIAGNOSIS — M129 Arthropathy, unspecified: Secondary | ICD-10-CM | POA: Diagnosis not present

## 2022-10-11 DIAGNOSIS — E559 Vitamin D deficiency, unspecified: Secondary | ICD-10-CM | POA: Diagnosis not present

## 2022-10-11 DIAGNOSIS — F17210 Nicotine dependence, cigarettes, uncomplicated: Secondary | ICD-10-CM | POA: Diagnosis not present

## 2022-10-11 DIAGNOSIS — Z131 Encounter for screening for diabetes mellitus: Secondary | ICD-10-CM | POA: Diagnosis not present

## 2022-10-16 DIAGNOSIS — Z79899 Other long term (current) drug therapy: Secondary | ICD-10-CM | POA: Diagnosis not present

## 2022-10-16 DIAGNOSIS — F17210 Nicotine dependence, cigarettes, uncomplicated: Secondary | ICD-10-CM | POA: Diagnosis not present

## 2022-10-16 DIAGNOSIS — Z1159 Encounter for screening for other viral diseases: Secondary | ICD-10-CM | POA: Diagnosis not present

## 2022-10-16 DIAGNOSIS — R5383 Other fatigue: Secondary | ICD-10-CM | POA: Diagnosis not present

## 2022-10-16 DIAGNOSIS — E7800 Pure hypercholesterolemia, unspecified: Secondary | ICD-10-CM | POA: Diagnosis not present

## 2022-10-16 DIAGNOSIS — E559 Vitamin D deficiency, unspecified: Secondary | ICD-10-CM | POA: Diagnosis not present

## 2022-10-16 DIAGNOSIS — Z131 Encounter for screening for diabetes mellitus: Secondary | ICD-10-CM | POA: Diagnosis not present

## 2022-10-16 DIAGNOSIS — M5441 Lumbago with sciatica, right side: Secondary | ICD-10-CM | POA: Diagnosis not present

## 2022-10-16 DIAGNOSIS — Z87891 Personal history of nicotine dependence: Secondary | ICD-10-CM | POA: Diagnosis not present

## 2022-10-16 DIAGNOSIS — G8929 Other chronic pain: Secondary | ICD-10-CM | POA: Diagnosis not present

## 2022-10-16 DIAGNOSIS — M129 Arthropathy, unspecified: Secondary | ICD-10-CM | POA: Diagnosis not present

## 2022-10-21 DIAGNOSIS — Z79899 Other long term (current) drug therapy: Secondary | ICD-10-CM | POA: Diagnosis not present

## 2022-10-24 DIAGNOSIS — Z79899 Other long term (current) drug therapy: Secondary | ICD-10-CM | POA: Diagnosis not present

## 2022-10-24 DIAGNOSIS — R030 Elevated blood-pressure reading, without diagnosis of hypertension: Secondary | ICD-10-CM | POA: Diagnosis not present

## 2022-10-24 DIAGNOSIS — G8929 Other chronic pain: Secondary | ICD-10-CM | POA: Diagnosis not present

## 2022-10-24 DIAGNOSIS — E039 Hypothyroidism, unspecified: Secondary | ICD-10-CM | POA: Diagnosis not present

## 2022-10-24 DIAGNOSIS — F17210 Nicotine dependence, cigarettes, uncomplicated: Secondary | ICD-10-CM | POA: Diagnosis not present

## 2022-10-24 DIAGNOSIS — E7800 Pure hypercholesterolemia, unspecified: Secondary | ICD-10-CM | POA: Diagnosis not present

## 2022-10-24 DIAGNOSIS — M5441 Lumbago with sciatica, right side: Secondary | ICD-10-CM | POA: Diagnosis not present

## 2022-10-29 DIAGNOSIS — Z79899 Other long term (current) drug therapy: Secondary | ICD-10-CM | POA: Diagnosis not present

## 2022-10-30 DIAGNOSIS — R030 Elevated blood-pressure reading, without diagnosis of hypertension: Secondary | ICD-10-CM | POA: Diagnosis not present

## 2022-10-30 DIAGNOSIS — G8929 Other chronic pain: Secondary | ICD-10-CM | POA: Diagnosis not present

## 2022-10-30 DIAGNOSIS — F17210 Nicotine dependence, cigarettes, uncomplicated: Secondary | ICD-10-CM | POA: Diagnosis not present

## 2022-10-30 DIAGNOSIS — Z79899 Other long term (current) drug therapy: Secondary | ICD-10-CM | POA: Diagnosis not present

## 2022-10-30 DIAGNOSIS — M5441 Lumbago with sciatica, right side: Secondary | ICD-10-CM | POA: Diagnosis not present

## 2022-11-02 DIAGNOSIS — Z87891 Personal history of nicotine dependence: Secondary | ICD-10-CM | POA: Diagnosis not present

## 2022-11-02 DIAGNOSIS — Z780 Asymptomatic menopausal state: Secondary | ICD-10-CM | POA: Diagnosis not present

## 2022-11-03 DIAGNOSIS — Z79899 Other long term (current) drug therapy: Secondary | ICD-10-CM | POA: Diagnosis not present

## 2022-11-06 DIAGNOSIS — Z1339 Encounter for screening examination for other mental health and behavioral disorders: Secondary | ICD-10-CM | POA: Diagnosis not present

## 2022-11-06 DIAGNOSIS — G8929 Other chronic pain: Secondary | ICD-10-CM | POA: Diagnosis not present

## 2022-11-06 DIAGNOSIS — M5441 Lumbago with sciatica, right side: Secondary | ICD-10-CM | POA: Diagnosis not present

## 2022-11-06 DIAGNOSIS — M8589 Other specified disorders of bone density and structure, multiple sites: Secondary | ICD-10-CM | POA: Diagnosis not present

## 2022-11-06 DIAGNOSIS — R030 Elevated blood-pressure reading, without diagnosis of hypertension: Secondary | ICD-10-CM | POA: Diagnosis not present

## 2022-11-06 DIAGNOSIS — Z1331 Encounter for screening for depression: Secondary | ICD-10-CM | POA: Diagnosis not present

## 2022-11-06 DIAGNOSIS — Z79899 Other long term (current) drug therapy: Secondary | ICD-10-CM | POA: Diagnosis not present

## 2022-11-06 DIAGNOSIS — F17210 Nicotine dependence, cigarettes, uncomplicated: Secondary | ICD-10-CM | POA: Diagnosis not present

## 2022-11-13 DIAGNOSIS — Z79899 Other long term (current) drug therapy: Secondary | ICD-10-CM | POA: Diagnosis not present

## 2022-11-20 DIAGNOSIS — M5441 Lumbago with sciatica, right side: Secondary | ICD-10-CM | POA: Diagnosis not present

## 2022-11-20 DIAGNOSIS — Z79899 Other long term (current) drug therapy: Secondary | ICD-10-CM | POA: Diagnosis not present

## 2022-11-20 DIAGNOSIS — G8929 Other chronic pain: Secondary | ICD-10-CM | POA: Diagnosis not present

## 2022-11-20 DIAGNOSIS — F17210 Nicotine dependence, cigarettes, uncomplicated: Secondary | ICD-10-CM | POA: Diagnosis not present

## 2022-11-20 DIAGNOSIS — M8589 Other specified disorders of bone density and structure, multiple sites: Secondary | ICD-10-CM | POA: Diagnosis not present

## 2022-12-04 DIAGNOSIS — R030 Elevated blood-pressure reading, without diagnosis of hypertension: Secondary | ICD-10-CM | POA: Diagnosis not present

## 2022-12-04 DIAGNOSIS — E7800 Pure hypercholesterolemia, unspecified: Secondary | ICD-10-CM | POA: Diagnosis not present

## 2022-12-04 DIAGNOSIS — G8929 Other chronic pain: Secondary | ICD-10-CM | POA: Diagnosis not present

## 2022-12-04 DIAGNOSIS — F17210 Nicotine dependence, cigarettes, uncomplicated: Secondary | ICD-10-CM | POA: Diagnosis not present

## 2022-12-04 DIAGNOSIS — M5441 Lumbago with sciatica, right side: Secondary | ICD-10-CM | POA: Diagnosis not present

## 2022-12-04 DIAGNOSIS — M8589 Other specified disorders of bone density and structure, multiple sites: Secondary | ICD-10-CM | POA: Diagnosis not present

## 2022-12-04 DIAGNOSIS — Z79899 Other long term (current) drug therapy: Secondary | ICD-10-CM | POA: Diagnosis not present

## 2022-12-07 DIAGNOSIS — Z79899 Other long term (current) drug therapy: Secondary | ICD-10-CM | POA: Diagnosis not present

## 2022-12-18 DIAGNOSIS — E7800 Pure hypercholesterolemia, unspecified: Secondary | ICD-10-CM | POA: Diagnosis not present

## 2022-12-18 DIAGNOSIS — M5441 Lumbago with sciatica, right side: Secondary | ICD-10-CM | POA: Diagnosis not present

## 2022-12-18 DIAGNOSIS — G8929 Other chronic pain: Secondary | ICD-10-CM | POA: Diagnosis not present

## 2022-12-18 DIAGNOSIS — Z79899 Other long term (current) drug therapy: Secondary | ICD-10-CM | POA: Diagnosis not present

## 2022-12-18 DIAGNOSIS — M8589 Other specified disorders of bone density and structure, multiple sites: Secondary | ICD-10-CM | POA: Diagnosis not present

## 2022-12-18 DIAGNOSIS — F17210 Nicotine dependence, cigarettes, uncomplicated: Secondary | ICD-10-CM | POA: Diagnosis not present

## 2023-01-16 DIAGNOSIS — G8929 Other chronic pain: Secondary | ICD-10-CM | POA: Diagnosis not present

## 2023-01-16 DIAGNOSIS — M8589 Other specified disorders of bone density and structure, multiple sites: Secondary | ICD-10-CM | POA: Diagnosis not present

## 2023-01-16 DIAGNOSIS — E7800 Pure hypercholesterolemia, unspecified: Secondary | ICD-10-CM | POA: Diagnosis not present

## 2023-01-16 DIAGNOSIS — M5441 Lumbago with sciatica, right side: Secondary | ICD-10-CM | POA: Diagnosis not present

## 2023-01-16 DIAGNOSIS — R030 Elevated blood-pressure reading, without diagnosis of hypertension: Secondary | ICD-10-CM | POA: Diagnosis not present

## 2023-01-16 DIAGNOSIS — F17210 Nicotine dependence, cigarettes, uncomplicated: Secondary | ICD-10-CM | POA: Diagnosis not present

## 2023-01-16 DIAGNOSIS — Z79899 Other long term (current) drug therapy: Secondary | ICD-10-CM | POA: Diagnosis not present

## 2023-01-26 ENCOUNTER — Encounter (INDEPENDENT_AMBULATORY_CARE_PROVIDER_SITE_OTHER): Payer: Self-pay | Admitting: *Deleted

## 2023-02-01 ENCOUNTER — Ambulatory Visit: Payer: BC Managed Care – PPO

## 2023-02-17 DIAGNOSIS — G8929 Other chronic pain: Secondary | ICD-10-CM | POA: Diagnosis not present

## 2023-02-17 DIAGNOSIS — Z79899 Other long term (current) drug therapy: Secondary | ICD-10-CM | POA: Diagnosis not present

## 2023-02-17 DIAGNOSIS — M5441 Lumbago with sciatica, right side: Secondary | ICD-10-CM | POA: Diagnosis not present

## 2023-02-17 DIAGNOSIS — Z6826 Body mass index (BMI) 26.0-26.9, adult: Secondary | ICD-10-CM | POA: Diagnosis not present

## 2023-02-17 DIAGNOSIS — F17210 Nicotine dependence, cigarettes, uncomplicated: Secondary | ICD-10-CM | POA: Diagnosis not present

## 2023-02-17 DIAGNOSIS — R030 Elevated blood-pressure reading, without diagnosis of hypertension: Secondary | ICD-10-CM | POA: Diagnosis not present

## 2023-03-19 DIAGNOSIS — E039 Hypothyroidism, unspecified: Secondary | ICD-10-CM | POA: Diagnosis not present

## 2023-03-19 DIAGNOSIS — Z79899 Other long term (current) drug therapy: Secondary | ICD-10-CM | POA: Diagnosis not present

## 2023-03-19 DIAGNOSIS — Z6826 Body mass index (BMI) 26.0-26.9, adult: Secondary | ICD-10-CM | POA: Diagnosis not present

## 2023-03-19 DIAGNOSIS — M8589 Other specified disorders of bone density and structure, multiple sites: Secondary | ICD-10-CM | POA: Diagnosis not present

## 2023-03-19 DIAGNOSIS — M5136 Other intervertebral disc degeneration, lumbar region: Secondary | ICD-10-CM | POA: Diagnosis not present

## 2023-03-19 DIAGNOSIS — F17210 Nicotine dependence, cigarettes, uncomplicated: Secondary | ICD-10-CM | POA: Diagnosis not present

## 2023-03-19 DIAGNOSIS — M5441 Lumbago with sciatica, right side: Secondary | ICD-10-CM | POA: Diagnosis not present

## 2023-03-19 DIAGNOSIS — E559 Vitamin D deficiency, unspecified: Secondary | ICD-10-CM | POA: Diagnosis not present

## 2023-03-19 DIAGNOSIS — E7800 Pure hypercholesterolemia, unspecified: Secondary | ICD-10-CM | POA: Diagnosis not present

## 2023-03-25 DIAGNOSIS — K5730 Diverticulosis of large intestine without perforation or abscess without bleeding: Secondary | ICD-10-CM | POA: Diagnosis not present

## 2023-03-25 DIAGNOSIS — Z1211 Encounter for screening for malignant neoplasm of colon: Secondary | ICD-10-CM | POA: Diagnosis not present

## 2023-04-16 DIAGNOSIS — Z6826 Body mass index (BMI) 26.0-26.9, adult: Secondary | ICD-10-CM | POA: Diagnosis not present

## 2023-04-16 DIAGNOSIS — G8929 Other chronic pain: Secondary | ICD-10-CM | POA: Diagnosis not present

## 2023-04-16 DIAGNOSIS — M5441 Lumbago with sciatica, right side: Secondary | ICD-10-CM | POA: Diagnosis not present

## 2023-04-16 DIAGNOSIS — Z79899 Other long term (current) drug therapy: Secondary | ICD-10-CM | POA: Diagnosis not present

## 2023-04-16 DIAGNOSIS — J018 Other acute sinusitis: Secondary | ICD-10-CM | POA: Diagnosis not present

## 2023-04-16 DIAGNOSIS — F17210 Nicotine dependence, cigarettes, uncomplicated: Secondary | ICD-10-CM | POA: Diagnosis not present

## 2023-04-16 DIAGNOSIS — M8589 Other specified disorders of bone density and structure, multiple sites: Secondary | ICD-10-CM | POA: Diagnosis not present

## 2023-04-16 DIAGNOSIS — E7800 Pure hypercholesterolemia, unspecified: Secondary | ICD-10-CM | POA: Diagnosis not present

## 2023-04-27 DIAGNOSIS — M51369 Other intervertebral disc degeneration, lumbar region without mention of lumbar back pain or lower extremity pain: Secondary | ICD-10-CM | POA: Diagnosis not present

## 2023-05-17 DIAGNOSIS — R030 Elevated blood-pressure reading, without diagnosis of hypertension: Secondary | ICD-10-CM | POA: Diagnosis not present

## 2023-05-17 DIAGNOSIS — G8929 Other chronic pain: Secondary | ICD-10-CM | POA: Diagnosis not present

## 2023-05-17 DIAGNOSIS — E7800 Pure hypercholesterolemia, unspecified: Secondary | ICD-10-CM | POA: Diagnosis not present

## 2023-05-17 DIAGNOSIS — T402X5A Adverse effect of other opioids, initial encounter: Secondary | ICD-10-CM | POA: Diagnosis not present

## 2023-05-17 DIAGNOSIS — M5441 Lumbago with sciatica, right side: Secondary | ICD-10-CM | POA: Diagnosis not present

## 2023-05-17 DIAGNOSIS — Z6826 Body mass index (BMI) 26.0-26.9, adult: Secondary | ICD-10-CM | POA: Diagnosis not present

## 2023-05-17 DIAGNOSIS — Z79899 Other long term (current) drug therapy: Secondary | ICD-10-CM | POA: Diagnosis not present

## 2023-05-17 DIAGNOSIS — F17210 Nicotine dependence, cigarettes, uncomplicated: Secondary | ICD-10-CM | POA: Diagnosis not present

## 2023-05-17 DIAGNOSIS — K5903 Drug induced constipation: Secondary | ICD-10-CM | POA: Diagnosis not present

## 2023-06-17 DIAGNOSIS — K5903 Drug induced constipation: Secondary | ICD-10-CM | POA: Diagnosis not present

## 2023-06-17 DIAGNOSIS — R030 Elevated blood-pressure reading, without diagnosis of hypertension: Secondary | ICD-10-CM | POA: Diagnosis not present

## 2023-06-17 DIAGNOSIS — G8929 Other chronic pain: Secondary | ICD-10-CM | POA: Diagnosis not present

## 2023-06-17 DIAGNOSIS — M5441 Lumbago with sciatica, right side: Secondary | ICD-10-CM | POA: Diagnosis not present

## 2023-06-17 DIAGNOSIS — T402X5A Adverse effect of other opioids, initial encounter: Secondary | ICD-10-CM | POA: Diagnosis not present

## 2023-06-17 DIAGNOSIS — Z6826 Body mass index (BMI) 26.0-26.9, adult: Secondary | ICD-10-CM | POA: Diagnosis not present

## 2023-06-17 DIAGNOSIS — Z79899 Other long term (current) drug therapy: Secondary | ICD-10-CM | POA: Diagnosis not present

## 2023-06-17 DIAGNOSIS — E7800 Pure hypercholesterolemia, unspecified: Secondary | ICD-10-CM | POA: Diagnosis not present

## 2023-06-17 DIAGNOSIS — F17210 Nicotine dependence, cigarettes, uncomplicated: Secondary | ICD-10-CM | POA: Diagnosis not present

## 2023-06-22 DIAGNOSIS — Z79899 Other long term (current) drug therapy: Secondary | ICD-10-CM | POA: Diagnosis not present
